# Patient Record
Sex: Female | Born: 1965
Health system: Southern US, Community
[De-identification: ages and names within clinical notes are randomized; demographics above are authoritative.]

## PROBLEM LIST (undated history)

## (undated) DIAGNOSIS — J45909 Unspecified asthma, uncomplicated: Secondary | ICD-10-CM

## (undated) DIAGNOSIS — R112 Nausea with vomiting, unspecified: Secondary | ICD-10-CM

## (undated) DIAGNOSIS — L509 Urticaria, unspecified: Secondary | ICD-10-CM

## (undated) DIAGNOSIS — R7303 Prediabetes: Secondary | ICD-10-CM

## (undated) DIAGNOSIS — M7672 Peroneal tendinitis, left leg: Secondary | ICD-10-CM

## (undated) DIAGNOSIS — J189 Pneumonia, unspecified organism: Secondary | ICD-10-CM

## (undated) DIAGNOSIS — Z9889 Other specified postprocedural states: Secondary | ICD-10-CM

## (undated) DIAGNOSIS — G2581 Restless legs syndrome: Secondary | ICD-10-CM

## (undated) DIAGNOSIS — J069 Acute upper respiratory infection, unspecified: Secondary | ICD-10-CM

## (undated) DIAGNOSIS — T783XXA Angioneurotic edema, initial encounter: Secondary | ICD-10-CM

## (undated) DIAGNOSIS — IMO0002 Reserved for concepts with insufficient information to code with codable children: Secondary | ICD-10-CM

## (undated) HISTORY — PX: ANKLE SURGERY: SHX546

## (undated) HISTORY — DX: Urticaria, unspecified: L50.9

## (undated) HISTORY — PX: JOINT REPLACEMENT: SHX530

## (undated) HISTORY — DX: Acute upper respiratory infection, unspecified: J06.9

## (undated) HISTORY — DX: Angioneurotic edema, initial encounter: T78.3XXA

## (undated) HISTORY — DX: Reserved for concepts with insufficient information to code with codable children: IMO0002

## (undated) HISTORY — DX: Unspecified asthma, uncomplicated: J45.909

## (undated) HISTORY — PX: LAPAROSCOPIC LYSIS OF ADHESIONS: SHX5905

## (undated) HISTORY — PX: TUBAL LIGATION: SHX77

## (undated) HISTORY — DX: Peroneal tendinitis, left leg: M76.72

## (undated) HISTORY — PX: HERNIA REPAIR: SHX51

## (undated) HISTORY — PX: BREAST EXCISIONAL BIOPSY: SUR124

---

## 2011-02-04 DIAGNOSIS — E782 Mixed hyperlipidemia: Secondary | ICD-10-CM

## 2011-02-04 HISTORY — DX: Mixed hyperlipidemia: E78.2

## 2019-06-05 HISTORY — PX: OTHER SURGICAL HISTORY: SHX169

## 2019-06-27 DIAGNOSIS — Z96652 Presence of left artificial knee joint: Secondary | ICD-10-CM | POA: Insufficient documentation

## 2019-06-27 DIAGNOSIS — M1712 Unilateral primary osteoarthritis, left knee: Secondary | ICD-10-CM | POA: Insufficient documentation

## 2019-06-27 HISTORY — DX: Presence of left artificial knee joint: Z96.652

## 2020-07-25 DIAGNOSIS — Z1231 Encounter for screening mammogram for malignant neoplasm of breast: Secondary | ICD-10-CM | POA: Diagnosis not present

## 2020-08-14 DIAGNOSIS — E782 Mixed hyperlipidemia: Secondary | ICD-10-CM | POA: Diagnosis not present

## 2020-08-14 DIAGNOSIS — E559 Vitamin D deficiency, unspecified: Secondary | ICD-10-CM | POA: Diagnosis not present

## 2020-08-20 DIAGNOSIS — E559 Vitamin D deficiency, unspecified: Secondary | ICD-10-CM | POA: Diagnosis not present

## 2020-08-20 DIAGNOSIS — G2581 Restless legs syndrome: Secondary | ICD-10-CM | POA: Diagnosis not present

## 2020-08-20 DIAGNOSIS — Z0000 Encounter for general adult medical examination without abnormal findings: Secondary | ICD-10-CM | POA: Diagnosis not present

## 2020-08-20 DIAGNOSIS — K279 Peptic ulcer, site unspecified, unspecified as acute or chronic, without hemorrhage or perforation: Secondary | ICD-10-CM | POA: Diagnosis not present

## 2020-08-20 DIAGNOSIS — J4520 Mild intermittent asthma, uncomplicated: Secondary | ICD-10-CM | POA: Diagnosis not present

## 2020-08-20 DIAGNOSIS — R300 Dysuria: Secondary | ICD-10-CM | POA: Diagnosis not present

## 2020-08-20 LAB — HM PAP SMEAR

## 2020-11-28 ENCOUNTER — Ambulatory Visit: Payer: BC Managed Care – PPO | Admitting: Family Medicine

## 2020-11-28 ENCOUNTER — Encounter: Payer: Self-pay | Admitting: Family Medicine

## 2020-11-28 ENCOUNTER — Other Ambulatory Visit: Payer: Self-pay

## 2020-11-28 VITALS — BP 122/84 | HR 90 | Temp 97.6°F | Ht 62.5 in | Wt 167.6 lb

## 2020-11-28 DIAGNOSIS — Z13220 Encounter for screening for lipoid disorders: Secondary | ICD-10-CM

## 2020-11-28 DIAGNOSIS — E559 Vitamin D deficiency, unspecified: Secondary | ICD-10-CM | POA: Insufficient documentation

## 2020-11-28 DIAGNOSIS — L821 Other seborrheic keratosis: Secondary | ICD-10-CM

## 2020-11-28 DIAGNOSIS — G2581 Restless legs syndrome: Secondary | ICD-10-CM

## 2020-11-28 DIAGNOSIS — N938 Other specified abnormal uterine and vaginal bleeding: Secondary | ICD-10-CM

## 2020-11-28 DIAGNOSIS — G4700 Insomnia, unspecified: Secondary | ICD-10-CM | POA: Diagnosis not present

## 2020-11-28 DIAGNOSIS — H01004 Unspecified blepharitis left upper eyelid: Secondary | ICD-10-CM | POA: Diagnosis not present

## 2020-11-28 DIAGNOSIS — K279 Peptic ulcer, site unspecified, unspecified as acute or chronic, without hemorrhage or perforation: Secondary | ICD-10-CM | POA: Insufficient documentation

## 2020-11-28 DIAGNOSIS — J4520 Mild intermittent asthma, uncomplicated: Secondary | ICD-10-CM

## 2020-11-28 DIAGNOSIS — M47816 Spondylosis without myelopathy or radiculopathy, lumbar region: Secondary | ICD-10-CM

## 2020-11-28 DIAGNOSIS — Z114 Encounter for screening for human immunodeficiency virus [HIV]: Secondary | ICD-10-CM

## 2020-11-28 HISTORY — DX: Restless legs syndrome: G25.81

## 2020-11-28 HISTORY — DX: Other specified abnormal uterine and vaginal bleeding: N93.8

## 2020-11-28 HISTORY — DX: Mild intermittent asthma, uncomplicated: J45.20

## 2020-11-28 HISTORY — DX: Other seborrheic keratosis: L82.1

## 2020-11-28 HISTORY — DX: Spondylosis without myelopathy or radiculopathy, lumbar region: M47.816

## 2020-11-28 HISTORY — DX: Vitamin D deficiency, unspecified: E55.9

## 2020-11-28 MED ORDER — PRAMIPEXOLE DIHYDROCHLORIDE 1 MG PO TABS
1.0000 mg | ORAL_TABLET | Freq: Every day | ORAL | 3 refills | Status: DC
Start: 2020-11-28 — End: 2021-11-27

## 2020-11-28 MED ORDER — TRAZODONE HCL 50 MG PO TABS: ORAL_TABLET | ORAL | 2 refills | Status: DC

## 2020-11-28 MED ORDER — ERYTHROMYCIN 5 MG/GM OP OINT: 1.0000 "application " | TOPICAL_OINTMENT | Freq: Every day | OPHTHALMIC | 2 refills | Status: DC

## 2020-11-28 NOTE — Patient Instructions (Signed)
Evening primrose oil capsules Soy products [place]

## 2020-11-28 NOTE — Progress Notes (Signed)
Theresa Gonzalez is a 55 y.o. female  Chief Complaint  Patient presents with  . Establish Care    Np here to manage medications.  Pt need refills     HPI: Theresa Gonzalez is a 55 y.o. female patient seen today to establish care with our office. Pt recently moved to the area. 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 She is in school for counseling. Married with 2 children and 7 grandchildren. She has RLS and is on mirapex. She needs a refill of this. Med is effective at controlling symptoms. No side effects.  She also has issues with staying asleep. Unrelated to legs. She wakes up for 2 hrs in the middle of the night. Unisom, melatonin, benadryl not effective.    History reviewed. No pertinent past medical history.  Past Surgical History:  Procedure Laterality Date  . HERNIA REPAIR    . knee replacement Left 06/2019  . TUBAL LIGATION      Social History   Socioeconomic History  . Marital status: Married    Spouse name: Not on file  . Number of children: Not on file  . Years of education: Not on file  . Highest education level: Not on file  Occupational History  . Not on file  Tobacco Use  . Smoking status: Never Smoker  . Smokeless tobacco: Never Used  Vaping Use  . Vaping Use: Never used  Substance and Sexual Activity  . Alcohol use: Yes  . Drug use: Never  . Sexual activity: Not on file  Other Topics Concern  . Not on file  Social History Narrative  . Not on file   Social Determinants of Health   Financial Resource Strain: Not on file  Food Insecurity: Not on file  Transportation Needs: Not on file  Physical Activity: Not on file  Stress: Not on file  Social Connections: Not on file  Intimate Partner Violence: Not on file    Family History  Problem Relation Age of Onset  . Diabetes Father   . Prostate cancer Father   . Diabetes Brother   . Heart attack Maternal Grandmother   . Heart attack Maternal Grandfather        There is no immunization history on file for this patient.  Outpatient Encounter Medications as of 11/28/2020  Medication Sig  . albuterol (VENTOLIN HFA) 108 (90 Base) MCG/ACT inhaler Inhale into the lungs.  . clindamycin (CLEOCIN) 300 MG capsule SMARTSIG:2 Capsule(s) By Mouth  . cyclobenzaprine (FLEXERIL) 10 MG tablet Take by mouth.  . EPINEPHrine 0.3 mg/0.3 mL IJ SOAJ injection Inject into the muscle.  Marland Kitchen LANSOPRAZOLE PO 15 mg.  . pramipexole (MIRAPEX) 1 MG tablet Take by mouth.  . triamcinolone cream (KENALOG) 0.1 % Apply topically 2 (two) times daily.   No facility-administered encounter medications on file as of 11/28/2020.     ROS: Pertinent positives and negatives noted in HPI. Remainder of ROS non-contributory   Allergies  Allergen Reactions  . Penicillins Rash and Shortness Of Breath  . Potassium Citrate Itching  . Ropinirole Shortness Of Breath  . Cetirizine Hcl   . Nabumetone Rash  . Nsaids Rash    Provider: Valere Dross [facility] - Allergy Description: NSAIDs CFM - Allergy Annotation: rash  . Sodium Hyaluronate Hives    Pulse 90   Temp 97.6 F (36.4 C) (Temporal)   Ht 5' 2.5" (1.588 m)   Wt 167 lb 9.6 oz (76 kg)  LMP 09/15/2020   SpO2 98%   BMI 30.17 kg/m   Wt Readings from Last 3 Encounters:  11/28/20 167 lb 9.6 oz (76 kg)   Temp Readings from Last 3 Encounters:  11/28/20 97.6 F (36.4 C) (Temporal)   BP Readings from Last 3 Encounters:  No data found for BP   Pulse Readings from Last 3 Encounters:  11/28/20 90     Physical Exam Constitutional:      General: She is not in acute distress.    Appearance: Normal appearance. She is not ill-appearing.  Neurological:     Mental Status: She is alert and oriented to person, place, and time.  Psychiatric:        Mood and Affect: Mood normal.        Behavior: Behavior normal.      A/P:  1. RLS (restless legs syndrome) - x 20 years, symptoms controlled Refill: - pramipexole (MIRAPEX) 1 MG  tablet; Take 1 tablet (1 mg total) by mouth at bedtime.  Dispense: 90 tablet; Refill: 3  2. Insomnia, unspecified type - no improvement with unisom, melatonin, benadryl Rx: - traZODone (DESYREL) 50 MG tablet; 1-2 tabs po qHS  Dispense: 60 tablet; Refill: 2 - f/u in 3-4 wks if no/minimal improvement Discussed plan and reviewed medications with patient, including risks, benefits, and potential side effects. Pt expressed understand. All questions answered.  3. Screening for lipid disorders - Lipid panel; Future  4. Blepharitis of left upper eyelid, unspecified type - baby wash scrubs Rx: - erythromycin ophthalmic ointment; Place 1 application into the left eye at bedtime.  Dispense: 3.5 g; Refill: 2 - x 2-4 wks  5. Screening for HIV without presence of risk factors - HIV Antibody (routine testing w rflx); Future   This visit occurred during the SARS-CoV-2 public health emergency.  Safety protocols were in place, including screening questions prior to the visit, additional usage of staff PPE, and extensive cleaning of exam room while observing appropriate contact time as indicated for disinfecting solutions.

## 2020-12-03 ENCOUNTER — Encounter: Payer: Self-pay | Admitting: Family Medicine

## 2020-12-03 NOTE — Telephone Encounter (Signed)
Spoke to patient and she states that she got the Sh[place] vaccine 12/02/20 @ 3:00 pm at the pharmacy.  Then about 4 pm she started with a slight cough that has gotten a little deeper and a raxh on her chin/neck.  She has taken Benadryl with some relief.  Advised patient to as the pharmacy what their recommendations were and if symptoms get worse to please go to Urgent Care or ER to be checked out.  She is agreeable to this plan.  Any recommendations for her? Please review and advise.  Thanks.  Dm/cma

## 2020-12-05 ENCOUNTER — Other Ambulatory Visit: Payer: Self-pay

## 2020-12-05 ENCOUNTER — Other Ambulatory Visit (INDEPENDENT_AMBULATORY_CARE_PROVIDER_SITE_OTHER): Payer: BC Managed Care – PPO

## 2020-12-05 DIAGNOSIS — Z114 Encounter for screening for human immunodeficiency virus [HIV]: Secondary | ICD-10-CM | POA: Diagnosis not present

## 2020-12-05 DIAGNOSIS — Z13220 Encounter for screening for lipoid disorders: Secondary | ICD-10-CM | POA: Diagnosis not present

## 2020-12-05 NOTE — Progress Notes (Signed)
Per orders of Dr. Barron Alvine pt is her for labs pt tolerated draw well.

## 2020-12-08 LAB — LDL CHOLESTEROL, DIRECT: Direct LDL: 103 mg/dL

## 2020-12-08 LAB — HIV ANTIBODY (ROUTINE TESTING W REFLEX): HIV 1&2 Ab, 4th Generation: NONREACTIVE

## 2020-12-08 LAB — LIPID PANEL
Cholesterol: 211 mg/dL — ABNORMAL HIGH (ref 0–200)
HDL: 50.9 mg/dL (ref >39.00)
NonHDL: 160.25
Total CHOL/HDL Ratio: 4
Triglycerides: 256 mg/dL — ABNORMAL HIGH (ref 0.0–149.0)
VLDL: 51.2 mg/dL — ABNORMAL HIGH (ref 0.0–40.0)

## 2020-12-19 ENCOUNTER — Other Ambulatory Visit: Payer: Self-pay | Admitting: Family Medicine

## 2020-12-19 ENCOUNTER — Encounter: Payer: Self-pay | Admitting: Family Medicine

## 2020-12-19 DIAGNOSIS — E782 Mixed hyperlipidemia: Secondary | ICD-10-CM

## 2020-12-19 MED ORDER — FENOFIBRATE 145 MG PO TABS: 145.0000 mg | ORAL_TABLET | Freq: Every day | ORAL | 3 refills | Status: DC

## 2020-12-20 ENCOUNTER — Other Ambulatory Visit: Payer: Self-pay | Admitting: Family Medicine

## 2020-12-20 DIAGNOSIS — G4700 Insomnia, unspecified: Secondary | ICD-10-CM

## 2020-12-26 DIAGNOSIS — H0101B Ulcerative blepharitis left eye, upper and lower eyelids: Secondary | ICD-10-CM | POA: Diagnosis not present

## 2020-12-26 DIAGNOSIS — H0101A Ulcerative blepharitis right eye, upper and lower eyelids: Secondary | ICD-10-CM | POA: Diagnosis not present

## 2021-01-12 ENCOUNTER — Ambulatory Visit (INDEPENDENT_AMBULATORY_CARE_PROVIDER_SITE_OTHER): Payer: BC Managed Care – PPO | Admitting: Allergy & Immunology

## 2021-01-12 ENCOUNTER — Other Ambulatory Visit: Payer: Self-pay

## 2021-01-12 ENCOUNTER — Encounter: Payer: Self-pay | Admitting: Allergy & Immunology

## 2021-01-12 VITALS — BP 132/70 | HR 80 | Temp 98.0°F | Resp 16 | Ht 62.5 in | Wt 165.8 lb

## 2021-01-12 DIAGNOSIS — J4520 Mild intermittent asthma, uncomplicated: Secondary | ICD-10-CM | POA: Diagnosis not present

## 2021-01-12 DIAGNOSIS — L299 Pruritus, unspecified: Secondary | ICD-10-CM | POA: Diagnosis not present

## 2021-01-12 DIAGNOSIS — T7800XD Anaphylactic reaction due to unspecified food, subsequent encounter: Secondary | ICD-10-CM | POA: Diagnosis not present

## 2021-01-12 DIAGNOSIS — J310 Chronic rhinitis: Secondary | ICD-10-CM | POA: Diagnosis not present

## 2021-01-12 MED ORDER — LEVOCETIRIZINE DIHYDROCHLORIDE 5 MG PO TABS: 5.0000 mg | ORAL_TABLET | Freq: Every evening | ORAL | 3 refills | Status: DC

## 2021-01-12 NOTE — Progress Notes (Signed)
NEW PATIENT  Date of Service/Encounter:  01/12/21  Consult requested by: Overton Mam, DO   Assessment:   Mild intermittent asthma, uncomplicated   Chronic rhinitis   Anaphylactic shock due to food - with negative testing (confirming the peanut, tree nut, and seafood allergy history of blood work)  Pruritus - gave Vanicream samples  Plan/Recommendations:   1. Mild intermittent asthma, uncomplicated - Lung testing look great today. - I do not think that you need a controller medication since you are not using your albuterol frequently. - Continue with albuterol 2 to 4 puffs every 4-6 hours as needed.  2. Chronic rhinitis - Testing today showed: negative to the entire panel - Copy of test results provided.  - Avoidance measures provided. - Start taking: Xyzal (levocetirizine) 5mg  tablet once daily (this will help with your itching as well)  - You can use an extra dose of the antihistamine, if needed, for breakthrough symptoms.   3. Anaphylactic shock due to food - Testing was negative to everything tested. - Copy of test results provided. - Because of the severity of your reactions to peanuts, tree nuts, and seafood, we are going to confirm these with blood work. - EpiPen is up-to-date. - We are also getting some labs to rule out weird causes of itching/hives. - We we will call you in 1 to 2 weeks with the results of the testing. - We have a a lot of pieces to put together before we come up with a good management plan.  She has a family reunion in [place].  - Samples of Vanicream provided today. - Recommended daily emollient use.   5. Return in about 4 weeks (around 02/09/2021).     This note in its entirety was forwarded to the Provider who requested this consultation.  Subjective:   Theresa Gonzalez is a 55 y.o. female presenting today for evaluation of  Chief Complaint  Patient presents with   Allergies    Allergy occurrences without  eating things she knows she's allergic to. Has an Epi pen    Theresa Gonzalez has a history of the following: Patient Active Problem List   Diagnosis Date Noted   DUB (dysfunctional uterine bleeding) 11/28/2020   Facet arthropathy, lumbar 11/28/2020   Mild intermittent asthma without complication 11/28/2020   PUD (peptic ulcer disease) 11/28/2020   RLS (restless legs syndrome) 11/28/2020   SK (seborrheic keratosis) 11/28/2020   Vitamin D insufficiency 11/28/2020   Localized osteoarthritis of left knee 06/27/2019   Mixed hyperlipidemia 02/04/2011    History obtained from: chart review and patient.  Theresa Gonzalez was referred by Ethelda Chick, DO.     Theresa Gonzalez is a 55 y.o. female presenting for an evaluation of possible food allergies .  She has a sore throat since she had a shingles shot around 4 months ago. She does her best to avoid peanuts, tree nuts, and any type of seafood. She has had issues over the last two months and she is suddenly coughing "up a lung". She has issues with steak resulting in coughing. On Sunday, she had strawberries and oatmeal dozens of times, but she went into a coughing fit. She treated with 3 Benadryls and Pepcids.   She does break out with a rash to shrimp. She does have an up to date EpiPen. Shrimp has been an issues for a number of years since 2012 or so. This is when everything "blew up". She had testing performed in 2008  or so. This was done in [place] and she does not remember the name of the  2012, she was in an airplane where peanut butter crackers were opened and she had a coughing fit. She has done her best to avoid peanuts and tree nuts. She has been reading labels etc. The smell itself can set her off. At this point, she even reacts to cross contamination episodes at restaurants. Only the shrimp causes hives, everything else is just coughing explosively. She does have some throat swelling that feels like sometime has pressure on her throat. She  never has stomach pain with this.   All of the testing thus far has been prick testing (only once in 2008 or so).  She never had lab work done.  She has several medication allergies as well.  These are mostly centered on urticaria.   Asthma/Respiratory Symptom History: She does have a history of asthma.  She has been well controlled for four years unless it rains a lot. She has been on controller medications but they have been expensive. She has been on Symbicort in the past. This is the only one that she has been on. She was on this around 2008 or so.  Her albuterol typically expires before she has to get another one. She has no nighttime coughing or wheezing noted.   Allergic Rhinitis Symptom History: She does have some allergic rhinitis symptoms. She has been told that she has allergies in her left eye. She saw an ophthalmologist who diagnosed it as belpharitis and treated with eye drops.   Otherwise, there is no history of other atopic diseases, including stinging insect allergies, eczema, or contact dermatitis. There is no significant infectious history. Vaccinations are up to date.    Past Medical History: Patient Active Problem List   Diagnosis Date Noted   DUB (dysfunctional uterine bleeding) 11/28/2020   Facet arthropathy, lumbar 11/28/2020   Mild intermittent asthma without complication 11/28/2020   PUD (peptic ulcer disease) 11/28/2020   RLS (restless legs syndrome) 11/28/2020   SK (seborrheic keratosis) 11/28/2020   Vitamin D insufficiency 11/28/2020   Localized osteoarthritis of left knee 06/27/2019   Mixed hyperlipidemia 02/04/2011    Medication List:  Allergies as of 01/12/2021       Reactions   Penicillins Rash, Shortness Of Breath   Potassium Citrate Itching   Ropinirole Shortness Of Breath   Cetirizine Hcl    Nabumetone Rash   Nsaids Rash   Provider: Valere Dross [facility] - Allergy Description: NSAIDs CFM - Allergy Annotation: rash   Sodium Hyaluronate Hives         Medication List        Accurate as of January 12, 2021 11:06 AM. If you have any questions, ask your nurse or doctor.          albuterol 108 (90 Base) MCG/ACT inhaler Commonly known as: VENTOLIN HFA Inhale into the lungs.   clindamycin 300 MG capsule Commonly known as: CLEOCIN SMARTSIG:2 Capsule(s) By Mouth   cyclobenzaprine 10 MG tablet Commonly known as: FLEXERIL Take by mouth.   EPINEPHrine 0.3 mg/0.3 mL Soaj injection Commonly known as: EPI-PEN Inject into the muscle.   erythromycin ophthalmic ointment Place 1 application into the left eye at bedtime.   fenofibrate 145 MG tablet Commonly known as: TRICOR Take 1 tablet (145 mg total) by mouth daily.   LANSOPRAZOLE PO 15 mg.   pramipexole 1 MG tablet Commonly known as: MIRAPEX Take 1 tablet (1 mg total) by mouth at  bedtime.   traZODone 50 MG tablet Commonly known as: DESYREL TAKE 1 TO 2 TABLETS BY MOUTH AT BEDTIME   triamcinolone cream 0.1 % Commonly known as: KENALOG Apply topically 2 (two) times daily.        Birth History: non-contributory  Developmental History: non-contributory  Past Surgical History: Past Surgical History:  Procedure Laterality Date   HERNIA REPAIR     knee replacement Left 06/2019   TUBAL LIGATION       Family History: Family History  Problem Relation Age of Onset   Diabetes Father    Prostate cancer Father    Diabetes Brother    Heart attack Maternal Grandmother    Heart attack Maternal Grandfather    Allergic rhinitis Neg Hx    Asthma Neg Hx    Eczema Neg Hx    Urticaria Neg Hx      Social History: Lucianne lives at home with her husband.  She lives in an apartment that was built in 1999.  They recently moved here from [facility].  There is carpeting throughout the home.  She does not have any mold or mildew damage in the home.  She has electric heating and central cooling.  There are no animals inside or outside of the home.  There are no dust  mite covers on the bedding.  There is no tobacco exposure.  She currently works as a Consulting civil engineer.  She is not exposed to fumes, chemicals, or dust.  She does not use a HEPA filter.  They do not live near an interstate or industrial area.   Review of Systems  Constitutional: Negative.  Negative for fever, malaise/fatigue and weight loss.  HENT: Negative.  Negative for congestion, ear discharge and ear pain.   Eyes:  Negative for pain, discharge and redness.  Respiratory:  Positive for cough. Negative for sputum production, shortness of breath and wheezing.   Cardiovascular: Negative.  Negative for chest pain and palpitations.  Gastrointestinal:  Negative for abdominal pain, heartburn, nausea and vomiting.  Skin: Negative.  Negative for itching and rash.  Neurological:  Negative for dizziness and headaches.  Endo/Heme/Allergies:  Positive for environmental allergies. Does not bruise/bleed easily.       Positive for food allergies.      Objective:   Blood pressure 132/70, pulse 80, temperature 98 F (36.7 C), temperature source Temporal, resp. rate 16, height 5' 2.5" (1.588 m), weight 165 lb 12.8 oz (75.2 kg), SpO2 98 %. Body mass index is 29.84 kg/m.   Physical Exam:   Physical Exam Constitutional:      Appearance: She is well-developed.  HENT:     Head: Normocephalic and atraumatic.     Right Ear: Tympanic membrane, ear canal and external ear normal. No drainage, swelling or tenderness. Tympanic membrane is not injected, scarred, erythematous, retracted or bulging.     Left Ear: Tympanic membrane, ear canal and external ear normal. No drainage, swelling or tenderness. Tympanic membrane is not injected, scarred, erythematous, retracted or bulging.     Nose: No nasal deformity, septal deviation, mucosal edema or rhinorrhea.     Right Turbinates: Enlarged and swollen.     Left Turbinates: Enlarged and swollen.     Right Sinus: No maxillary sinus tenderness or frontal sinus tenderness.      Left Sinus: No maxillary sinus tenderness or frontal sinus tenderness.     Mouth/Throat:     Mouth: Mucous membranes are not pale and not dry.  Pharynx: Uvula midline.  Eyes:     General:        Right eye: No discharge.        Left eye: No discharge.     Conjunctiva/sclera: Conjunctivae normal.     Right eye: Right conjunctiva is not injected. No chemosis.    Left eye: Left conjunctiva is not injected. No chemosis.    Pupils: Pupils are equal, round, and reactive to light.  Cardiovascular:     Rate and Rhythm: Normal rate and regular rhythm.     Heart sounds: Normal heart sounds.  Pulmonary:     Effort: Pulmonary effort is normal. No tachypnea, accessory muscle usage or respiratory distress.     Breath sounds: Normal breath sounds. No wheezing, rhonchi or rales.     Comments: Moving air well in all lung fields. Chest:     Chest wall: No tenderness.  Abdominal:     Tenderness: There is no abdominal tenderness. There is no guarding or rebound.  Lymphadenopathy:     Head:     Right side of head: No submandibular, tonsillar or occipital adenopathy.     Left side of head: No submandibular, tonsillar or occipital adenopathy.     Cervical: No cervical adenopathy.  Skin:    General: Skin is warm.     Capillary Refill: Capillary refill takes less than 2 seconds.     Coloration: Skin is not pale.     Findings: No abrasion, erythema, petechiae or rash. Rash is not papular, urticarial or vesicular.     Comments: Multiple moles over her entire body.  Neurological:     Mental Status: She is alert.  Psychiatric:        Behavior: Behavior is cooperative.     Diagnostic studies:   Spirometry: results normal (FEV1: 2.26L, FVC: 2.68L, FEV1/FVC: 84%).    Spirometry consistent with normal pattern.    Allergy Studies:     Airborne Adult Perc - 01/12/21 1033     Time Antigen Placed 1030    Allergen Manufacturer Greer    Location Back    Number of Test 59    Panel 1 Select     1. Control-Buffer 50% Glycerol Negative    2. Control-Histamine 1 mg/ml 2+    3. Albumin saline Negative    4. Bahia Negative    5. French Southern TerritoriesBermuda Negative    6. Johnson Negative    7. Kentucky Blue Negative    8. Meadow Fescue Negative    9. Perennial Rye Negative    10. Sweet Vernal Negative    11. Timothy Negative    12. Cocklebur Negative    13. Burweed Marshelder Negative    14. Ragweed, short Negative    15. Ragweed, Giant Negative    16. Plantain,  English Negative    17. Lamb's Quarters Negative    18. Sheep Sorrell Negative    19. Rough Pigweed Negative    20. Marsh Elder, Rough Negative    21. Mugwort, Common Negative    22. Ash mix Negative    23. Birch mix Negative    24. Beech American Negative    25. Box, Elder Negative    26. Cedar, red Negative    27. Cottonwood, Guinea-BissauEastern Negative    28. Elm mix Negative    29. Hickory Negative    30. Maple mix Negative    31. Oak, Guinea-Bissau[place]astern mix Negative    32. Pecan Pollen Negative    33. Beazer HomesPine mix  Negative    34. Sycamore Eastern Negative    35. Walnut, Black Pollen Negative    36. Alternaria alternata Negative    37. Cladosporium Herbarum Negative    38. Aspergillus mix Negative    39. Penicillium mix Negative    40. Bipolaris sorokiniana (Helminthosporium) Negative    41. Drechslera spicifera (Curvularia) Negative    42. Mucor plumbeus Negative    43. Fusarium moniliforme Negative    44. Aureobasidium pullulans (pullulara) Negative    45. Rhizopus oryzae Negative    46. Botrytis cinera Negative    47. Epicoccum nigrum Negative    48. Phoma betae Negative    49. Candida Albicans Negative    50. Trichophyton mentagrophytes Negative    51. Mite, D Farinae  5,000 AU/ml Negative    52. Mite, D Pteronyssinus  5,000 AU/ml Negative    53. Cat Hair 10,000 BAU/ml Negative    54.  Dog Epithelia Negative    55. Mixed Feathers Negative    56. Horse Epithelia Negative    57. Cockroach, German Negative    58. Mouse Negative    59.  Tobacco Leaf Negative             Food Adult Perc - 01/12/21 1000     Time Antigen Placed 1030    Allergen Manufacturer Greer    Location Back    Number of allergen test 32    1. Peanut Negative    8. Shellfish Mix Negative    9. Fish Mix Negative    10. Cashew Negative    11. Pecan Food Negative    12. Walnut Food Negative    13. Almond Negative    14. Hazelnut Negative    15. [place] nut Negative    16. Coconut Negative    17. Pistachio Negative    18. Catfish Negative    19. Bass Negative    20. Trout Negative    21. Tuna Negative    22. Salmon Negative    23. Flounder Negative    24. Codfish Negative    25. Shrimp Negative    26. Crab Negative    27. Lobster Negative    28. Oyster Negative    29. Scallops Negative    31. Oat  Negative    34. Rice Negative    37. Pork Negative    40. Beef Negative    41. Lamb Negative    42. Tomato Negative    59. Peach Negative    64. Chocolate/Cacao bean Negative    67. Cinnamon Negative             Allergy testing results were read and interpreted by myself, documented by clinical staff.         Malachi Bonds, MD [facility]

## 2021-01-12 NOTE — Patient Instructions (Addendum)
1. Mild intermittent asthma, uncomplicated - Lung testing look great today. - I do not think that you need a controller medication since you are not using your albuterol frequently. - Continue with albuterol 2 to 4 puffs every 4-6 hours as needed.  2. Chronic rhinitis - Testing today showed: negative to the entire panel - Copy of test results provided.  - Avoidance measures provided. - Start taking: Xyzal (levocetirizine) 5mg  tablet once daily (this will help with your itching as well)  - You can use an extra dose of the antihistamine, if needed, for breakthrough symptoms.   3. Anaphylactic shock due to food - Testing was negative to everything tested. - Copy of test results provided. - Because of the severity of your reactions to peanuts, tree nuts, and seafood, we are going to confirm these with blood work. - EpiPen is up-to-date. - We are also getting some labs to rule out weird causes of itching/hives. - We we will call you in 1 to 2 weeks with the results of the testing. - We have a a lot of pieces to put together before we come up with a good management plan.  4. Return in about 4 weeks (around 02/09/2021).    Please inform 04/11/2021 of any Emergency Department visits, hospitalizations, or changes in symptoms. Call us before going to the ED for breathing or allergy symptoms since we might be able to fit you in for a sick visit. Feel free to contact us anytime with any questions, problems, or concerns.  It was a pleasure to meet you today! You were an ABSOLUTE HOOT!   Websites that have reliable patient information: 1. American Academy of Asthma, Allergy, and Immunology: www.aaaai.org 2. Food Allergy Research and Education (FARE): foodallergy.org 3. Mothers of Asthmatics: http://www.asthmacommunitynetwork.org 4. American College of Allergy, Asthma, and Immunology: www.acaai.org   COVID-19 Vaccine Information can be found at:  [place] For questions related to vaccine distribution or appointments, please email [email or web address] or call [phone].   We realize that you might be concerned about having an allergic reaction to the COVID19 vaccines. To help with that concern, WE ARE OFFERING THE COVID19 VACCINES IN OUR OFFICE! Ask the front desk for dates!     "Like" [phone] on Facebook and Instagram for our latest updates!      A healthy democracy works best when [place] participate! Make sure you are registered to vote! If you have moved or changed any of your contact information, you will need to get this updated before voting!  In some cases, you MAY be able to register to vote online: [employer]

## 2021-01-14 ENCOUNTER — Encounter: Payer: Self-pay | Admitting: Allergy & Immunology

## 2021-01-23 LAB — CBC WITH DIFFERENTIAL
Basophils Absolute: 0.1 10*3/uL (ref 0.0–0.2)
Basos: 1 %
EOS (ABSOLUTE): 0.2 10*3/uL (ref 0.0–0.4)
Eos: 3 %
Hematocrit: 39.6 % (ref 34.0–46.6)
Hemoglobin: 13.6 g/dL (ref 11.1–15.9)
Immature Grans (Abs): 0 10*3/uL (ref 0.0–0.1)
Immature Granulocytes: 0 %
Lymphocytes Absolute: 1.7 10*3/uL (ref 0.7–3.1)
Lymphs: 23 %
MCH: 29.1 pg (ref 26.6–33.0)
MCHC: 34.3 g/dL (ref 31.5–35.7)
MCV: 85 fL (ref 79–97)
Monocytes Absolute: 0.5 10*3/uL (ref 0.1–0.9)
Monocytes: 6 %
Neutrophils Absolute: 5.1 10*3/uL (ref 1.4–7.0)
Neutrophils: 67 %
RBC: 4.67 x10E6/uL (ref 3.77–5.28)
RDW: 13.5 % (ref 11.7–15.4)
WBC: 7.6 10*3/uL (ref 3.4–10.8)

## 2021-01-23 LAB — ALLERGY PANEL 18, NUT MIX GROUP
Allergen Coconut IgE: <0.1 kU/L
F020-IgE Almond: <0.1 kU/L
F202-IgE Cashew Nut: <0.1 kU/L
Hazelnut (Filbert) IgE: <0.1 kU/L
Peanut IgE: <0.1 kU/L
Pecan Nut IgE: <0.1 kU/L
Sesame Seed IgE: <0.1 kU/L

## 2021-01-23 LAB — CHRONIC URTICARIA: cu index: 3.9 (ref <10)

## 2021-01-23 LAB — CMP14+EGFR
ALT: 18 IU/L (ref 0–32)
AST: 16 IU/L (ref 0–40)
Albumin/Globulin Ratio: 2.7 — ABNORMAL HIGH (ref 1.2–2.2)
Albumin: 4.6 g/dL (ref 3.8–4.9)
Alkaline Phosphatase: 76 IU/L (ref 44–121)
BUN/Creatinine Ratio: 8 — ABNORMAL LOW (ref 9–23)
BUN: 7 mg/dL (ref 6–24)
Bilirubin Total: 0.3 mg/dL (ref 0.0–1.2)
CO2: 24 mmol/L (ref 20–29)
Calcium: 9.6 mg/dL (ref 8.7–10.2)
Chloride: 100 mmol/L (ref 96–106)
Creatinine, Ser: 0.87 mg/dL (ref 0.57–1.00)
Globulin, Total: 1.7 g/dL (ref 1.5–4.5)
Glucose: 123 mg/dL — ABNORMAL HIGH (ref 65–99)
Potassium: 3.7 mmol/L (ref 3.5–5.2)
Sodium: 146 mmol/L — ABNORMAL HIGH (ref 134–144)
Total Protein: 6.3 g/dL (ref 6.0–8.5)
eGFR: 78 mL/min/{1.73_m2} (ref >59)

## 2021-01-23 LAB — ALLERGY PANEL 19, SEAFOOD GROUP
Allergen Salmon IgE: <0.1 kU/L
Catfish: <0.1 kU/L
Codfish IgE: <0.1 kU/L
F023-IgE Crab: <0.1 kU/L
F080-IgE Lobster: <0.1 kU/L
Shrimp IgE: <0.1 kU/L
Tuna: <0.1 kU/L

## 2021-01-23 LAB — SEDIMENTATION RATE: Sed Rate: 2 mm/hr (ref 0–40)

## 2021-01-23 LAB — C-REACTIVE PROTEIN: CRP: 1 mg/L (ref 0–10)

## 2021-01-23 LAB — TRYPTASE: Tryptase: 4.2 ug/L (ref 2.2–13.2)

## 2021-01-23 LAB — ANTINUCLEAR ANTIBODIES, IFA: ANA Titer 1: NEGATIVE

## 2021-02-06 ENCOUNTER — Ambulatory Visit: Payer: BC Managed Care – PPO

## 2021-02-06 ENCOUNTER — Ambulatory Visit
Admission: EM | Admit: 2021-02-06 | Discharge: 2021-02-06 | Disposition: A | Discharge status: Home / Self Care | Payer: BC Managed Care – PPO | Attending: Emergency Medicine | Admitting: Emergency Medicine

## 2021-02-06 ENCOUNTER — Other Ambulatory Visit: Payer: Self-pay

## 2021-02-06 DIAGNOSIS — R059 Cough, unspecified: Secondary | ICD-10-CM | POA: Diagnosis not present

## 2021-02-06 DIAGNOSIS — J4520 Mild intermittent asthma, uncomplicated: Secondary | ICD-10-CM

## 2021-02-06 MED ORDER — PREDNISONE 20 MG PO TABS: 40.0000 mg | ORAL_TABLET | Freq: Every day | ORAL | 0 refills | Status: AC

## 2021-02-06 MED ORDER — BENZONATATE 200 MG PO CAPS: 200.0000 mg | ORAL_CAPSULE | Freq: Three times a day (TID) | ORAL | 0 refills | Status: AC | PRN

## 2021-02-06 NOTE — ED Triage Notes (Signed)
Pt presents with a cough and states it feels like an asthmatic cough.   States she has done two at home COVID test that were negative. Pt states she was in and out of hot and the cold temperature.

## 2021-02-06 NOTE — ED Provider Notes (Signed)
[facility]    CSN: [id number] Arrival date & time: 02/06/21  0808      History   Chief Complaint Chief Complaint  Patient presents with   Appointment   Cough    HPI Theresa Gonzalez is a 55 y.o. female presenting today for evaluation of a cough.  Reports that she has had a cough for approximately 1 week.  Mild associated headache and rhinorrhea which is normal for her.  She does report COVID exposure, but reports 2 at home test negative on different days.  Reports cough feels like an asthma cough that she has noticed wheezing with cough, but denies any wheezing with breathing.  Reports that she has been in and out of varying temperatures recently and feels this often will flare her asthma.  Using albuterol inhaler.  HPI  Past Medical History:  Diagnosis Date   Angio-edema    Asthma    Recurrent upper respiratory infection (URI)    Urticaria     Patient Active Problem List   Diagnosis Date Noted   DUB (dysfunctional uterine bleeding) 11/28/2020   Facet arthropathy, lumbar 11/28/2020   Mild intermittent asthma without complication 11/28/2020   PUD (peptic ulcer disease) 11/28/2020   RLS (restless legs syndrome) 11/28/2020   SK (seborrheic keratosis) 11/28/2020   Vitamin D insufficiency 11/28/2020   Localized osteoarthritis of left knee 06/27/2019   Mixed hyperlipidemia 02/04/2011    Past Surgical History:  Procedure Laterality Date   HERNIA REPAIR     knee replacement Left 06/2019   TUBAL LIGATION      OB History   No obstetric history on file.      Home Medications    Prior to Admission medications   Medication Sig Start Date End Date Taking? Authorizing Provider  benzonatate (TESSALON) 200 MG capsule Take 1 capsule (200 mg total) by mouth 3 (three) times daily as needed for up to 7 days for cough. 02/06/21 02/13/21 Yes Menno Vanbergen C, PA-C  predniSONE (DELTASONE) 20 MG tablet Take 2 tablets (40 mg total) by mouth daily with breakfast for 5 days.  02/06/21 02/11/21 Yes Yachet Mattson C, PA-C  albuterol (VENTOLIN HFA) 108 (90 Base) MCG/ACT inhaler Inhale into the lungs. 08/20/20   [provider]  EPINEPHrine 0.3 mg/0.3 mL IJ SOAJ injection Inject into the muscle. 08/20/20   [provider]  fenofibrate (TRICOR) 145 MG tablet Take 1 tablet (145 mg total) by mouth daily. 12/19/20   Overton Mam, DO  LANSOPRAZOLE PO 15 mg. 06/22/19   [provider]  levocetirizine (XYZAL) 5 MG tablet Take 1 tablet (5 mg total) by mouth every evening. 01/12/21   Alfonse Spruce, MD  pramipexole (MIRAPEX) 1 MG tablet Take 1 tablet (1 mg total) by mouth at bedtime. 11/28/20   CiriglianoJearld Lesch, DO    Family History Family History  Problem Relation Age of Onset   Diabetes Father    Prostate cancer Father    Diabetes Brother    Heart attack Maternal Grandmother    Heart attack Maternal Grandfather    Allergic rhinitis Neg Hx    Asthma Neg Hx    Eczema Neg Hx    Urticaria Neg Hx     Social History Social History   Tobacco Use   Smoking status: Never   Smokeless tobacco: Never  Vaping Use   Vaping Use: Never used  Substance Use Topics   Alcohol use: Yes   Drug use: Never  Allergies   Penicillins, Potassium citrate, Ropinirole, Cetirizine hcl, Nabumetone, Nsaids, and Sodium hyaluronate   Review of Systems Review of Systems  Constitutional:  Negative for activity change, appetite change, chills, fatigue and fever.  HENT:  Positive for congestion, rhinorrhea and sore throat. Negative for ear pain, sinus pressure and trouble swallowing.   Eyes:  Negative for discharge and redness.  Respiratory:  Positive for cough and chest tightness. Negative for shortness of breath.   Cardiovascular:  Negative for chest pain.  Gastrointestinal:  Negative for abdominal pain, diarrhea, nausea and vomiting.  Musculoskeletal:  Negative for myalgias.  Skin:  Negative for rash.  Neurological:  Negative for dizziness,  light-headedness and headaches.    Physical Exam Triage Vital Signs ED Triage Vitals  Enc Vitals Group     BP      Pulse      Resp      Temp      Temp src      SpO2      Weight      Height      Head Circumference      Peak Flow      Pain Score      Pain Loc      Pain Edu?      Excl. in GC?    No data found.  Updated Vital Signs BP (!) 141/88 (BP Location: Right Arm)   Pulse 71   Temp 97.9 F (36.6 C) (Oral)   Resp 20   SpO2 96%  Recheck blood pressure and was 126/83 Visual Acuity Right Eye Distance:   Left Eye Distance:   Bilateral Distance:    Right Eye Near:   Left Eye Near:    Bilateral Near:     Physical Exam Vitals and nursing note reviewed.  Constitutional:      Appearance: She is well-developed.     Comments: No acute distress  HENT:     Head: Normocephalic and atraumatic.     Ears:     Comments: Bilateral ears without tenderness to palpation of external auricle, tragus and mastoid, EAC's without erythema or swelling, TM's with good bony landmarks and cone of light. Non erythematous.      Nose: Nose normal.     Mouth/Throat:     Comments: Oral mucosa pink and moist, no tonsillar enlargement or exudate. Posterior pharynx patent and nonerythematous, no uvula deviation or swelling. Normal phonation.   Eyes:     Conjunctiva/sclera: Conjunctivae normal.  Cardiovascular:     Rate and Rhythm: Normal rate.  Pulmonary:     Effort: Pulmonary effort is normal. No respiratory distress.     Comments: Breathing comfortably at rest, CTABL, no wheezing, rales or other adventitious sounds auscultated  Abdominal:     General: There is no distension.  Musculoskeletal:        General: Normal range of motion.     Cervical back: Neck supple.  Skin:    General: Skin is warm and dry.  Neurological:     Mental Status: She is alert and oriented to person, place, and time.     UC Treatments / Results  Labs (all labs ordered are listed, but only abnormal  results are displayed) Labs Reviewed  NOVEL CORONAVIRUS, NAA    EKG   Radiology No results found.  Procedures Procedures (including critical care time)  Medications Ordered in UC Medications - No data to display  Initial Impression / Assessment and Plan / UC Course  I have  reviewed the triage vital signs and the nursing notes.  Pertinent labs & imaging results that were available during my care of the patient were reviewed by me and considered in my medical decision making (see chart for details).    Cough/bronchospasm-COVID PCR pending for confirmation, treating with prednisone 40 mg x 5 days to treat for any underlying asthma flare along with continuing albuterol, continue symptomatic and supportive care of congestion and postnasal drainage, Tessalon for cough, deferring antibiotic therapy at this time.  Discussed strict return precautions. Patient verbalized understanding and is agreeable with plan.  Final Clinical Impressions(s) / UC Diagnoses   Final diagnoses:  Cough  Mild intermittent asthma without complication     Discharge Instructions      COVID PCR pending for confirmation Prednisone 40 mg daily x5 days-take with food and earlier in the day if possible, drink plenty of water Tessalon every 8 hours for cough Continued allergy medicines Albuterol inhaler 1 to 2 puffs every 4-6 hours as needed Follow-up if not improving or worsening     ED Prescriptions     Medication Sig Dispense Auth. Provider   benzonatate (TESSALON) 200 MG capsule Take 1 capsule (200 mg total) by mouth 3 (three) times daily as needed for up to 7 days for cough. 28 capsule Mylan Schwarz C, PA-C   predniSONE (DELTASONE) 20 MG tablet Take 2 tablets (40 mg total) by mouth daily with breakfast for 5 days. 10 tablet Momodou Consiglio, Austell C, PA-C      PDMP not reviewed this encounter.   Lew Dawes, [place] 02/06/21 [phone]

## 2021-02-06 NOTE — Discharge Instructions (Addendum)
COVID PCR pending for confirmation Prednisone 40 mg daily x5 days-take with food and earlier in the day if possible, drink plenty of water Tessalon every 8 hours for cough Continued allergy medicines Albuterol inhaler 1 to 2 puffs every 4-6 hours as needed Follow-up if not improving or worsening

## 2021-02-08 LAB — SARS-COV-2, NAA 2 DAY TAT

## 2021-02-08 LAB — NOVEL CORONAVIRUS, NAA: SARS-CoV-2, NAA: NOT DETECTED

## 2021-02-26 ENCOUNTER — Encounter: Payer: Self-pay | Admitting: Family Medicine

## 2021-02-26 ENCOUNTER — Ambulatory Visit: Payer: BC Managed Care – PPO | Admitting: Family

## 2021-02-26 ENCOUNTER — Other Ambulatory Visit: Payer: Self-pay | Admitting: Family Medicine

## 2021-02-26 ENCOUNTER — Other Ambulatory Visit: Payer: Self-pay

## 2021-02-26 ENCOUNTER — Ambulatory Visit (INDEPENDENT_AMBULATORY_CARE_PROVIDER_SITE_OTHER): Payer: BC Managed Care – PPO | Admitting: Family Medicine

## 2021-02-26 ENCOUNTER — Telehealth: Payer: Self-pay | Admitting: *Deleted

## 2021-02-26 VITALS — BP 124/76 | HR 82 | Temp 98.1°F | Resp 12

## 2021-02-26 DIAGNOSIS — T7800XA Anaphylactic reaction due to unspecified food, initial encounter: Secondary | ICD-10-CM | POA: Insufficient documentation

## 2021-02-26 DIAGNOSIS — J4520 Mild intermittent asthma, uncomplicated: Secondary | ICD-10-CM | POA: Diagnosis not present

## 2021-02-26 DIAGNOSIS — K219 Gastro-esophageal reflux disease without esophagitis: Secondary | ICD-10-CM | POA: Insufficient documentation

## 2021-02-26 DIAGNOSIS — J310 Chronic rhinitis: Secondary | ICD-10-CM

## 2021-02-26 DIAGNOSIS — T7800XD Anaphylactic reaction due to unspecified food, subsequent encounter: Secondary | ICD-10-CM

## 2021-02-26 HISTORY — DX: Chronic rhinitis: J31.0

## 2021-02-26 HISTORY — DX: Gastro-esophageal reflux disease without esophagitis: K21.9

## 2021-02-26 MED ORDER — FAMOTIDINE 20 MG PO TABS: ORAL_TABLET | ORAL | 1 refills | Status: DC

## 2021-02-26 MED ORDER — FLUTICASONE PROPIONATE 50 MCG/ACT NA SUSP: NASAL | 5 refills | Status: DC

## 2021-02-26 MED ORDER — DEXLANSOPRAZOLE 60 MG PO CPDR: DELAYED_RELEASE_CAPSULE | ORAL | 1 refills | Status: DC

## 2021-02-26 NOTE — Telephone Encounter (Signed)
PA has been submitted through CoverMyMeds for Dexilant 60 and is currently pending approval/denial.

## 2021-02-26 NOTE — Patient Instructions (Addendum)
Asthma Continue albuterol 2 puffs once every 4 hours as needed for cough or wheeze You may use albuterol 2 puffs 5 to 15 minutes before if any to decrease cough and wheeze  Chronic rhinitis Continue an antihistamine once a day as needed for runny nose or itch. Remember to rotate to a different antihistamine about every 3 months. Some examples of over the counter antihistamines include Zyrtec (cetirizine), Xyzal (levocetirizine), Allegra (fexofenadine), and Claritin (loratidine).  Begin Flonase 2 sprays in each nostril once a day as needed for stuffy nose. In the right nostril, point the applicator out toward the right ear. In the left nostril, point the applicator out toward the left ear Begin saline nasal rinses as needed for nasal symptoms. Use this before any medicated nasal sprays for best result  Reflux Continue dietary and lifestyle modifications as listed below Begin Dexilant 60 mg once a day. This will replace lansoprazole Begin famotidine 20 mg twice a day to control reflux  Food Continue to avoid all nuts and seafood for now. Your testing was negative. If you are interested in a food challenge let us know.  In case of an allergic reaction, take Benadryl 50 mg every 4 hours, and if life-threatening symptoms occur, inject with EpiPen 0.3 mg. If your symptoms re-occur, begin a journal of events that occurred for up to 6 hours before your symptoms began including foods and beverages consumed, soaps or perfumes you had contact with, and medications.   Call the clinic if this treatment plan is not working well for you  Follow up in 1 month or sooner if needed.

## 2021-02-26 NOTE — Progress Notes (Signed)
[address] [place] [address] Dept: [phone]  FOLLOW UP NOTE  Patient ID: Theresa Gonzalez, female    DOB: 06-24-1966  Age: 55 y.o. MRN: [id number] Date of Office Visit: 02/26/2021  Assessment  Chief Complaint: Asthma and Food Intolerance  HPI Theresa Gonzalez is a 55 year old female who presents to the clinic for follow-up visit.  She was last seen in this clinic on 01/12/2021 for evaluation of asthma, chronic rhinitis, pruritus and food allergy to seafood and nuts.  In the interim, she presented to the emergency department for cough on 02/06/2021 at which time she had a negative COVID swab.  At today's visit, she reports that she continues to experience reactions that usually occur about 10 to 20 minutes after eating, however, they occur more frequently in the morning hours.  She reports symptoms begin with forceful dry cough occasionally leading to vomiting if she has just eaten.  In addition to cough, she reports feeling pressure in her throat. She notes that it feels like " someone pushing on both sides of her trachea".  She reports these have been occurring about once every 2 weeks and have been increasing in frequency over the last several months.  She takes Benadryl and her symptoms resolved in between 30 to 90 minutes.  She does not experience concomitant integumentary or gastrointestinal symptoms.  She denies pruritus.  Asthma is reported as moderately well controlled with shortness of breath with vigorous activity and occasional wheeze while having a coughing reaction.  She continues albuterol during the coughing reaction only with mild relief of symptoms.  Chronic rhinitis is reported as poorly controlled with clear rhinorrhea, nasal congestion, occasional sneeze, and copious postnasal drainage with frequent throat clearing.  She continues Xyzal 5 mg once a day and is not currently using Flonase or nasal saline rinses.  She did have negative environmental allergy testing at her last  visit to this clinic.  She reports reflux has been moderately well controlled with only occasional heartburn for which she takes lansoprazole 30 mg once a day.  She does report a history of poorly controlled reflux before beginning lansoprazole.  She continues to avoid fish, shellfish, peanuts, and tree nuts with no accidental ingestion or EpiPen use since her last visit to this clinic.  She did have negative skin testing and negative labs to shellfish, fish, peanuts, and tree nuts at her last visit to this clinic.  She did have a comprehensive work-up including largely negative lab work at her last visit to this clinic.  The alpha gal lab was not collected at that time.  She is also requesting thyroid lab evaluation.  She denies symptoms of thyroid disease at this time.  Her current medications are listed in the chart.   Drug Allergies:  Allergies  Allergen Reactions   Fish Allergy Cough, Hives and Shortness Of Breath   Peanut (Diagnostic) Cough and Shortness Of Breath   Penicillins Rash and Shortness Of Breath   Potassium Citrate Itching   Ropinirole Shortness Of Breath   Shrimp (Diagnostic) Cough, Hives and Shortness Of Breath   Cetirizine Hcl    Nabumetone Rash   Nsaids Rash    Provider: Valere Dross CFM - Allergy Description: NSAIDs CFM - Allergy Annotation: rash   Sodium Hyaluronate Hives   Other     Tree nuts causes a really bad cough    Physical Exam: BP 124/76 (BP Location: Right Arm, Patient Position: Sitting, Cuff Size: Normal)   Pulse 82  Temp 98.1 F (36.7 C) (Temporal)   Resp 12   SpO2 98%    Physical Exam Vitals reviewed.  Constitutional:      Appearance: Normal appearance.  HENT:     Head: Normocephalic and atraumatic.     Right Ear: Tympanic membrane normal.     Left Ear: Tympanic membrane normal.     Nose:     Comments: Bilateral nares edematous and pale with clear nasal drainage noted.  Pharynx is slightly erythematous with cobblestone appearance.  Ears  normal.  Eyes normal. Eyes:     Conjunctiva/sclera: Conjunctivae normal.  Cardiovascular:     Rate and Rhythm: Normal rate and regular rhythm.     Heart sounds: Normal heart sounds. No murmur heard. Pulmonary:     Effort: Pulmonary effort is normal.     Breath sounds: Normal breath sounds.     Comments: Lungs clear to auscultation Musculoskeletal:        General: Normal range of motion.     Cervical back: Normal range of motion and neck supple.  Skin:    General: Skin is warm and dry.  Neurological:     Mental Status: She is alert and oriented to person, place, and time.  Psychiatric:        Mood and Affect: Mood normal.        Behavior: Behavior normal.        Thought Content: Thought content normal.        Judgment: Judgment normal.    Diagnostics: FVC 2.83, FEV1 2.32.  Predicted FVC 3.04, predicted FEV1 2.42.  Spirometry indicates normal ventilatory function.  Assessment and Plan: 1. Mild intermittent asthma, uncomplicated   2. Chronic rhinitis   3. Anaphylactic shock due to food, subsequent encounter   4. Gastroesophageal reflux disease, unspecified whether esophagitis present     Meds ordered this encounter  Medications   dexlansoprazole (DEXILANT) 60 MG capsule    Sig: Take 1 capsule once a day. This will replace lansoprazole    Dispense:  90 capsule    Refill:  1    Please dispense 90 day supply.   famotidine (PEPCID) 20 MG tablet    Sig: Take one tablet twice a day to control reflux    Dispense:  180 tablet    Refill:  1    Please dispense 90 day supply   fluticasone (FLONASE) 50 MCG/ACT nasal spray    Sig: 2 sprays per nostril once a day as needed for stuffy nose.    Dispense:  16 g    Refill:  5    Patient Instructions  Asthma Continue albuterol 2 puffs once every 4 hours as needed for cough or wheeze You may use albuterol 2 puffs 5 to 15 minutes before if any to decrease cough and wheeze  Chronic rhinitis Continue an antihistamine once a day as  needed for runny nose or itch. Remember to rotate to a different antihistamine about every 3 months. Some examples of over the counter antihistamines include Zyrtec (cetirizine), Xyzal (levocetirizine), Allegra (fexofenadine), and Claritin (loratidine).  Begin Flonase 2 sprays in each nostril once a day as needed for stuffy nose. In the right nostril, point the applicator out toward the right ear. In the left nostril, point the applicator out toward the left ear Begin saline nasal rinses as needed for nasal symptoms. Use this before any medicated nasal sprays for best result  Reflux Continue dietary and lifestyle modifications as listed below Begin Dexilant 60 mg once  a day. This will replace lansoprazole Begin famotidine 20 mg twice a day to control reflux  Food Continue to avoid all nuts and seafood for now. Your testing was negative. If you are interested in a food challenge let us know.  In case of an allergic reaction, take Benadryl 50 mg every 4 hours, and if life-threatening symptoms occur, inject with EpiPen 0.3 mg. If your symptoms re-occur, begin a journal of events that occurred for up to 6 hours before your symptoms began including foods and beverages consumed, soaps or perfumes you had contact with, and medications.   Call the clinic if this treatment plan is not working well for you  Follow up in 1 month or sooner if needed.   Return in about 4 weeks (around 03/26/2021), or if symptoms worsen or fail to improve.    Thank you for the opportunity to care for this patient.  Please do not hesitate to contact me with questions.  Thermon Leyland, FNP [facility]

## 2021-02-27 ENCOUNTER — Ambulatory Visit (INDEPENDENT_AMBULATORY_CARE_PROVIDER_SITE_OTHER): Payer: BC Managed Care – PPO

## 2021-02-27 ENCOUNTER — Encounter: Payer: Self-pay | Admitting: Family Medicine

## 2021-02-27 ENCOUNTER — Ambulatory Visit: Payer: BC Managed Care – PPO | Admitting: Family Medicine

## 2021-02-27 VITALS — HR 78 | Temp 97.8°F | Ht 62.5 in | Wt 165.4 lb

## 2021-02-27 DIAGNOSIS — Z23 Encounter for immunization: Secondary | ICD-10-CM | POA: Diagnosis not present

## 2021-02-27 DIAGNOSIS — J310 Chronic rhinitis: Secondary | ICD-10-CM | POA: Diagnosis not present

## 2021-02-27 DIAGNOSIS — R7303 Prediabetes: Secondary | ICD-10-CM

## 2021-02-27 DIAGNOSIS — M25571 Pain in right ankle and joints of right foot: Secondary | ICD-10-CM

## 2021-02-27 DIAGNOSIS — M8560 Other cyst of bone, unspecified site: Secondary | ICD-10-CM | POA: Insufficient documentation

## 2021-02-27 DIAGNOSIS — K219 Gastro-esophageal reflux disease without esophagitis: Secondary | ICD-10-CM

## 2021-02-27 DIAGNOSIS — G8929 Other chronic pain: Secondary | ICD-10-CM

## 2021-02-27 DIAGNOSIS — E782 Mixed hyperlipidemia: Secondary | ICD-10-CM | POA: Diagnosis not present

## 2021-02-27 DIAGNOSIS — R739 Hyperglycemia, unspecified: Secondary | ICD-10-CM | POA: Diagnosis not present

## 2021-02-27 HISTORY — DX: Prediabetes: R73.03

## 2021-02-27 LAB — HEMOGLOBIN A1C: Hgb A1c MFr Bld: 6 % (ref 4.6–6.5)

## 2021-02-27 IMAGING — DX DG ANKLE COMPLETE 3+V*R*
3 series · 3 of 3 positions shown · non-contrast
Comparison: None.

CLINICAL DATA: Chronic episodic right ankle pain.  Injury in [I6].

EXAM:
RIGHT ANKLE - COMPLETE 3+ VIEW

[ankle ap]
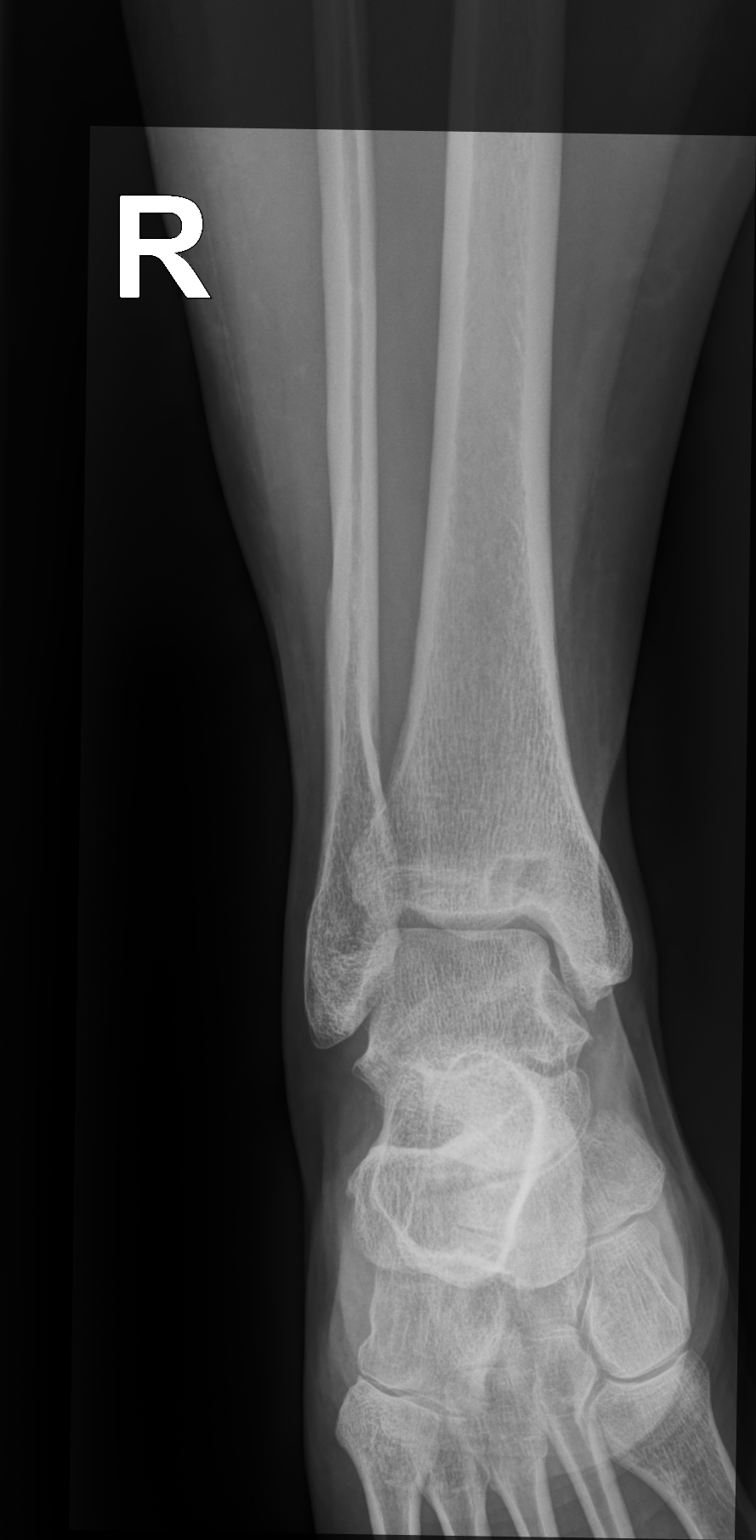

[ankle mlo]
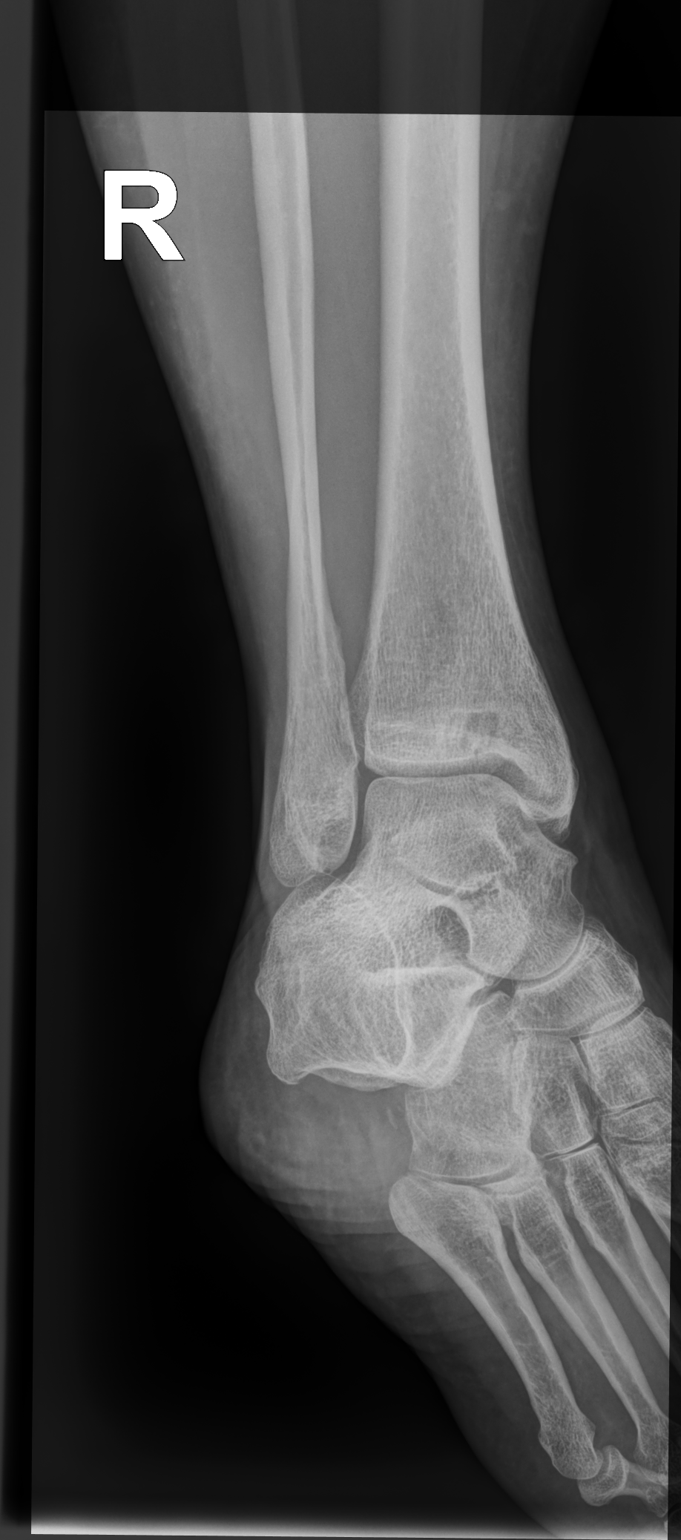

[ankle lat]
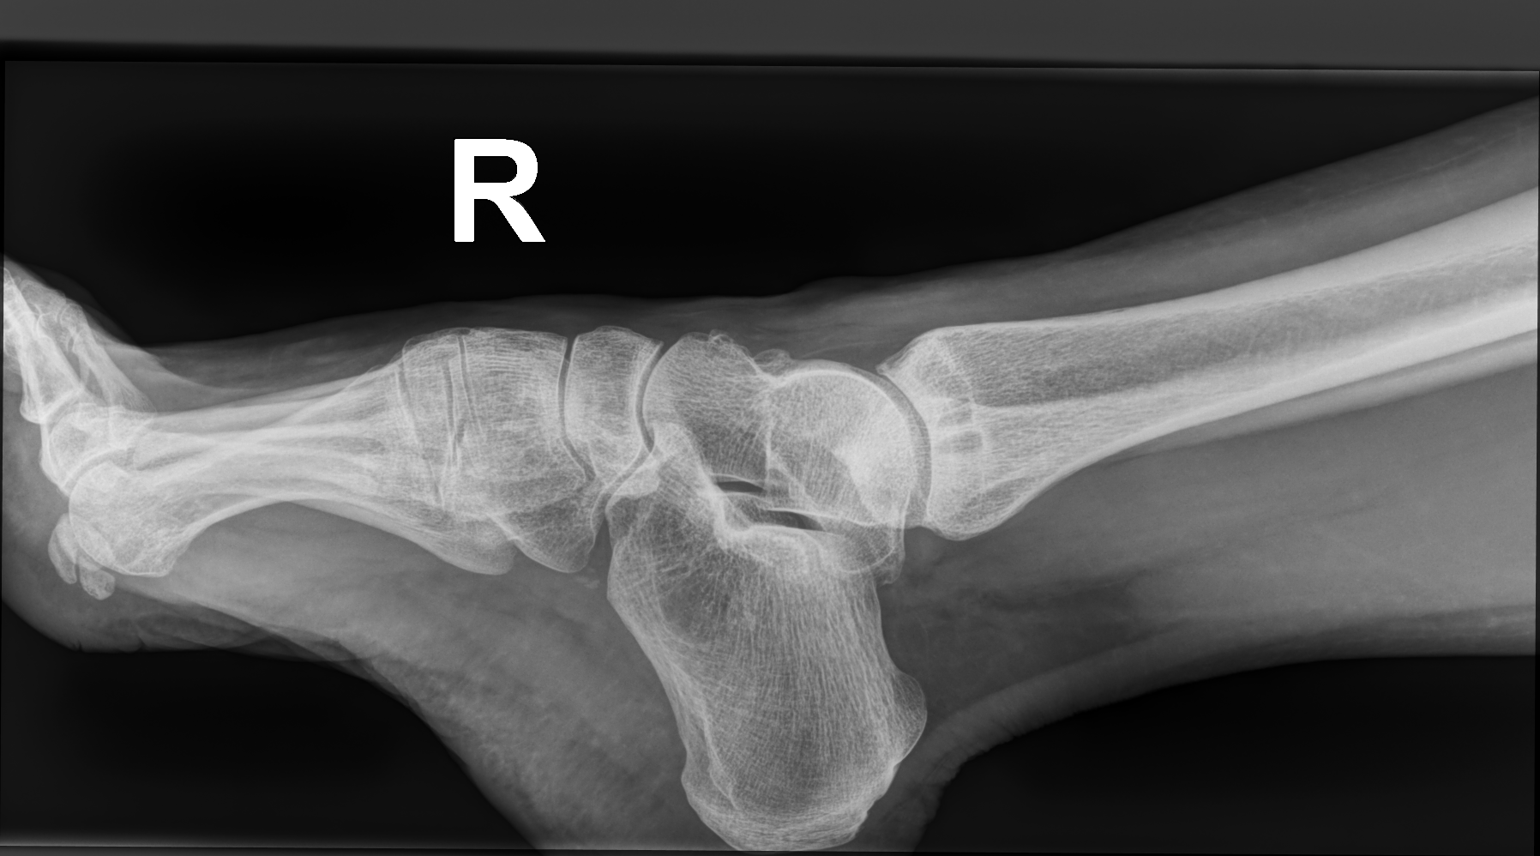

[3 of 3 positions shown; findings below may reference images not displayed]

FINDINGS: 1 cm peripherally sclerotic lucency in the medial aspect of the
tibial plafond. Smaller lucency in the medial aspect of the talar
dome. The ankle mortise is preserved. No evidence of acute or healed
fracture. Mild spurring of the dorsal talus in the region the
capsular insertion. Posterior subtalar joint is preserved. No
erosion or bone destruction. No ankle joint effusion. No focal soft
tissue abnormality.
IMPRESSION: 1. Peripherally sclerotic lucent lesions in the medial aspect of the
tibial plafond and smaller lucency in the medial aspect of the talar
dome. Findings may be subchondral cyst due to underlying chondral
defects, consider further assessment with MRI. These no aggressive
characteristics by radiograph.
2. Mild degenerative spurring of the dorsal talus in the region the
capsular insertion.

## 2021-02-27 NOTE — Telephone Encounter (Signed)
PA has been approved for Dexilant and has been sent electronically to the patient's pharmacy.

## 2021-02-27 NOTE — Progress Notes (Signed)
[facility] [address] [place] [address] Dept: [phone] Dept Fax: [phone]  Transfer of Care Office Visit  Subjective:    Patient ID: Theresa Gonzalez, female    DOB: 1965-12-26, 55 y.o..   MRN: [id number]  Chief Complaint  Patient presents with   Establish Care    TOC- f/u meds. C/o pain in RT ankle (fracture/ligament 2 years ago).      History of Present Illness:  Patient is in today to establish care. Theresa Gonzalez is from [facility]. [facility] [place], [place]). 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 Se has been married for 37 years. She has 2 children (36, 31) and 7 grandchildren. She denies tobacco or drug use and drinks alcohol rarely. 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  Theresa Gonzalez has a history of mild, intermittent asthma that is triggered by exposure to cold air. She very occasionally use an albuterol inhaler for this.  Theresa Gonzalez has a history of chronic rhinitis and some cough. She had though for quite some time that she had certain food allergies. However, she has very recently seen an allergist who has done testing and did not find evidence for theses food allergies. She notes the allergist has become concerned about chronic rhinitis and GERD as a possible cause for her symptoms. She is now on both Dexilant and Pepcid for GERD and fluticasone and Xyzal for rhinitis. This was just started in the past few days.  Theresa Gonzalez has a history of restless legs and is managed on Mirapex and trazodone with good results.  Theresa Gonzalez has a history of dyslipidemia, mainly with elevated triglycerides and is managed on Tricor.  Theresa Gonzalez notes that she injured her right ankle in ~ 2016, jumping into a pool. She had a hyperdorsiflexion of the ankle joint. She note sin more recent years,  she has periodic pain in the antero lateral aspect of the ankle, but also occasionally in the Achilles area.  Past Medical History: Patient Active Problem List   Diagnosis Date Noted   Hyperglycemia 02/27/2021   Chronic rhinitis 02/26/2021   Anaphylactic shock due to adverse food reaction 02/26/2021   Gastroesophageal reflux disease 02/26/2021   DUB (dysfunctional uterine bleeding) 11/28/2020   Facet arthropathy, lumbar 11/28/2020   Mild intermittent asthma, uncomplicated 11/28/2020   PUD (peptic ulcer disease) 11/28/2020   RLS (restless legs syndrome) 11/28/2020   SK (seborrheic keratosis) 11/28/2020   Vitamin D insufficiency 11/28/2020   Localized osteoarthritis of left knee 06/27/2019   Mixed hyperlipidemia 02/04/2011   Past Surgical History:  Procedure Laterality Date   HERNIA REPAIR Bilateral    knee replacement Left 06/2019   LAPAROSCOPIC LYSIS OF ADHESIONS     TUBAL LIGATION     Family History  Problem Relation Age of Onset   Hypertension Mother    Hyperlipidemia Mother    Retinitis pigmentosa Mother    Heart disease Father    Diabetes Father    Prostate cancer Father    Diabetes Brother    Retinitis pigmentosa Maternal Aunt    Heart attack Maternal Grandmother    Stroke Maternal Grandfather    Heart attack Maternal Grandfather    Heart disease Paternal Grandmother    Epilepsy Paternal Grandmother    Diabetes Paternal Grandfather    Allergic rhinitis Neg Hx    Asthma Neg Hx  Eczema Neg Hx    Urticaria Neg Hx    Outpatient Medications Prior to Visit  Medication Sig Dispense Refill   albuterol (VENTOLIN HFA) 108 (90 Base) MCG/ACT inhaler Inhale into the lungs.     dexlansoprazole (DEXILANT) 60 MG capsule Take 1 capsule once a day. This will replace lansoprazole 90 capsule 1   EPINEPHrine 0.3 mg/0.3 mL IJ SOAJ injection Inject into the muscle.     famotidine (PEPCID) 20 MG tablet Take one tablet twice a day to control reflux 180 tablet 1   fenofibrate  (TRICOR) 145 MG tablet Take 1 tablet (145 mg total) by mouth daily. 90 tablet 3   fluticasone (FLONASE) 50 MCG/ACT nasal spray 2 sprays per nostril once a day as needed for stuffy nose. 16 g 5   levocetirizine (XYZAL) 5 MG tablet Take 1 tablet (5 mg total) by mouth every evening. 30 tablet 3   pramipexole (MIRAPEX) 1 MG tablet Take 1 tablet (1 mg total) by mouth at bedtime. 90 tablet 3   traZODone (DESYREL) 50 MG tablet Take 100 mg by mouth at bedtime as needed.     No facility-administered medications prior to visit.   Allergies  Allergen Reactions   Fish Allergy Cough, Hives and Shortness Of Breath   Peanut (Diagnostic) Cough and Shortness Of Breath   Penicillins Rash and Shortness Of Breath   Potassium Citrate Itching   Ropinirole Shortness Of Breath   Shrimp (Diagnostic) Cough, Hives and Shortness Of Breath   Cetirizine Hcl    Nabumetone Rash   Nsaids Rash    Provider: Valere Dross CFM - Allergy Description: NSAIDs CFM - Allergy Annotation: rash   Sodium Hyaluronate Hives   Other     Tree nuts causes a really bad cough     Objective:   Today's Vitals   02/27/21 0858  Pulse: 78  Temp: 97.8 F (36.6 C)  TempSrc: Temporal  SpO2: 96%  Weight: 165 lb 6.4 oz (75 kg)  Height: 5' 2.5" (1.588 m)   Body mass index is 29.77 kg/m.   General: Well developed, well nourished. No acute distress. Psych: Alert and oriented. Normal mood and affect.  Health Maintenance Due  Topic Date Due   Pneumococcal Vaccine 33-43 Years old (1 - PCV) Never done   Hepatitis C Screening  Never done   INFLUENZA VACCINE  02/02/2021     Imaging: Right ankle x-ray- No fracture or osteophytes noted.  Lab results: Lab Results  Component Value Date   CHOL 211 (H) 12/05/2020   HDL 50.90 12/05/2020   LDLDIRECT 103.0 12/05/2020   TRIG 256.0 (H) 12/05/2020   CHOLHDL 4 12/05/2020   CMP Latest Ref Rng & Units 01/12/2021  Glucose 65 - 99 mg/dL 262(M)  BUN 6 - 24 mg/dL 7  Creatinine 3.55 - 9.74 mg/dL  1.63  Sodium 845 - 364 mmol/L 146(H)  Potassium 3.5 - 5.2 mmol/L 3.7  Chloride 96 - 106 mmol/L 100  CO2 20 - 29 mmol/L 24  Calcium 8.7 - 10.2 mg/dL 9.6  Total Protein 6.0 - 8.5 g/dL 6.3  Total Bilirubin 0.0 - 1.2 mg/dL 0.3  Alkaline Phos 44 - 121 IU/L 76  AST 0 - 40 IU/L 16  ALT 0 - 32 IU/L 18    Assessment & Plan:   1. Chronic pain of right ankle I offered a referral to physical therapy. Theresa Gonzalez would prefer to hold off on that for now. Despite her stated allergy to NSAIDs, she notes she does  tolerate ibuprofen. I suggested she might try taking this twice a day for a week, along with daily hot soaks with ROM exercises.  - DG Ankle Complete Right; Future  2. Chronic rhinitis Continue Xyzal and Flonase. She will follow up with her allergist. She will have lab work as ordered by Dr. Dellis Anes completed today.  3. Gastroesophageal reflux disease, unspecified whether esophagitis present Continue PPI and famotidine. Also, following with allergy.  4. Mixed hyperlipidemia On Tricor. We will follow up with fasting labs at her next visit.  5. Hyperglycemia Mild hyperglycemia noted on last CMP. I will check an HbA1c.  - Hemoglobin A1c  Loyola Mast, MD

## 2021-03-01 ENCOUNTER — Encounter: Payer: Self-pay | Admitting: Family Medicine

## 2021-03-01 DIAGNOSIS — M25571 Pain in right ankle and joints of right foot: Secondary | ICD-10-CM

## 2021-03-01 DIAGNOSIS — G8929 Other chronic pain: Secondary | ICD-10-CM

## 2021-03-02 DIAGNOSIS — T7800XD Anaphylactic reaction due to unspecified food, subsequent encounter: Secondary | ICD-10-CM | POA: Diagnosis not present

## 2021-03-02 NOTE — Telephone Encounter (Signed)
OK. Instead of Dexilant, lets try omeprazole 40 mg once a day. Take 30 minutes before your first meal. If no improvement suggest referral to GI specialist.

## 2021-03-06 LAB — ALPHA-GAL PANEL
Allergen Lamb IgE: <0.1 kU/L
Beef IgE: <0.1 kU/L
IgE (Immunoglobulin E), Serum: 9 IU/mL (ref 6–495)
O215-IgE Alpha-Gal: <0.1 kU/L
Pork IgE: <0.1 kU/L

## 2021-03-06 LAB — THYROID CASCADE PROFILE: TSH: 2.78 u[IU]/mL (ref 0.450–4.500)

## 2021-03-06 NOTE — Progress Notes (Signed)
Can you please let this patient know that her TSH was within the normal range and her alpha gal panel was negative (red meat allergy due to tick bite). Please find out how she is doing on the new acid reflux medication? Has her cough improved at all?

## 2021-03-06 NOTE — Progress Notes (Signed)
Thank you :)

## 2021-03-12 ENCOUNTER — Ambulatory Visit
Admission: RE | Admit: 2021-03-12 | Discharge: 2021-03-12 | Disposition: A | Discharge status: Home / Self Care | Payer: BC Managed Care – PPO | Source: Ambulatory Visit | Attending: Family Medicine | Admitting: Family Medicine

## 2021-03-12 ENCOUNTER — Other Ambulatory Visit: Payer: Self-pay

## 2021-03-12 DIAGNOSIS — G8929 Other chronic pain: Secondary | ICD-10-CM

## 2021-03-12 DIAGNOSIS — M19071 Primary osteoarthritis, right ankle and foot: Secondary | ICD-10-CM | POA: Diagnosis not present

## 2021-03-12 DIAGNOSIS — M25571 Pain in right ankle and joints of right foot: Secondary | ICD-10-CM

## 2021-03-12 DIAGNOSIS — R600 Localized edema: Secondary | ICD-10-CM | POA: Diagnosis not present

## 2021-03-12 IMAGING — MR MR ANKLE*R* W/O CM
4 of 6 series · 12 of 40 positions shown · non-contrast
Comparison: Radiograph [DATE]

CLINICAL DATA: Chronic ankle pain. Abnormal plain x-ray with
lucency in distal tibia.

EXAM:
MRI OF THE RIGHT ANKLE WITHOUT CONTRAST
TECHNIQUE: Multiplanar, multisequence MR imaging of the ankle was performed. No
intravenous contrast was administered.

[Series 3: PD fat-sat · axial · right · 3.0mm · 0.25mm/px · z∈[-19,+72]mm · 3 of 33 slices shown]
[im 5/33]
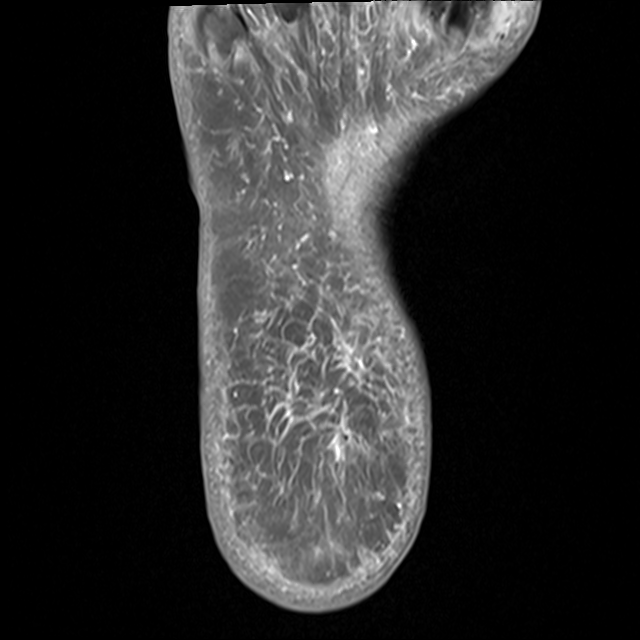
[im 19/33]
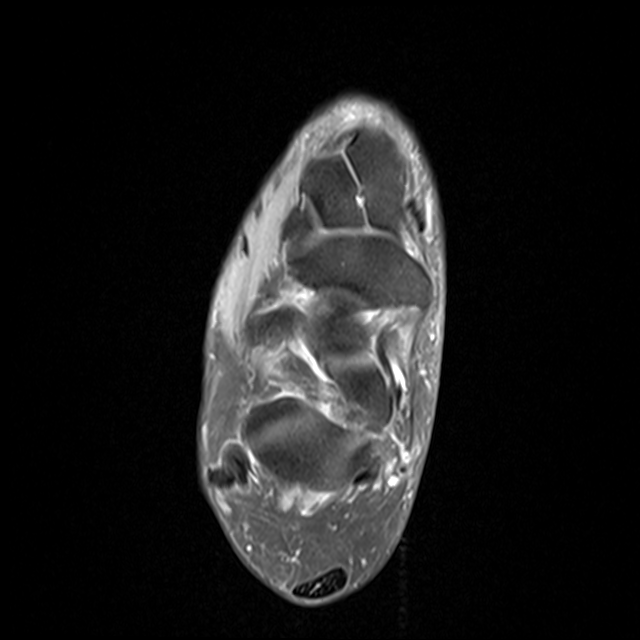
[im 28/33]
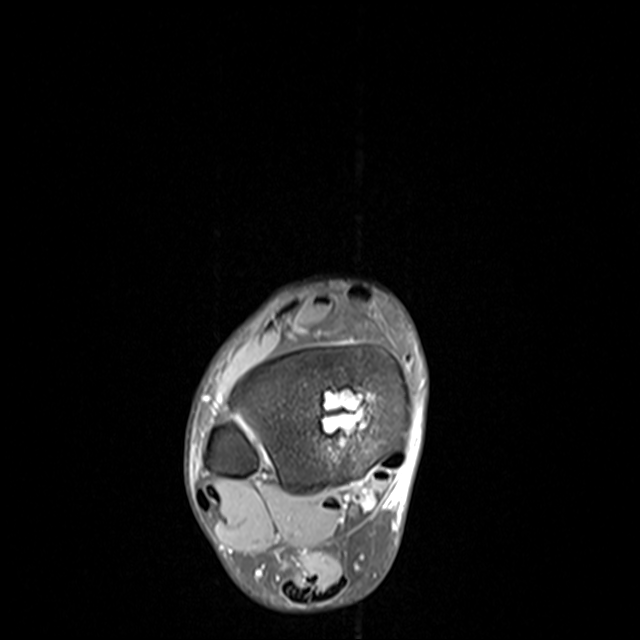

[Series 4: T2 fat-sat · axial · right · 3.0mm · 0.25mm/px · z∈[-19,+72]mm · 3 of 33 slices shown (1 of 2)]
[im 5/33]
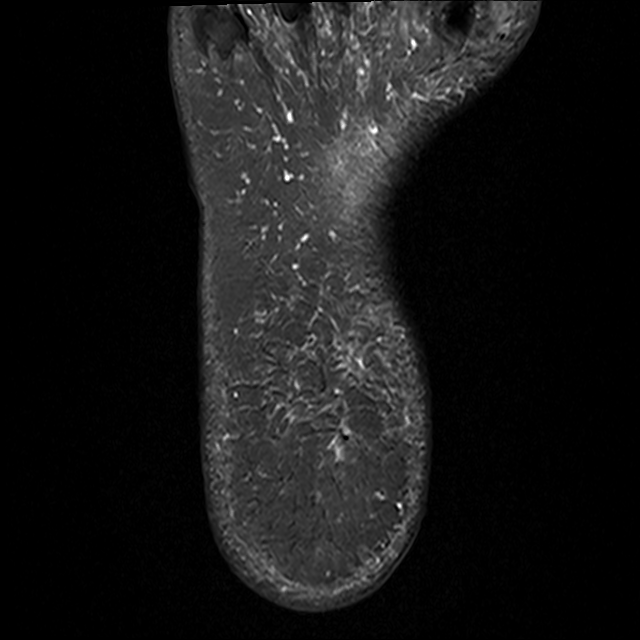
[im 19/33]
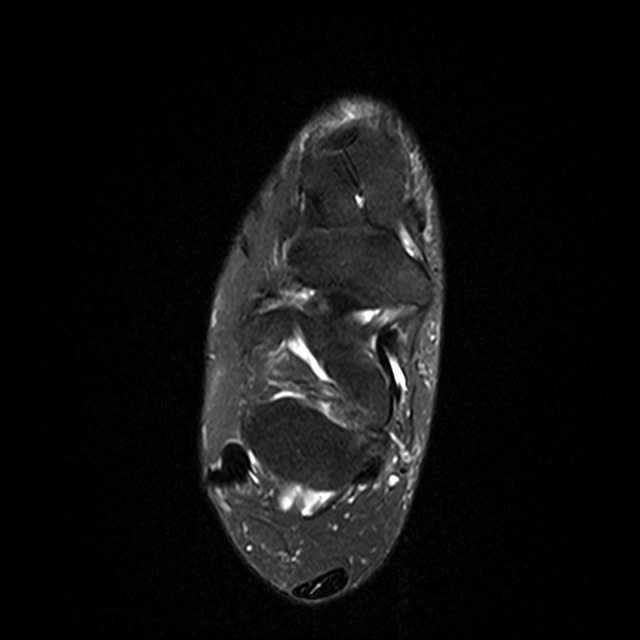
[im 28/33]
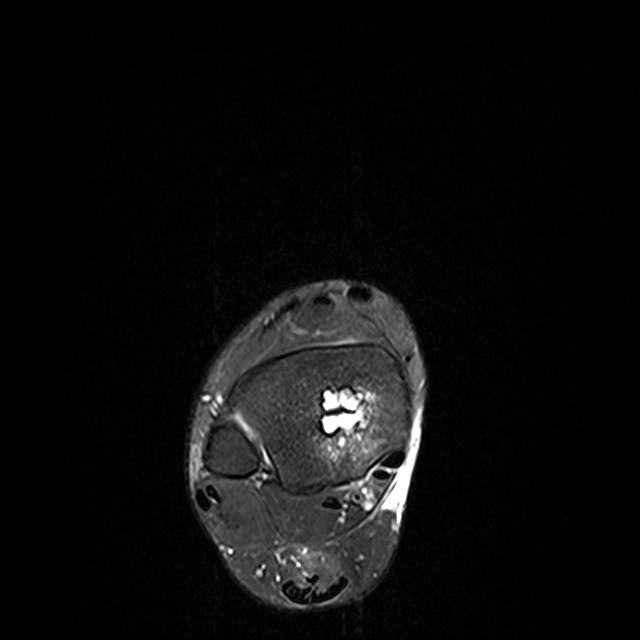

[Series 5: T1 · sagittal · right · 4.0mm · 0.27mm/px · 3 of 22 slices shown]
[im 1/22]
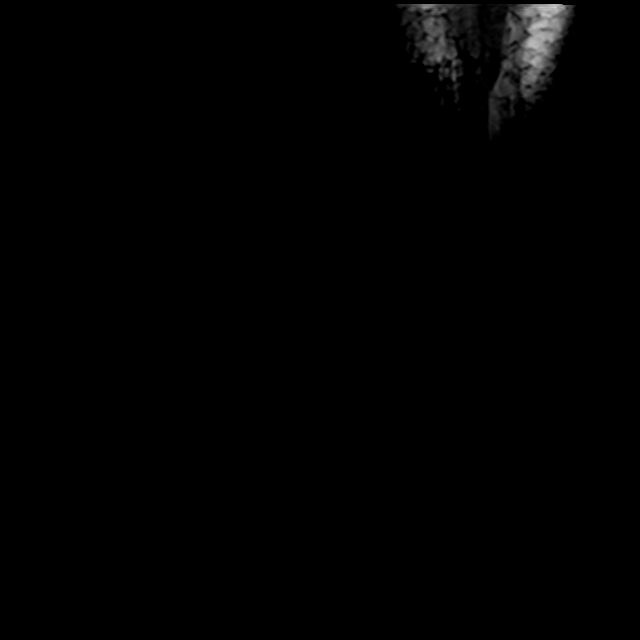
[im 11/22]
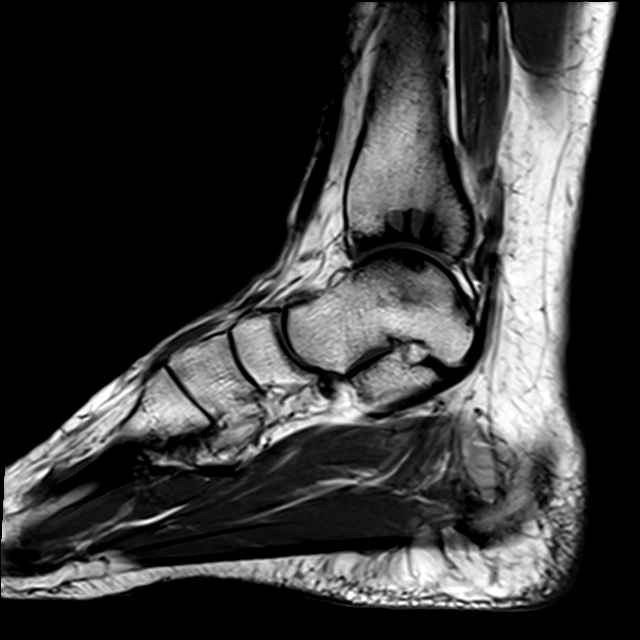
[im 22/22]
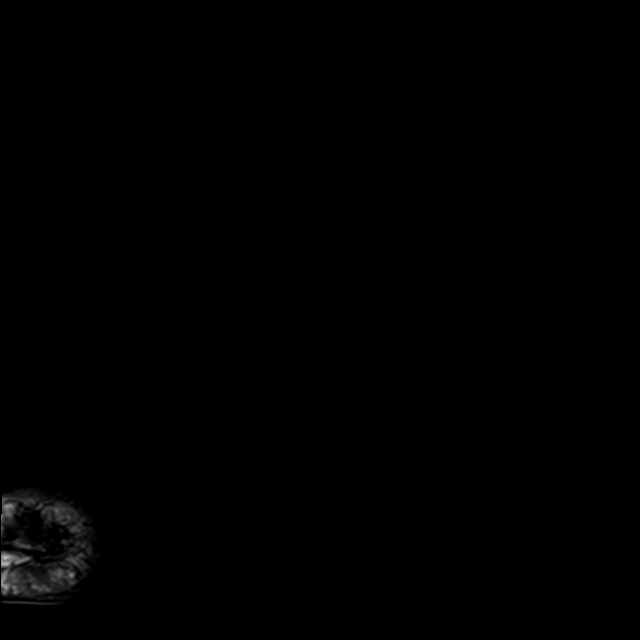

[Series 7: T2 fat-sat · coronal · right · 3.0mm · 0.25mm/px · 3 of 33 slices shown (2 of 2)]
[im 6/33]
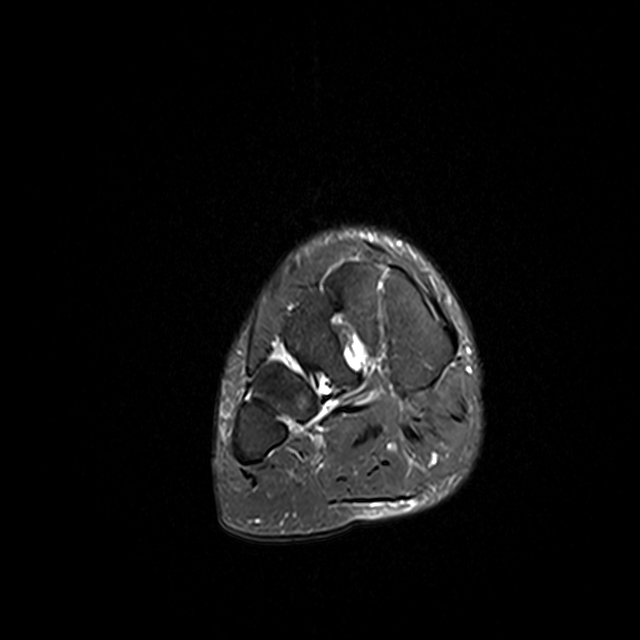
[im 17/33]
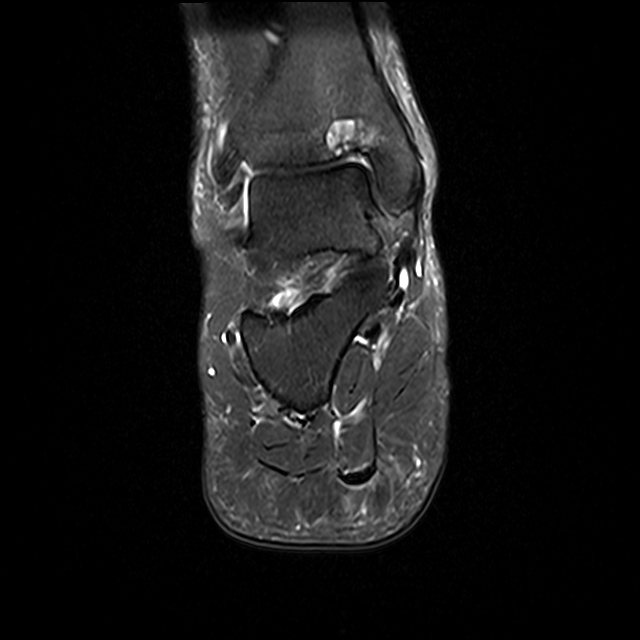
[im 27/33]
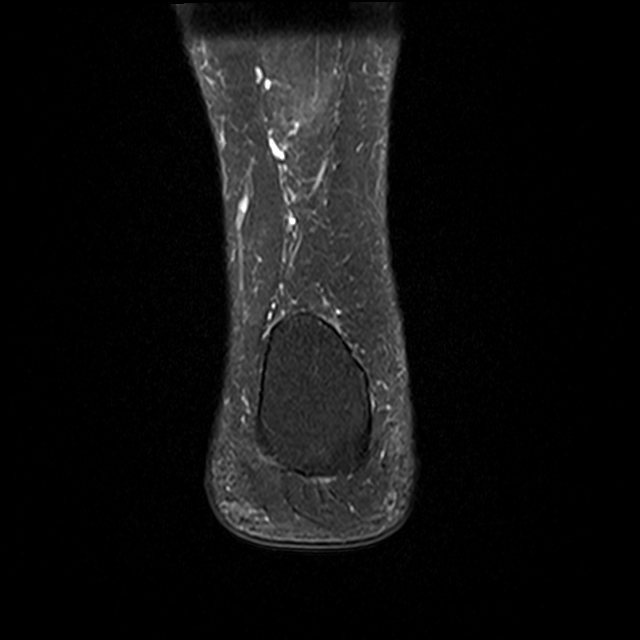

[12 of 40 positions shown; findings below may reference images not displayed]

FINDINGS: TENDONS

Peroneal: Peroneal longus and brevis tendons are intact.

Posteromedial: Posterior tibial tendon demonstrates mild fluid
signal surrounding the tendon above and below the medial malleolus.
Flexor digitorum longus tendon intact. Flexor hallucis longus tendon
intact.

Anterior: Tibialis anterior tendon intact. Extensor hallucis longus
tendon intact Extensor digitorum longus tendon intact.

Achilles:  Intact.

Plantar Fascia: There is mild thickening of the medial bundle
plantar fascia without increased signal.

LIGAMENTS

Lateral: Anterior talofibular ligament intact. Calcaneofibular
ligament intact. Posterior talofibular ligament intact. Anterior and
posterior tibiofibular ligaments intact.

Medial: Deltoid ligament intact. Spring ligament intact.

CARTILAGE

Ankle Joint: No joint effusion. There is focal cartilaginous
thinning and high-grade fissuring along the medial aspect of the
tibiotalar joint with subchondral bony edema in the medial talar
dome and subchondral cystic change in the tibial plafond. Adjacent
bony edema. No unstable osteochondral fragment.

Subtalar Joints/Sinus Tarsi: Normal subtalar joints. No subtalar
joint effusion. Normal sinus tarsi.

Bones: No acute fracture or dislocation. Tibiotalar osteophyte
formation partially visualized first MTP arthritis.

Soft Tissue: No fluid collection or hematoma. Muscles are normal
without edema or atrophy. Tarsal tunnel is normal.
IMPRESSION: Medial predominant tibiotalar arthritis, with cartilaginous
thinning, high-grade fissuring, and associated subchondral marrow
edema and cystic change. This corresponds to the lucencies seen on
recent radiograph. No evidence of unstable osteochondral fragment.

Mild tenosynovitis of the posterior tibial tendon above and below
the medial malleolus.

Intact ankle ligaments.  No evidence of acute tendon tear.

## 2021-03-13 ENCOUNTER — Encounter: Payer: Self-pay | Admitting: Family Medicine

## 2021-03-16 ENCOUNTER — Encounter: Payer: Self-pay | Admitting: Family Medicine

## 2021-03-16 DIAGNOSIS — M19071 Primary osteoarthritis, right ankle and foot: Secondary | ICD-10-CM | POA: Insufficient documentation

## 2021-03-16 HISTORY — DX: Primary osteoarthritis, right ankle and foot: M19.071

## 2021-04-01 ENCOUNTER — Ambulatory Visit: Payer: BC Managed Care – PPO | Admitting: Family Medicine

## 2021-04-28 DIAGNOSIS — D1801 Hemangioma of skin and subcutaneous tissue: Secondary | ICD-10-CM | POA: Diagnosis not present

## 2021-04-28 DIAGNOSIS — L821 Other seborrheic keratosis: Secondary | ICD-10-CM | POA: Diagnosis not present

## 2021-04-28 DIAGNOSIS — L578 Other skin changes due to chronic exposure to nonionizing radiation: Secondary | ICD-10-CM | POA: Diagnosis not present

## 2021-05-17 ENCOUNTER — Telehealth: Payer: BC Managed Care – PPO | Admitting: Physician Assistant

## 2021-05-17 ENCOUNTER — Telehealth: Payer: Self-pay

## 2021-05-17 DIAGNOSIS — U071 COVID-19: Secondary | ICD-10-CM | POA: Diagnosis not present

## 2021-05-17 MED ORDER — MOLNUPIRAVIR EUA 200MG CAPSULE: 4.0000 | ORAL_CAPSULE | Freq: Two times a day (BID) | ORAL | 0 refills | Status: AC

## 2021-05-17 MED ORDER — PSEUDOEPH-BROMPHEN-DM 30-2-10 MG/5ML PO SYRP: 5.0000 mL | ORAL_SOLUTION | Freq: Four times a day (QID) | ORAL | 0 refills | Status: DC | PRN

## 2021-05-17 MED ORDER — BENZONATATE 100 MG PO CAPS
100.0000 mg | ORAL_CAPSULE | Freq: Three times a day (TID) | ORAL | 0 refills | Status: DC | PRN
Start: 2021-05-17 — End: 2021-07-02

## 2021-05-17 MED ORDER — ONDANSETRON HCL 4 MG PO TABS: 4.0000 mg | ORAL_TABLET | Freq: Three times a day (TID) | ORAL | 0 refills | Status: DC | PRN

## 2021-05-17 NOTE — Patient Instructions (Signed)
Hello Theresa Gonzalez,  You are being placed in the home monitoring program for COVID-19 (commonly known as Coronavirus).  This is because you are suspected to have the virus or are known to have the virus.  If you are unsure which group you fall into call your clinic.    As part of this program, you'll answer a daily questionnaire in the MyChart mobile app. You'll receive a notification through the MyChart app when the questionnaire is available. When you log in to MyChart, you'll see the tasks in your To Do activity.       Clinicians will see any answers that are concerning and take appropriate steps.  If at any point you are having a medical emergency, call 911.  If otherwise concerned call your clinic instead of coming into the clinic or hospital.  To keep from spreading the disease you should: Stay home and limit contact with other people as much as possible.  Wash your hands frequently. Cover your coughs and sneezes with a tissue, and throw used tissues in the trash.   Clean and disinfect frequently touched surfaces and objects.    Take care of yourself by: Staying home Resting Drinking fluids Take fever-reducing medications (Tylenol/Acetaminophen and Ibuprofen)  For more information on the disease go to the Centers for Disease Control and Prevention website     You are being prescribed MOLNUPIRAVIR for COVID-19 infection.   Please call the pharmacy or go through the drive through vs going inside if you are picking up the mediation yourself to prevent further spread. If prescribed to a [facility] affiliated pharmacy, a pharmacist will bring the medication out to your car.   ADMINISTRATION INSTRUCTIONS: Take with or without food. Swallow the tablets whole. Don't chew, crush, or break the medications because it might not work as well  For each dose of the medication, you should be taking FOUR tablets at one time, TWICE a day   Finish your full five-day course of Molnupiravir even if you  feel better before you're done. Stopping this medication too early can make it less effective to prevent severe illness related to COVID19.    Molnupiravir is prescribed for YOU ONLY. Don't share it with others, even if they have similar symptoms as you. This medication might not be right for everyone.   Make sure to take steps to protect yourself and others while you're taking this medication in order to get well soon and to prevent others from getting sick with COVID-19.   **If you are of childbearing potential (any gender) - it is advised to not get pregnant while taking this medication and recommended that condoms are used for female partners the next 3 months after taking the medication out of extreme caution    COMMON SIDE EFFECTS: Diarrhea Nausea  Dizziness    If your COVID-19 symptoms get worse, get medical help right away. Call 911 if you experience symptoms such as worsening cough, trouble breathing, chest pain that doesn't go away, confusion, a hard time staying awake, and pale or blue-colored skin. This medication won't prevent all COVID-19 cases from getting worse.   Can take to lessen severity: Vit C 500mg  twice daily Quercertin 250-500mg  twice daily Zinc 75-100mg  daily Melatonin 3-6 mg at bedtime Vit D3 1000-2000 IU daily Aspirin 81 mg daily with food Optional: Famotidine 20mg  daily Also can add tylenol/ibuprofen as needed for fevers and body aches May add Mucinex or Mucinex DM as needed for cough/congestion  10 Things You Can Do  to Manage Your COVID-19 Symptoms at Home If you have possible or confirmed COVID-19 Stay home except to get medical care. Monitor your symptoms carefully. If your symptoms get worse, call your healthcare provider immediately. Get rest and stay hydrated. If you have a medical appointment, call the healthcare provider ahead of time and tell them that you have or may have COVID-19. For medical emergencies, call 911 and notify the dispatch  personnel that you have or may have COVID-19. Cover your cough and sneezes with a tissue or use the inside of your elbow. Wash your hands often with soap and water for at least 20 seconds or clean your hands with an alcohol-based hand sanitizer that contains at least 60% alcohol. As much as possible, stay in a specific room and away from other people in your home. Also, you should use a separate bathroom, if available. If you need to be around other people in or outside of the home, wear a mask. Avoid sharing personal items with other people in your household, like dishes, towels, and bedding. Clean all surfaces that are touched often, like counters, tabletops, and doorknobs. Use household cleaning sprays or wipes according to the label instructions. [email or web address] 01/18/2020 This information is not intended to replace advice given to you by your health care provider. Make sure you discuss any questions you have with your health care provider. Document Revised: 03/13/2021 Document Reviewed: 03/13/2021 Elsevier Patient Education  [place].

## 2021-05-17 NOTE — Progress Notes (Signed)
Virtual Visit Consent   Theresa Gonzalez, you are scheduled for a virtual visit with a [facility] provider today.     Just as with appointments in the office, your consent must be obtained to participate.  Your consent will be active for this visit and any virtual visit you may have with one of our providers in the next 365 days.     If you have a MyChart account, a copy of this consent can be sent to you electronically.  All virtual visits are billed to your insurance company just like a traditional visit in the office.    As this is a virtual visit, video technology does not allow for your provider to perform a traditional examination.  This may limit your provider's ability to fully assess your condition.  If your provider identifies any concerns that need to be evaluated in person or the need to arrange testing (such as labs, EKG, etc.), we will make arrangements to do so.     Although advances in technology are sophisticated, we cannot ensure that it will always work on either your end or our end.  If the connection with a video visit is poor, the visit may have to be switched to a telephone visit.  With either a video or telephone visit, we are not always able to ensure that we have a secure connection.     I need to obtain your verbal consent now.   Are you willing to proceed with your visit today?    Theresa Gonzalez has provided verbal consent on 05/17/2021 for a virtual visit (video or telephone).   Theresa Loveless, PA-C   Date: 05/17/2021 9:56 AM   Virtual Visit via Video Note   I, Theresa Gonzalez, connected with  Theresa Gonzalez  ([id number], [birth year]) on 05/17/21 at  9:30 AM EST by a video-enabled telemedicine application and verified that I am speaking with the correct person using two identifiers.  Location: Patient: Virtual Visit Location Patient: Home Provider: Virtual Visit Location Provider: Home Office   I discussed the limitations of evaluation and management by  telemedicine and the availability of in person appointments. The patient expressed understanding and agreed to proceed.    History of Present Illness: Theresa Gonzalez is a 55 y.o. who identifies as a female who was assigned female at birth, and is being seen today for Covid 75.  HPI: URI  This is a new problem. Episode onset: Symptoms started yesterday night; tested positive today. The maximum temperature recorded prior to her arrival was 102 - 102.9 F. The fever has been present for Less than 1 day. Associated symptoms include congestion, coughing, headaches, nausea (this morning) and rhinorrhea. Pertinent negatives include no diarrhea, ear pain, plugged ear sensation, sinus pain, sore throat or vomiting. Associated symptoms comments: Body aches, post nasal drainage. Treatments tried: tylenol, tessalon perles. The treatment provided no relief.     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, 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   Mixed hyperlipidemia 02/04/2011    Allergies:  Allergies  Allergen Reactions   Penicillins Rash and Shortness Of Breath   Potassium Citrate Itching   Ropinirole Shortness Of Breath   Cetirizine Hcl    Nabumetone Rash   Nsaids Rash    Provider: Valere Dross CFM - Allergy Description: NSAIDs CFM - Allergy Annotation: rash   Sodium Hyaluronate Hives   Medications:  Current Outpatient Medications:    benzonatate (TESSALON) 100 MG capsule, Take 1 capsule (100 mg total) by mouth  3 (three) times daily as needed., Disp: 30 capsule, Rfl: 0   brompheniramine-pseudoephedrine-DM 30-2-10 MG/5ML syrup, Take 5 mLs by mouth 4 (four) times daily as needed., Disp: 120 mL, Rfl: 0   molnupiravir EUA (LAGEVRIO) 200 mg CAPS capsule, Take 4 capsules (800 mg total) by mouth 2 (two) times daily for 5 days., Disp: 40 capsule, Rfl: 0   ondansetron (ZOFRAN) 4 MG tablet, Take 1 tablet (4 mg total) by mouth every 8 (eight) hours as needed for nausea or vomiting., Disp: 20 tablet, Rfl: 0   albuterol (VENTOLIN HFA) 108 (90 Base) MCG/ACT inhaler, Inhale into the lungs., Disp: , Rfl:    dexlansoprazole (DEXILANT) 60 MG capsule, Take 1 capsule once a day. This will replace lansoprazole, Disp: 90 capsule, Rfl: 1   EPINEPHrine 0.3 mg/0.3 mL IJ SOAJ injection, Inject into the muscle., Disp: , Rfl:    famotidine (PEPCID) 20 MG tablet, Take one tablet twice a day to control reflux, Disp: 180 tablet, Rfl: 1   fenofibrate (TRICOR) 145 MG tablet, Take 1 tablet (145 mg total) by mouth daily., Disp: 90 tablet, Rfl: 3   fluticasone (FLONASE) 50 MCG/ACT nasal spray, 2 sprays per nostril once a day as needed for stuffy nose., Disp: 16 g, Rfl: 5   levocetirizine (XYZAL) 5 MG tablet, Take 1 tablet (5 mg total) by mouth every evening., Disp: 30 tablet, Rfl: 3   pramipexole (MIRAPEX) 1 MG tablet, Take 1 tablet (1 mg total) by mouth at bedtime., Disp: 90 tablet, Rfl: 3   traZODone (DESYREL) 50 MG tablet, Take 100 mg by mouth at bedtime as needed., Disp: , Rfl:   Observations/Objective: Patient is well-developed, well-nourished in no acute distress.  Appears ill Resting comfortably at home.  Head is normocephalic, atraumatic.  No labored breathing.  Speech is clear and coherent with logical content.  Patient is alert and oriented at baseline.    Assessment and Plan: 1. COVID-19 - MyChart COVID-19 home monitoring program; Future - molnupiravir EUA (LAGEVRIO) 200 mg CAPS capsule; Take 4 capsules (800 mg total) by  mouth 2 (two) times daily for 5 days.  Dispense: 40 capsule; Refill: 0 - brompheniramine-pseudoephedrine-DM 30-2-10 MG/5ML syrup; Take 5 mLs by mouth 4 (four) times daily as needed.  Dispense: 120 mL; Refill: 0 - benzonatate (TESSALON) 100 MG capsule; Take 1 capsule (100 mg total) by mouth 3 (three) times daily as needed.  Dispense: 30 capsule; Refill: 0 - ondansetron (ZOFRAN) 4 MG tablet; Take 1 tablet (4 mg total) by mouth every 8 (eight) hours as needed for nausea or vomiting.  Dispense: 20 tablet; Refill: 0  - Continue OTC symptomatic management of choice - Will send OTC vitamins and supplement information through [facility] - Molnupiravir prescribed - Tessalon perles and Bromfed DM for cough - Zofran for nausea - Patient enrolled in MyChart symptom monitoring - Push fluids - Rest as needed - Discussed return precautions and when to seek in-person evaluation, sent via [facility]S as well  Follow Up Instructions: I discussed the assessment and treatment plan with the  patient. The patient was provided an opportunity to ask questions and all were answered. The patient agreed with the plan and demonstrated an understanding of the instructions.  A copy of instructions were sent to the patient via MyChart unless otherwise noted below.    The patient was advised to call back or seek an in-person evaluation if the symptoms worsen or if the condition fails to improve as anticipated.  Time:  I spent 15 minutes with the patient via telehealth technology discussing the above problems/concerns.    Theresa Loveless, PA-C

## 2021-05-17 NOTE — Telephone Encounter (Signed)
BPA triggered for worsening symptoms cough, weakness, and decreased appetite.   Pt called, left VM to call back if needed, also sent pt Mychart message with information on symptoms and recommendations. Pt tested positive today and had virtual visit with [place]. Will continue to monitor Mychart questionnaires.

## 2021-05-25 ENCOUNTER — Other Ambulatory Visit: Payer: Self-pay

## 2021-06-01 ENCOUNTER — Telehealth: Payer: Self-pay

## 2021-06-01 DIAGNOSIS — G2581 Restless legs syndrome: Secondary | ICD-10-CM

## 2021-06-01 MED ORDER — TRAZODONE HCL 50 MG PO TABS: 100.0000 mg | ORAL_TABLET | Freq: Every evening | ORAL | 3 refills | Status: DC | PRN

## 2021-06-01 NOTE — Telephone Encounter (Signed)
Lft detailed VM that RX was sent to the pharmacy. Dm/cma  

## 2021-06-01 NOTE — Telephone Encounter (Signed)
Refill request for: Trazodone 50mg  LR:  historical provider LOV 02/27/21 FOV  none scheduled   [facility] [address]  Please review and advise.  Thanks.  Dm/cma

## 2021-06-05 DIAGNOSIS — Z83518 Family history of other specified eye disorder: Secondary | ICD-10-CM | POA: Diagnosis not present

## 2021-06-05 DIAGNOSIS — H0101A Ulcerative blepharitis right eye, upper and lower eyelids: Secondary | ICD-10-CM | POA: Diagnosis not present

## 2021-06-05 DIAGNOSIS — H0101B Ulcerative blepharitis left eye, upper and lower eyelids: Secondary | ICD-10-CM | POA: Diagnosis not present

## 2021-06-26 ENCOUNTER — Other Ambulatory Visit: Payer: Self-pay

## 2021-06-26 ENCOUNTER — Ambulatory Visit: Payer: BC Managed Care – PPO | Admitting: Family Medicine

## 2021-06-26 VITALS — BP 122/84 | HR 75 | Temp 97.7°F | Ht 62.0 in | Wt 165.6 lb

## 2021-06-26 DIAGNOSIS — N10 Acute tubulo-interstitial nephritis: Secondary | ICD-10-CM

## 2021-06-26 DIAGNOSIS — N912 Amenorrhea, unspecified: Secondary | ICD-10-CM

## 2021-06-26 LAB — POCT URINE PREGNANCY: Preg Test, Ur: NEGATIVE

## 2021-06-26 LAB — CBC
HCT: 38.9 % (ref 36.0–46.0)
Hemoglobin: 13.1 g/dL (ref 12.0–15.0)
MCHC: 33.7 g/dL (ref 30.0–36.0)
MCV: 84 fl (ref 78.0–100.0)
Platelets: 244 10*3/uL (ref 150.0–400.0)
RBC: 4.62 Mil/uL (ref 3.87–5.11)
RDW: 13.9 % (ref 11.5–15.5)
WBC: 4.3 10*3/uL (ref 4.0–10.5)

## 2021-06-26 LAB — POCT URINALYSIS DIPSTICK
Bilirubin, UA: NEGATIVE
Blood, UA: NEGATIVE
Glucose, UA: NEGATIVE
Ketones, UA: NEGATIVE
Leukocytes, UA: NEGATIVE
Nitrite, UA: NEGATIVE
Protein, UA: NEGATIVE
Spec Grav, UA: 1.02 (ref 1.010–1.025)
Urobilinogen, UA: 0.2 E.U./dL
pH, UA: 6 (ref 5.0–8.0)

## 2021-06-26 MED ORDER — CIPROFLOXACIN HCL 500 MG PO TABS: 500.0000 mg | ORAL_TABLET | Freq: Two times a day (BID) | ORAL | 0 refills | Status: AC

## 2021-06-26 NOTE — Progress Notes (Signed)
[facility] [address] [place] [address] Dept: [phone] Dept Fax: [phone]  Office Visit  Subjective:    Patient ID: Theresa Gonzalez, female    DOB: Jul 13, 1965, 55 y.o..   MRN: [id number]  Chief Complaint  Patient presents with   Back Pain    Lower back pains, dysuria, low fever, vomiting, symptoms started on 4 days ago. Diarrhea started yesterday.     History of Present Illness:  Patient is in today complaining of a 4-day history of low to mid back pain, esp. on the right, low grade fever, vomiting, and dysuria. She had noted her urine to be cloudy. Ms. Heuerman has had 2 prior kidney stones and notes some of her symptoms are similar to this. She has not seen any  blood in her urine. She has not had a menses since Aug. She had noted some menstrual irregularity over the past year with a lengthening time between menses.  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, 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   Mixed hyperlipidemia 02/04/2011   Past Surgical History:  Procedure Laterality Date   HERNIA REPAIR Bilateral    knee replacement Left 06/2019   LAPAROSCOPIC LYSIS OF ADHESIONS     TUBAL LIGATION     Family History  Problem Relation Age of Onset   Hypertension Mother    Hyperlipidemia Mother    Retinitis pigmentosa Mother    Heart disease Father    Diabetes Father    Prostate cancer Father    Diabetes Brother     Retinitis pigmentosa Maternal Aunt    Heart attack Maternal Grandmother    Stroke Maternal Grandfather    Heart attack Maternal Grandfather    Heart disease Paternal Grandmother    Epilepsy Paternal Grandmother    Diabetes Paternal Grandfather    Allergic rhinitis Neg Hx    Asthma Neg Hx    Eczema Neg Hx    Urticaria Neg Hx    Outpatient Medications Prior to Visit  Medication Sig Dispense Refill   albuterol (VENTOLIN HFA) 108 (90 Base) MCG/ACT inhaler Inhale into the lungs.     EPINEPHrine 0.3 mg/0.3 mL IJ SOAJ injection Inject into the muscle.     famotidine (PEPCID) 20 MG tablet Take one tablet twice a day to control reflux 180 tablet 1   fenofibrate (TRICOR) 145 MG tablet Take 1 tablet (145 mg total) by mouth daily. 90 tablet 3   fluticasone (FLONASE) 50 MCG/ACT nasal spray 2 sprays per nostril once a day as needed for stuffy nose. 16 g 5   levocetirizine (XYZAL) 5 MG tablet Take 1 tablet (5 mg total) by mouth every evening. 30 tablet 3   pramipexole (MIRAPEX) 1 MG tablet Take 1 tablet (1 mg total) by mouth at bedtime. 90 tablet 3   benzonatate (TESSALON) 100 MG capsule Take 1 capsule (100 mg total) by mouth 3 (three) times daily as needed. 30 capsule 0   brompheniramine-pseudoephedrine-DM 30-2-10 MG/5ML syrup Take 5 mLs by mouth 4 (four) times  daily as needed. 120 mL 0   dexlansoprazole (DEXILANT) 60 MG capsule Take 1 capsule once a day. This will replace lansoprazole 90 capsule 1   ondansetron (ZOFRAN) 4 MG tablet Take 1 tablet (4 mg total) by mouth every 8 (eight) hours as needed for nausea or vomiting. 20 tablet 0   traZODone (DESYREL) 50 MG tablet Take 2 tablets (100 mg total) by mouth at bedtime as needed. 90 tablet 3   No facility-administered medications prior to visit.   Allergies  Allergen Reactions   Penicillins Rash and Shortness Of Breath   Potassium Citrate Itching   Ropinirole Shortness Of Breath   Cetirizine Hcl    Nabumetone Rash   Nsaids Rash    Provider:  Valere Dross CFM - Allergy Description: NSAIDs CFM - Allergy Annotation: rash   Sodium Hyaluronate Hives   Objective:   Today's Vitals   06/26/21 0801  BP: 122/84  Pulse: 75  Temp: 97.7 F (36.5 C)  TempSrc: Temporal  SpO2: 98%  Weight: 165 lb 9.6 oz (75.1 kg)  Height: 5\' 2"  (1.575 m)   Body mass index is 30.29 kg/m.   General: Well developed, well nourished. No acute distress. Back: Mild to moderate CVA tenderness on the right. Abdomen: Soft. Mild to moderate tenderness with palpation in the right abdomen. Bowel sounds mildly   hyperactive. No rebound or guarding. No masses. Psych: Alert and oriented. Normal mood and affect.  Health Maintenance Due  Topic Date Due   Pneumococcal Vaccine 50-70 Years old (1 - PCV) Never done   Hepatitis C Screening  Never done   Lab Results POCT Urine HCG: Neg.  Urine dipstick shows negative for nitrites, red blood cells, glucose, ketones, urobilinogen, positive for leukocytes, protein.     Assessment & Plan:   1. Pyelonephritis, acute Ms. Worland's exam is concerning for possible mild pyelonephritis, with right CVA tenderness, low grade fever, and vomiting. I will treat her empirically with Cipro. I wills end the urine for culture and check a CBC. She is advised to get rest and push fluids. If her symptoms are not resolving over the weekend, I would consider a CT renal study.  - POCT Urinalysis Dipstick - CBC - ciprofloxacin (CIPRO) 500 MG tablet; Take 1 tablet (500 mg total) by mouth 2 (two) times daily for 5 days.  Dispense: 10 tablet; Refill: 0  2. Absent periods HCG is negative. I will check LH/FSH to confirm menopause.  - POCT urine pregnancy - FSH/LH  77, MD

## 2021-06-27 LAB — URINE CULTURE
MICRO NUMBER:: 12795491
SPECIMEN QUALITY:: ADEQUATE

## 2021-06-27 LAB — FSH/LH
FSH: 110.5 m[IU]/mL
LH: 47 m[IU]/mL

## 2021-07-02 ENCOUNTER — Encounter: Payer: Self-pay | Admitting: Family Medicine

## 2021-07-02 ENCOUNTER — Other Ambulatory Visit: Payer: Self-pay

## 2021-07-02 ENCOUNTER — Ambulatory Visit: Payer: BC Managed Care – PPO | Admitting: Family Medicine

## 2021-07-02 VITALS — BP 120/84 | HR 70 | Temp 96.6°F | Ht 62.0 in | Wt 165.2 lb

## 2021-07-02 DIAGNOSIS — R109 Unspecified abdominal pain: Secondary | ICD-10-CM

## 2021-07-02 DIAGNOSIS — Z87442 Personal history of urinary calculi: Secondary | ICD-10-CM

## 2021-07-02 HISTORY — DX: Personal history of urinary calculi: Z87.442

## 2021-07-02 LAB — CBC WITH DIFFERENTIAL/PLATELET
Basophils Absolute: 0 10*3/uL (ref 0.0–0.1)
Basophils Relative: 0.7 % (ref 0.0–3.0)
Eosinophils Absolute: 0.1 10*3/uL (ref 0.0–0.7)
Eosinophils Relative: 2.4 % (ref 0.0–5.0)
HCT: 40.4 % (ref 36.0–46.0)
Hemoglobin: 13.5 g/dL (ref 12.0–15.0)
Lymphocytes Relative: 34.1 % (ref 12.0–46.0)
Lymphs Abs: 1.5 10*3/uL (ref 0.7–4.0)
MCHC: 33.4 g/dL (ref 30.0–36.0)
MCV: 84 fl (ref 78.0–100.0)
Monocytes Absolute: 0.4 10*3/uL (ref 0.1–1.0)
Monocytes Relative: 9.2 % (ref 3.0–12.0)
Neutro Abs: 2.4 10*3/uL (ref 1.4–7.7)
Neutrophils Relative %: 53.6 % (ref 43.0–77.0)
Platelets: 279 10*3/uL (ref 150.0–400.0)
RBC: 4.8 Mil/uL (ref 3.87–5.11)
RDW: 13.7 % (ref 11.5–15.5)
WBC: 4.5 10*3/uL (ref 4.0–10.5)

## 2021-07-02 LAB — COMPREHENSIVE METABOLIC PANEL
ALT: 25 U/L (ref 0–35)
AST: 19 U/L (ref 0–37)
Albumin: 4.5 g/dL (ref 3.5–5.2)
Alkaline Phosphatase: 59 U/L (ref 39–117)
BUN: 16 mg/dL (ref 6–23)
CO2: 30 mEq/L (ref 19–32)
Calcium: 9.7 mg/dL (ref 8.4–10.5)
Chloride: 104 mEq/L (ref 96–112)
Creatinine, Ser: 1.04 mg/dL (ref 0.40–1.20)
GFR: 59.91 mL/min — ABNORMAL LOW (ref >60.00)
Glucose, Bld: 97 mg/dL (ref 70–99)
Potassium: 4.1 mEq/L (ref 3.5–5.1)
Sodium: 141 mEq/L (ref 135–145)
Total Bilirubin: 0.6 mg/dL (ref 0.2–1.2)
Total Protein: 6.7 g/dL (ref 6.0–8.3)

## 2021-07-02 LAB — LIPASE: Lipase: 32 U/L (ref 11.0–59.0)

## 2021-07-02 NOTE — Progress Notes (Signed)
[facility] [address] [place] [address] Dept: [phone] Dept Fax: [phone]  Office Visit  Subjective:    Patient ID: Theresa Gonzalez, female    DOB: [birth year], 55 y.o..   MRN: [id number]  Chief Complaint  Patient presents with   Acute Visit    Pt c/o pain when urinating, low back pain, and nausea x4-6 days. Pt states there has not been much improvement since her visit last week. Pt states she has also been very fatigued.     History of Present Illness:  Patient is in today for reassessment of abdominal pain. I had seen Theresa Gonzalez on 12/23. She had a 4-day history of low to mid back pain, esp. on the right, low grade fever, vomiting, and dysuria. She had noted her urine to be cloudy. Theresa Gonzalez has had 2 prior kidney stones and notes some of her symptoms are similar to this. She has not seen any  blood in her urine. She has not had a menses since Aug. She had noted some menstrual irregularity over the past year with a lengthening time between menses. Her UA was positive for leukocytes, but no blood. She had some right CVA tenderness. I treated her with a course of Cipro for a possible early pyelonephritis.  Theresa Gonzalez notes that over the weekend, she has continued to have nausea. She notes this esp. in the morning, if she does not get up to go urinate right away. She is eating and drinking well. She has used some Zofran for the nausea, which has helped. She denies any increased pain or nausea with eating fatty foods. She denies any diarrhea, in fact, has not had a bowel movement since Sun. She continues to note the back pain, esp. on the right. She has not had fever.  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, 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   Mixed hyperlipidemia 02/04/2011   Past Surgical History:  Procedure Laterality Date   HERNIA REPAIR Bilateral    knee replacement Left 06/2019   LAPAROSCOPIC LYSIS OF ADHESIONS     TUBAL LIGATION     Family History  Problem Relation Age of Onset   Hypertension Mother    Hyperlipidemia Mother    Retinitis pigmentosa Mother    Heart disease Father    Diabetes Father    Prostate cancer Father    Diabetes Brother    Retinitis pigmentosa Maternal Aunt    Heart attack Maternal Grandmother    Stroke Maternal Grandfather    Heart attack Maternal Grandfather    Heart disease Paternal Grandmother    Epilepsy Paternal Grandmother    Diabetes Paternal Grandfather    Allergic rhinitis Neg Hx    Asthma Neg Hx    Eczema Neg Hx    Urticaria Neg Hx    Outpatient Medications Prior to Visit  Medication Sig Dispense Refill   albuterol (VENTOLIN HFA) 108 (90 Base) MCG/ACT inhaler Inhale into the lungs.  EPINEPHrine 0.3 mg/0.3 mL IJ SOAJ injection Inject into the muscle.     famotidine (PEPCID) 20 MG tablet Take one tablet twice a day to control reflux 180 tablet 1   fenofibrate (TRICOR) 145 MG tablet Take 1 tablet (145 mg total) by mouth daily. 90 tablet 3   fluticasone (FLONASE) 50 MCG/ACT nasal spray 2 sprays per nostril once a day as needed for stuffy nose. 16 g 5   levocetirizine (XYZAL) 5 MG tablet Take 1 tablet (5 mg total) by mouth every evening. 30 tablet 3   pramipexole (MIRAPEX) 1 MG tablet Take 1 tablet (1 mg total) by mouth at bedtime. 90 tablet 3   ondansetron (ZOFRAN) 4 MG tablet Take 1 tablet (4 mg total) by  mouth every 8 (eight) hours as needed for nausea or vomiting. 20 tablet 0   benzonatate (TESSALON) 100 MG capsule Take 1 capsule (100 mg total) by mouth 3 (three) times daily as needed. 30 capsule 0   brompheniramine-pseudoephedrine-DM 30-2-10 MG/5ML syrup Take 5 mLs by mouth 4 (four) times daily as needed. 120 mL 0   dexlansoprazole (DEXILANT) 60 MG capsule Take 1 capsule once a day. This will replace lansoprazole 90 capsule 1   traZODone (DESYREL) 50 MG tablet Take 2 tablets (100 mg total) by mouth at bedtime as needed. 90 tablet 3   No facility-administered medications prior to visit.   Allergies  Allergen Reactions   Penicillins Rash and Shortness Of Breath   Potassium Citrate Itching   Ropinirole Shortness Of Breath   Cetirizine Hcl    Nabumetone Rash   Nsaids Rash    Provider: Valere Dross CFM - Allergy Description: NSAIDs CFM - Allergy Annotation: rash   Sodium Hyaluronate Hives     Objective:   Today's Vitals   07/02/21 0749  BP: 120/84  Pulse: 70  Temp: (!) 96.6 F (35.9 C)  TempSrc: Temporal  SpO2: 98%  Weight: 165 lb 3.2 oz (74.9 kg)  Height: 5\' 2"  (1.575 m)   Body mass index is 30.22 kg/m.   General: Well developed, well nourished. No acute distress. Lungs: Clear to auscultation bilaterally. No wheezing, rales or rhonchi. CV: RRR without murmurs or rubs. Pulses 2+ bilaterally. Abdomen: Soft. Bowel sounds positive, normal pitch and frequency. No hepatosplenomegaly.   Mild to moderate pain in the right mid abdomen and the LLQ. No masses. No rebound or   guarding. Back: Straight. Moderate right CVA tenderness. Milder on the left. Psych: Alert and oriented. Normal mood and affect.  Health Maintenance Due  Topic Date Due   Pneumococcal Vaccine 39-41 Years old (1 - PCV) Never done   Hepatitis C Screening  Never done   Lab Results Last CBC Lab Results  Component Value Date   WBC 4.3 06/26/2021   HGB 13.1 06/26/2021   HCT 38.9 06/26/2021   MCV 84.0  06/26/2021   MCH 29.1 01/12/2021   RDW 13.9 06/26/2021   PLT 244.0 06/26/2021     Component Ref Range & Units 6 d ago   FSH mIU/mL 110.5   Comment:                     Reference Range  .               Follicular Phase       2.5-10.2               Mid-cycle Peak         3.1-17.7  Luteal Phase           1.5- 9.1               Postmenopausal       23.0-116.3              .   LH mIU/mL 47.0   Comment:     Reference Range  Follicular Phase  1.9-12.5  Mid-Cycle Peak    8.7-76.3  Luteal Phase      0.5-16.9  Postmenopausal    10.0-54.7    Component 6 d ago   MICRO NUMBER: [id number]   SPECIMEN QUALITY: Adequate   Sample Source NOT GIVEN   STATUS: FINAL   Result: Mixed genital flora isolated. These superficial bacteria are not indicative of a urinary tract infection. No further organism identification is warranted on this specimen. If clinically indicated, recollect clean-catch, mid-stream urine and transfer  immediately to Urine Culture Transport Tube.       Assessment & Plan:   1. Acute abdominal pain The etiology of the abdominal pain remains unclear. It seems to be urinary tract related. The normal CBC and negative urine culture seem to rule-out an infectious cause. With her prior history of kidney stones, this seems the most likely etiology. I will check further labs and order an abdominal CT. She can continue Zofran as needed.  - CT Abdomen Pelvis Wo Contrast; Future - Comprehensive metabolic panel - Lipase, blood; Future - CBC with Differential/Platelet  2. History of kidney stones As noted above.  Loyola Mast, MD

## 2021-07-02 NOTE — Addendum Note (Signed)
Addended by: Edilia Bo on: 07/02/2021 09:23 AM   Modules accepted: Orders

## 2021-07-08 ENCOUNTER — Ambulatory Visit
Admission: RE | Admit: 2021-07-08 | Discharge: 2021-07-08 | Disposition: A | Discharge status: Home / Self Care | Payer: BC Managed Care – PPO | Source: Ambulatory Visit | Attending: Family Medicine | Admitting: Family Medicine

## 2021-07-08 ENCOUNTER — Other Ambulatory Visit: Payer: Self-pay | Admitting: Family Medicine

## 2021-07-08 DIAGNOSIS — Q446 Cystic disease of liver: Secondary | ICD-10-CM

## 2021-07-08 DIAGNOSIS — K6389 Other specified diseases of intestine: Secondary | ICD-10-CM

## 2021-07-08 DIAGNOSIS — K668 Other specified disorders of peritoneum: Secondary | ICD-10-CM | POA: Insufficient documentation

## 2021-07-08 DIAGNOSIS — R1110 Vomiting, unspecified: Secondary | ICD-10-CM | POA: Diagnosis not present

## 2021-07-08 DIAGNOSIS — R935 Abnormal findings on diagnostic imaging of other abdominal regions, including retroperitoneum: Secondary | ICD-10-CM

## 2021-07-08 DIAGNOSIS — R100 Acute abdomen: Secondary | ICD-10-CM | POA: Diagnosis not present

## 2021-07-08 DIAGNOSIS — R109 Unspecified abdominal pain: Secondary | ICD-10-CM

## 2021-07-08 HISTORY — DX: Cystic disease of liver: Q44.6

## 2021-07-08 HISTORY — DX: Other specified disorders of peritoneum: K66.8

## 2021-07-08 IMAGING — CT CT ABD-PELV W/O CM
2 of 4 series · 13 of 46 positions shown, 15 images · non-contrast
Comparison: None.

CLINICAL DATA: Abdominal pain, acute, nonlocalized. Vomiting.
Burning with urination.

EXAM:
CT ABDOMEN AND PELVIS WITHOUT CONTRAST
TECHNIQUE: Multidetector CT imaging of the abdomen and pelvis was performed
following the standard protocol without IV contrast.

[Series 2: routine abdomen pelvis without 5.00 br40 s3 axial · axial · non-contrast · 0.60mm/px · z∈[+1173,+1533]mm · 10 of 84 slices shown, 12 images]
[im 6/84  soft-tissue]
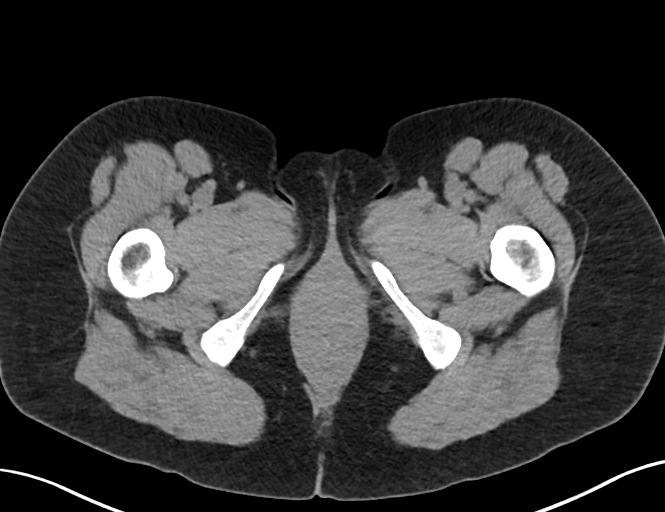
[im 6/84  bone]
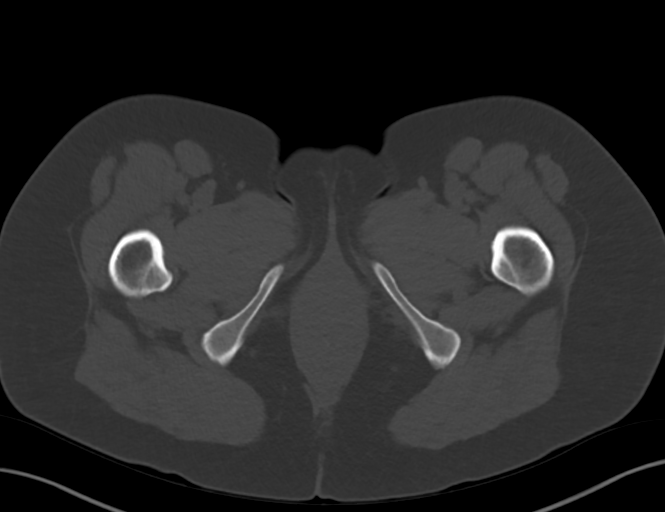
[im 17/84  soft-tissue]
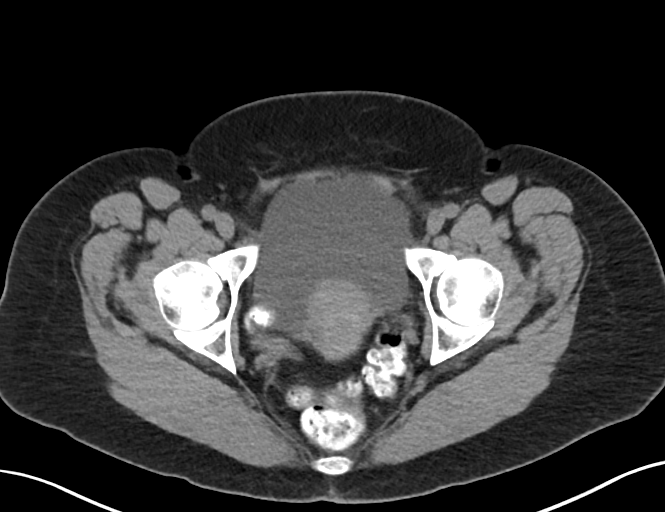
[im 23/84  soft-tissue]
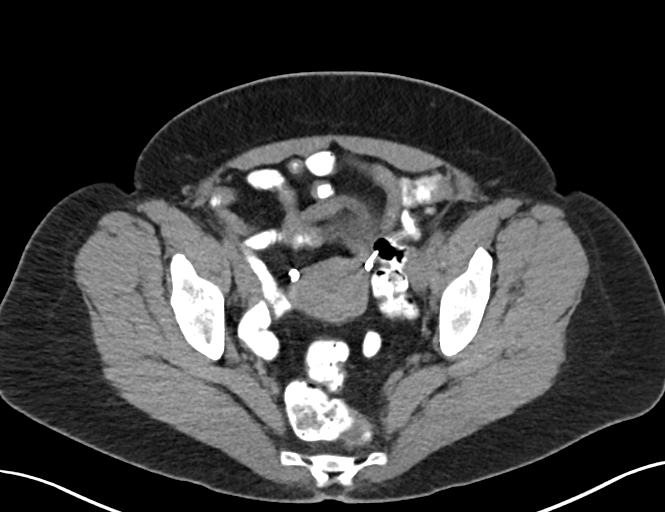
[im 28/84  soft-tissue]
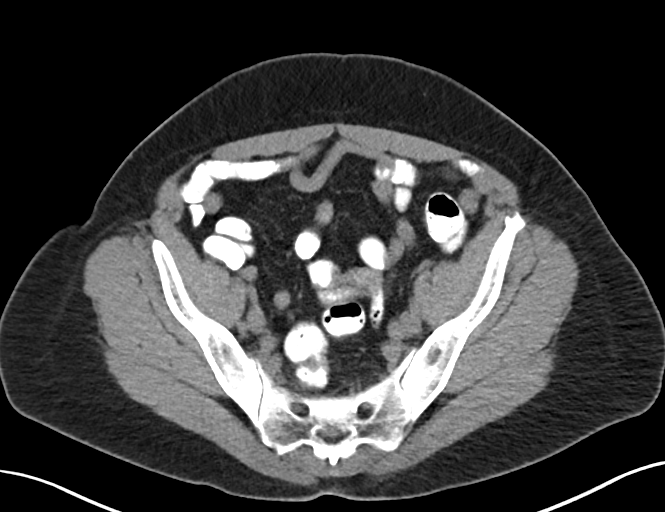
[im 39/84  soft-tissue]
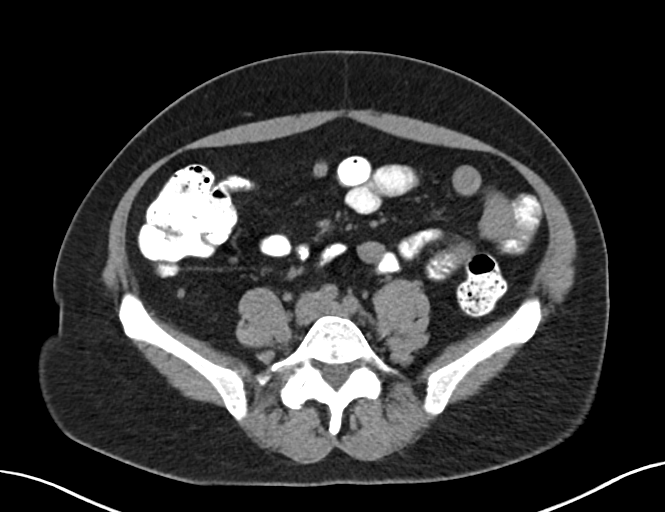
[im 45/84  soft-tissue]
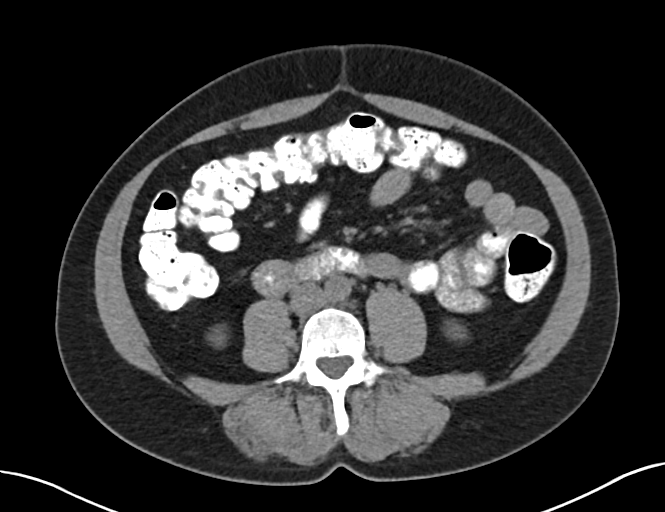
[im 56/84  soft-tissue]
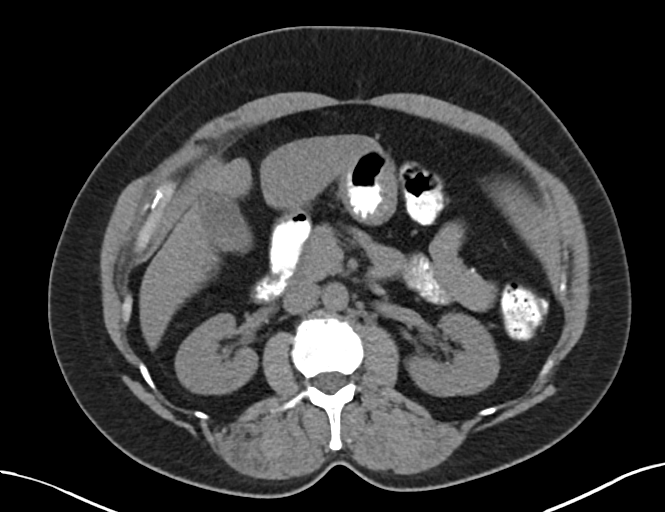
[im 61/84  soft-tissue]
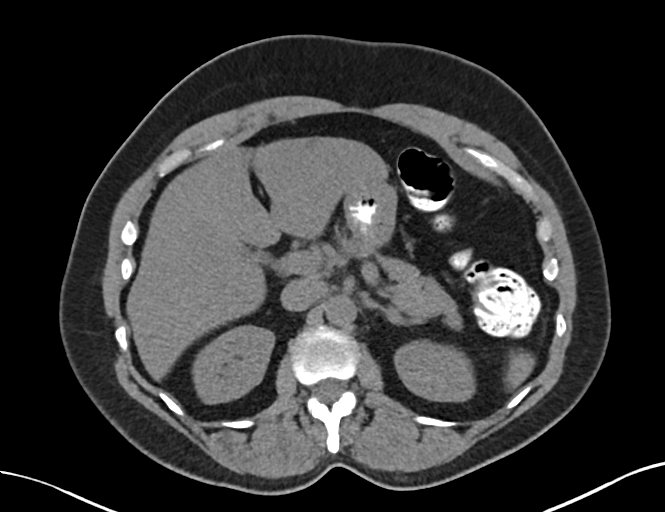
[im 67/84  soft-tissue]
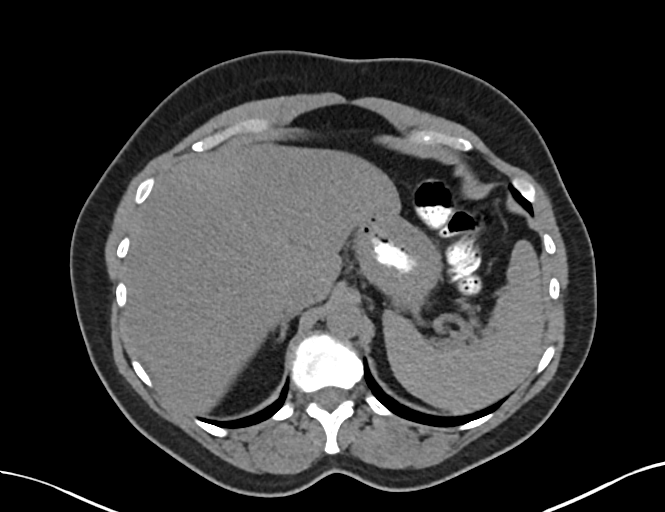
[im 67/84  bone]
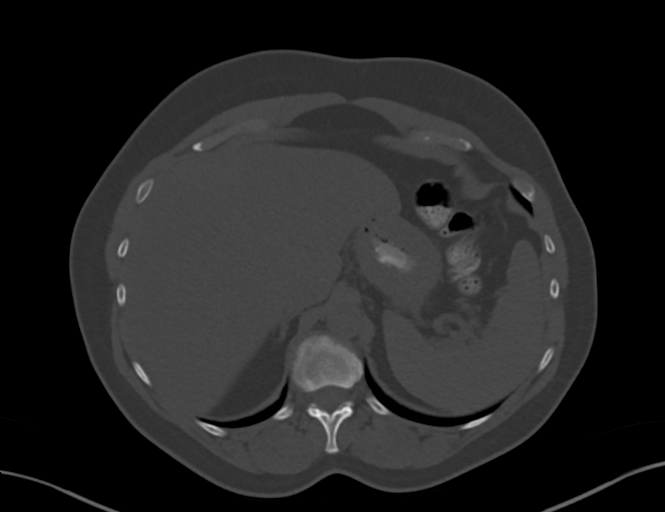
[im 78/84  soft-tissue]
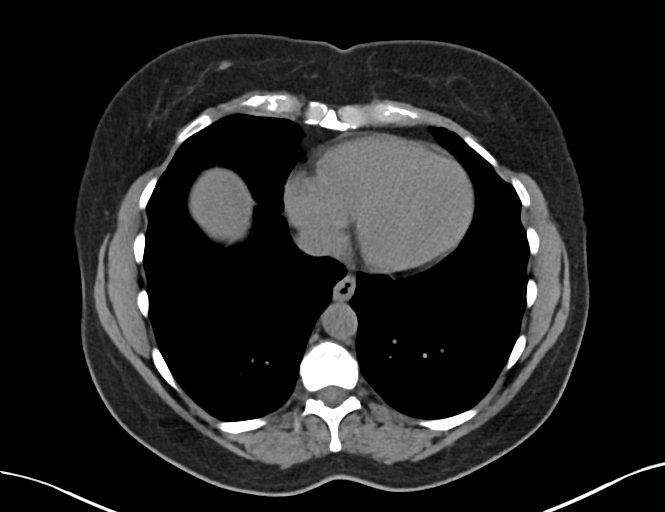

[Series 4: routine abdomen pelvis without 2.00 br40 s3 cor · coronal · non-contrast · 0.78mm/px · 3 of 147 slices shown]
[im 49/147  soft-tissue]
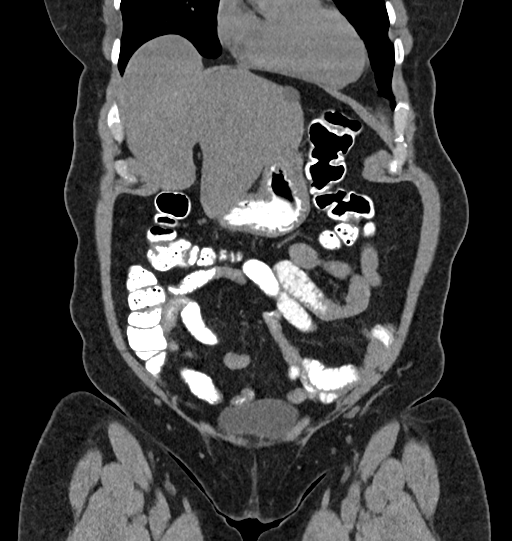
[im 65/147  soft-tissue]
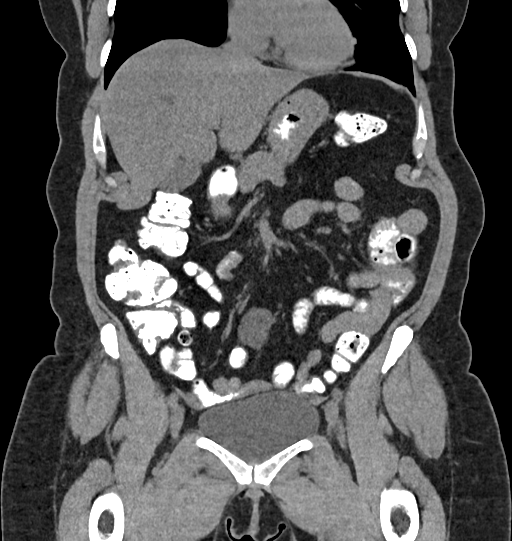
[im 82/147  soft-tissue]
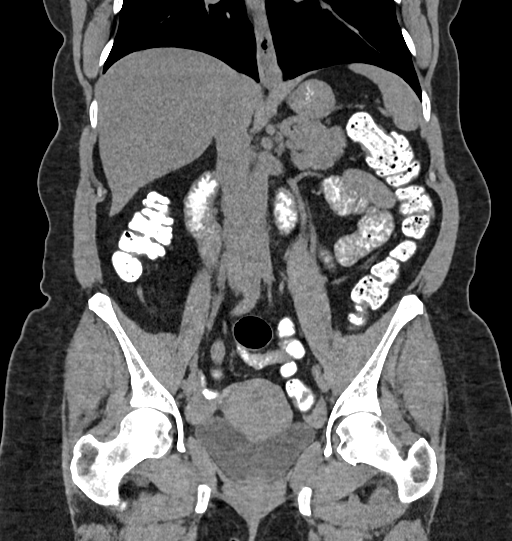

[13 of 46 positions shown; findings below may reference images not displayed]

FINDINGS: Lower chest: Lung bases are clear.  No pleural or pericardial fluid.

Hepatobiliary: 2 adjacent round low-density areas within the left
lobe of the liver measuring 1.7 cm and 1.5 cm in diameter, quite
likely to represent cysts. Third subcentimeter cyst in the central
portion of the liver. No calcified gallstones.

Pancreas: Normal

Spleen: Normal

Adrenals/Urinary Tract: Adrenal glands are normal. Kidneys are
normal. No cyst, mass, stone or hydronephrosis. Bladder is normal.

Stomach/Bowel: Stomach and small intestine are normal. The appendix
is normal. No colon abnormality is seen. There is a 3.7 cm in
diameter mass within the central mesentery. The edges are not
particularly spiculated. Internal density measurements are in the
range of 12-15 Hounsfield units.

Vascular/Lymphatic: Aorta and IVC are normal. No retroperitoneal
adenopathy.

Reproductive: Previous tubal ligation.  Otherwise normal.

Other: No free fluid or air.

Musculoskeletal: No hernia. Ordinary mild lower lumbar degenerative
changes.
IMPRESSION: 3.7 cm in diameter entity within the central mesentery. Fairly low
internal density at 12-15 Hounsfield units. I would suggest an MRI
with and without contrast to see if this represents a benign
mesenteric cyst. If this is solid, biopsy or PET scan may be
necessary.

Two round low-density areas within the left lobe of the liver quite
likely to represent adjacent benign liver cysts. Third smaller focus
within the central liver.

No other regional finding to explain the clinical presentation.

## 2021-07-08 NOTE — Progress Notes (Signed)
CT of Abdomen (07/08/2020) IMPRESSION: 3.7 cm in diameter entity within the central mesentery. Fairly low internal density at 12-15 Hounsfield units. I would suggest an MRI with and without contrast to see if this represents a benign mesenteric cyst. If this is solid, biopsy or PET scan may be necessary.   Two round low-density areas within the left lobe of the liver quite likely to represent adjacent benign liver cysts. Third smaller focus within the central liver.   No other regional finding to explain the clinical presentation.  1. Abnormal CT of the abdomen 2. Cystic disease of liver 3. Mesenteric mass Called and left message on patient's phone. I will order the MR scan to further assess the incidental finding in the mesentery.  - MR Abdomen W Wo Contrast; Future

## 2021-07-09 ENCOUNTER — Telehealth: Payer: Self-pay | Admitting: Family Medicine

## 2021-07-09 NOTE — Telephone Encounter (Signed)
Pt wants to know if she should come in to see Dr. Veto Kemps after her CT or if she should come in after her MRI? Please advise pt [phone].

## 2021-07-09 NOTE — Telephone Encounter (Signed)
Lft VM to rtn call. Dm/cma  

## 2021-07-10 ENCOUNTER — Encounter: Payer: Self-pay | Admitting: Family Medicine

## 2021-07-10 NOTE — Telephone Encounter (Signed)
Lft  detailed VM to f/u after the MRI is done. Dm/cma

## 2021-07-24 ENCOUNTER — Encounter (HOSPITAL_BASED_OUTPATIENT_CLINIC_OR_DEPARTMENT_OTHER): Payer: Self-pay | Admitting: *Deleted

## 2021-07-24 ENCOUNTER — Emergency Department (HOSPITAL_BASED_OUTPATIENT_CLINIC_OR_DEPARTMENT_OTHER): Payer: BC Managed Care – PPO

## 2021-07-24 ENCOUNTER — Emergency Department (HOSPITAL_BASED_OUTPATIENT_CLINIC_OR_DEPARTMENT_OTHER)
Admission: EM | Admit: 2021-07-24 | Discharge: 2021-07-24 | Disposition: A | Discharge status: Home / Self Care | Payer: BC Managed Care – PPO | Attending: Emergency Medicine | Admitting: Emergency Medicine

## 2021-07-24 ENCOUNTER — Other Ambulatory Visit: Payer: Self-pay

## 2021-07-24 DIAGNOSIS — R110 Nausea: Secondary | ICD-10-CM | POA: Diagnosis not present

## 2021-07-24 DIAGNOSIS — R52 Pain, unspecified: Secondary | ICD-10-CM

## 2021-07-24 DIAGNOSIS — R109 Unspecified abdominal pain: Secondary | ICD-10-CM | POA: Insufficient documentation

## 2021-07-24 DIAGNOSIS — R350 Frequency of micturition: Secondary | ICD-10-CM | POA: Diagnosis not present

## 2021-07-24 DIAGNOSIS — K7689 Other specified diseases of liver: Secondary | ICD-10-CM | POA: Diagnosis not present

## 2021-07-24 LAB — CBC WITH DIFFERENTIAL/PLATELET
Abs Immature Granulocytes: 0.01 10*3/uL (ref 0.00–0.07)
Basophils Absolute: 0 10*3/uL (ref 0.0–0.1)
Basophils Relative: 1 %
Eosinophils Absolute: 0.2 10*3/uL (ref 0.0–0.5)
Eosinophils Relative: 4 %
HCT: 40 % (ref 36.0–46.0)
Hemoglobin: 13.9 g/dL (ref 12.0–15.0)
Immature Granulocytes: 0 %
Lymphocytes Relative: 35 %
Lymphs Abs: 1.5 10*3/uL (ref 0.7–4.0)
MCH: 28.5 pg (ref 26.0–34.0)
MCHC: 34.8 g/dL (ref 30.0–36.0)
MCV: 82.1 fL (ref 80.0–100.0)
Monocytes Absolute: 0.4 10*3/uL (ref 0.1–1.0)
Monocytes Relative: 9 %
Neutro Abs: 2.2 10*3/uL (ref 1.7–7.7)
Neutrophils Relative %: 51 %
Platelets: 243 10*3/uL (ref 150–400)
RBC: 4.87 MIL/uL (ref 3.87–5.11)
RDW: 13.2 % (ref 11.5–15.5)
WBC: 4.3 10*3/uL (ref 4.0–10.5)
nRBC: 0 % (ref 0.0–0.2)

## 2021-07-24 LAB — COMPREHENSIVE METABOLIC PANEL
ALT: 26 U/L (ref 0–44)
AST: 23 U/L (ref 15–41)
Albumin: 4.5 g/dL (ref 3.5–5.0)
Alkaline Phosphatase: 60 U/L (ref 38–126)
Anion gap: 9 (ref 5–15)
BUN: 13 mg/dL (ref 6–20)
CO2: 24 mmol/L (ref 22–32)
Calcium: 9.5 mg/dL (ref 8.9–10.3)
Chloride: 108 mmol/L (ref 98–111)
Creatinine, Ser: 0.88 mg/dL (ref 0.44–1.00)
GFR, Estimated: >60 mL/min (ref >60)
Glucose, Bld: 110 mg/dL — ABNORMAL HIGH (ref 70–99)
Potassium: 3.9 mmol/L (ref 3.5–5.1)
Sodium: 141 mmol/L (ref 135–145)
Total Bilirubin: 0.7 mg/dL (ref 0.3–1.2)
Total Protein: 7.1 g/dL (ref 6.5–8.1)

## 2021-07-24 LAB — URINALYSIS, ROUTINE W REFLEX MICROSCOPIC
Bilirubin Urine: NEGATIVE
Glucose, UA: NEGATIVE mg/dL
Ketones, ur: NEGATIVE mg/dL
Leukocytes,Ua: NEGATIVE
Nitrite: NEGATIVE
Protein, ur: NEGATIVE mg/dL
Specific Gravity, Urine: 1.015 (ref 1.005–1.030)
pH: 7 (ref 5.0–8.0)

## 2021-07-24 LAB — URINALYSIS, MICROSCOPIC (REFLEX)

## 2021-07-24 LAB — LIPASE, BLOOD: Lipase: 32 U/L (ref 11–51)

## 2021-07-24 IMAGING — US US ABDOMEN COMPLETE
1 series · 13 of 25 positions shown · non-contrast
Comparison: No prior ultrasound, correlation is made with CT
abdomen pelvis [DATE]

CLINICAL DATA: Right flank and abdominal pain

EXAM:
ABDOMEN ULTRASOUND COMPLETE

[Series 1: us abdomen complete · 13 of 159 slices shown]
[im 1/159]
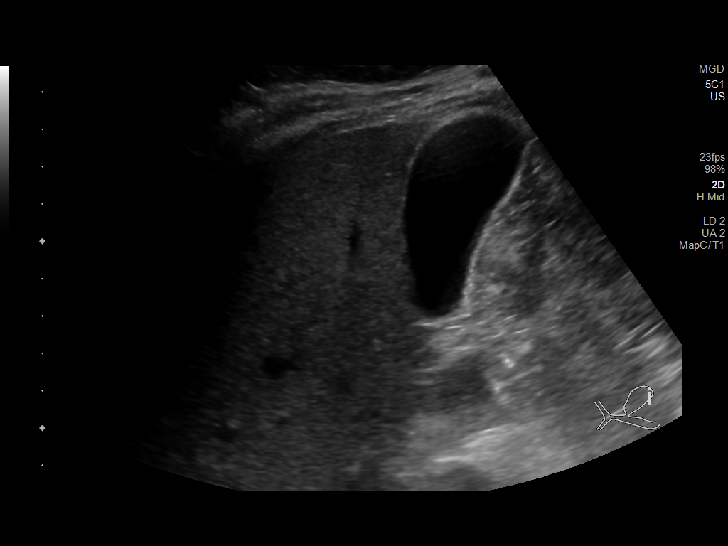
[im 14/159]
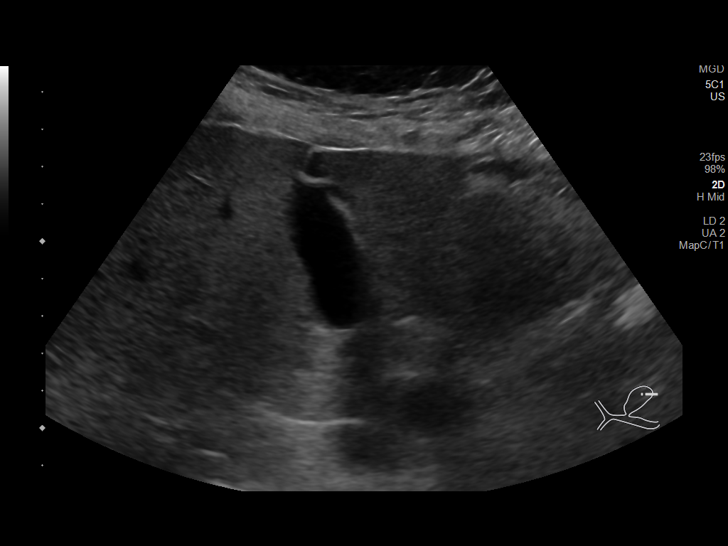
[im 27/159]
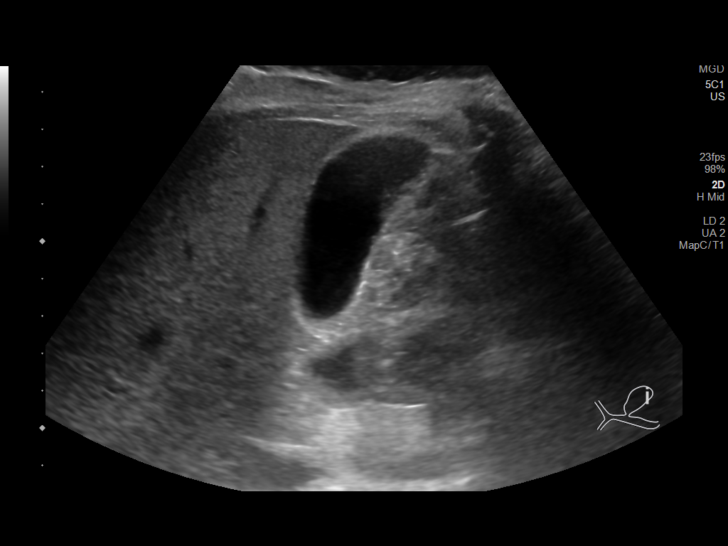
[im 40/159]
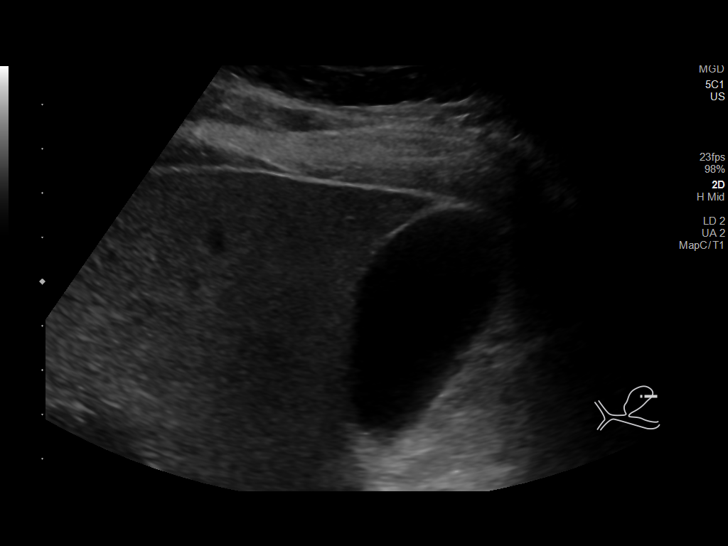
[im 53/159]
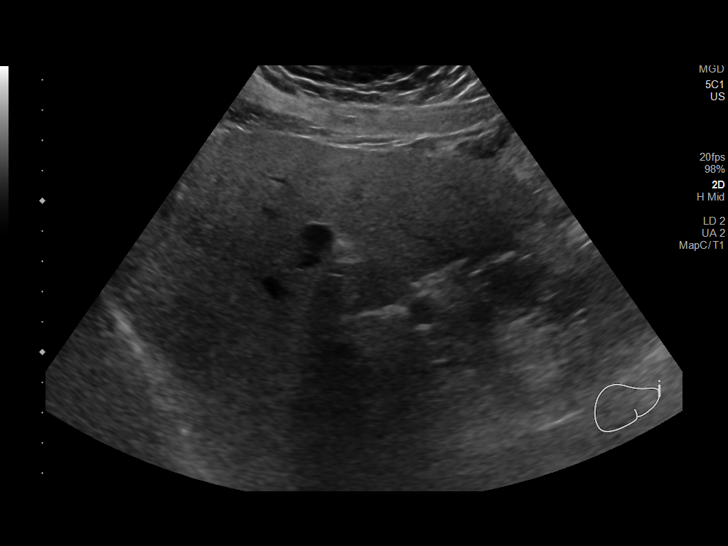
[im 66/159]
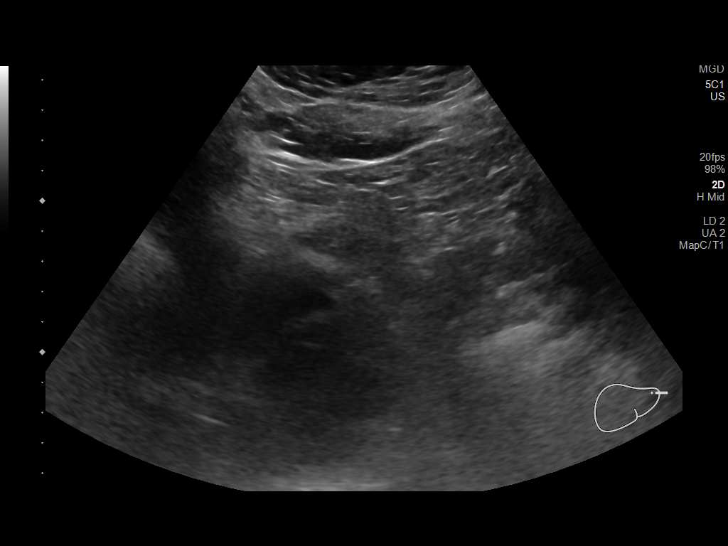
[im 80/159]
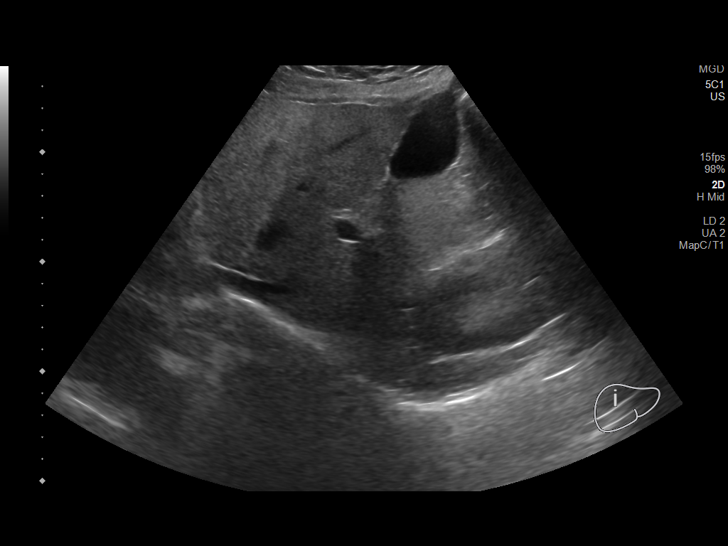
[im 93/159]
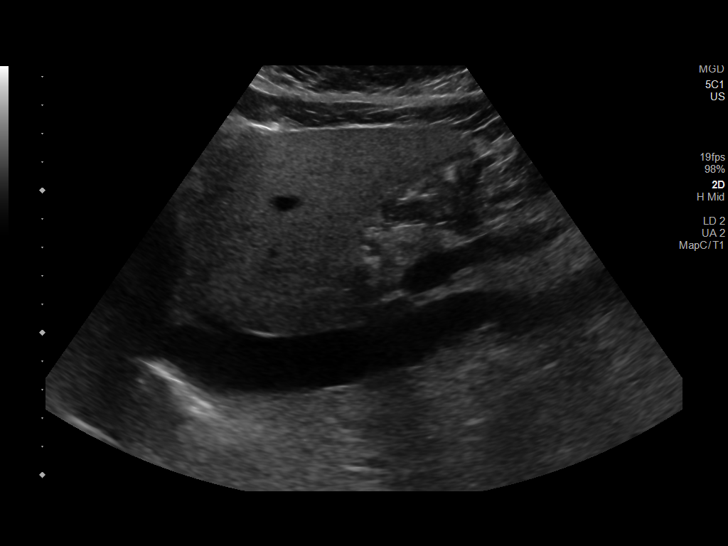
[im 106/159]
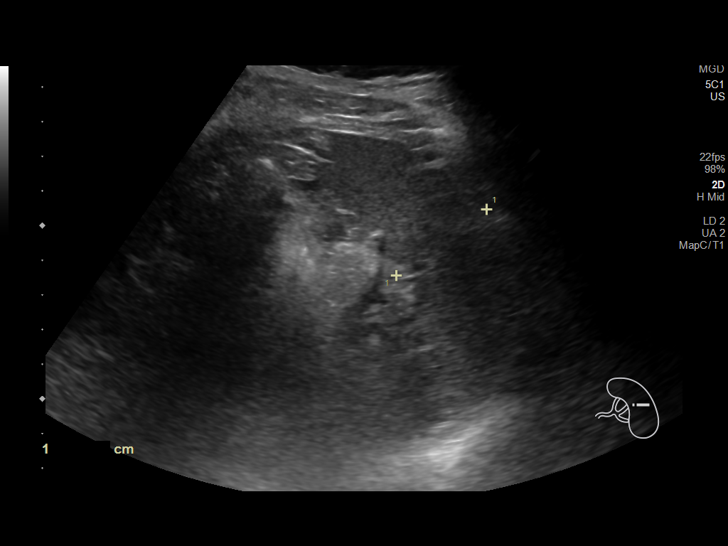
[im 119/159]
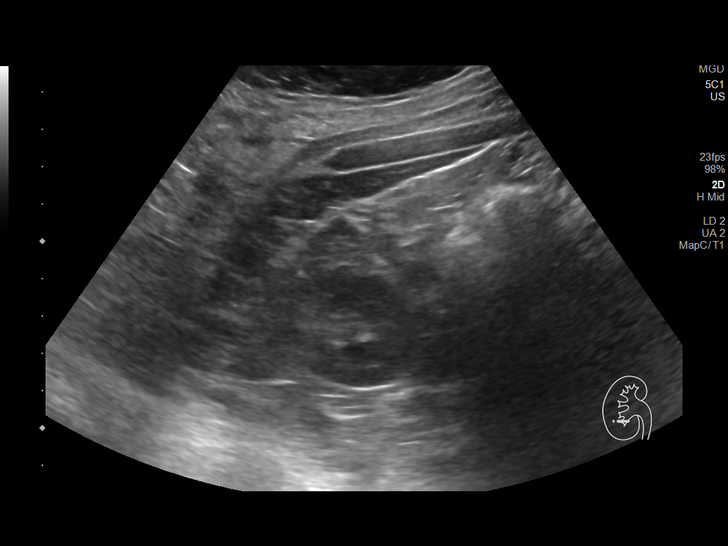
[im 132/159]
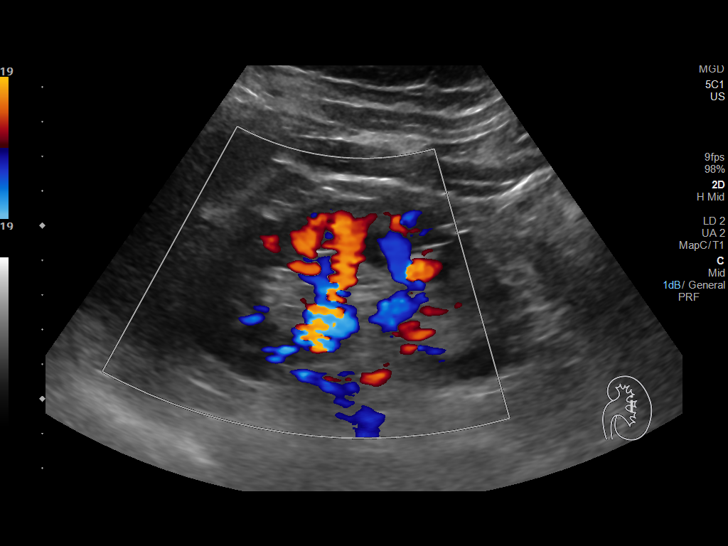
[im 145/159]
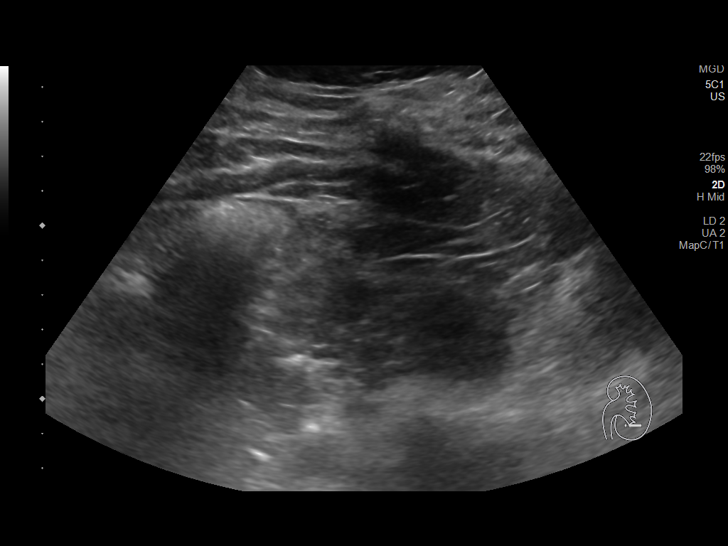
[im 159/159]
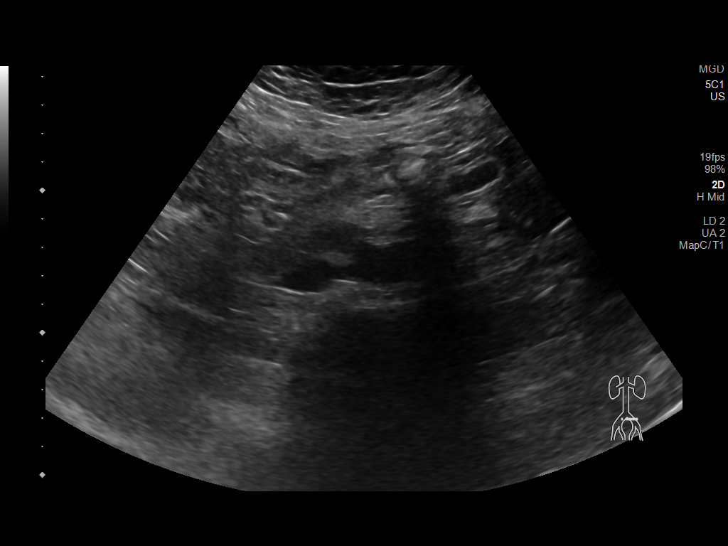

[13 of 25 positions shown; findings below may reference images not displayed]

FINDINGS: Gallbladder: No gallstones or wall thickening visualized. No
sonographic Murphy sign noted by sonographer.

Common bile duct: Diameter: 4 mm

Liver: Redemonstrated left lobe cyst with septation or 2 adjacent
hepatic cysts, as seen on the prior CT, which measures 2.7 x 2.4 x
2.7 cm overall. Within normal limits in parenchymal echogenicity.
Portal vein is patent on color Doppler imaging with normal direction
of blood flow towards the liver.

IVC: No abnormality visualized.

Pancreas: Not well visualized due to overlying bowel gas.

Spleen: Size and appearance within normal limits.

Right Kidney: Length: 11.7 cm. Echogenicity within normal limits. No
mass or hydronephrosis visualized.

Left Kidney: Length: 10.3 cm. Echogenicity within normal limits. No
mass or hydronephrosis visualized.

Abdominal aorta: No aneurysm visualized.  Measures up to 1.8 cm.

Other findings: None.
IMPRESSION: 1. No etiology is seen for the patient's abdominal pain.
2. Benign appearing cystic lesions in the left hepatic lobe, which
[DATE] septated cyst or 2 adjacent cysts. These correlate
with the low-density lesion seen on the [DATE] CT.

## 2021-07-24 MED ORDER — SODIUM CHLORIDE 0.9 % IV BOLUS
1000.0000 mL | Freq: Once | INTRAVENOUS | Status: AC
  Administered 2021-07-24: 1000 mL via INTRAVENOUS

## 2021-07-24 MED ORDER — MORPHINE SULFATE (PF) 4 MG/ML IV SOLN
4.0000 mg | Freq: Once | INTRAVENOUS | Status: AC
  Administered 2021-07-24: 4 mg via INTRAVENOUS
  Filled 2021-07-24: qty 1

## 2021-07-24 MED ORDER — METHOCARBAMOL 500 MG PO TABS: 500.0000 mg | ORAL_TABLET | Freq: Two times a day (BID) | ORAL | 0 refills | Status: DC | PRN

## 2021-07-24 MED ORDER — KETOROLAC TROMETHAMINE 30 MG/ML IJ SOLN
30.0000 mg | Freq: Once | INTRAMUSCULAR | Status: AC
  Administered 2021-07-24: 30 mg via INTRAVENOUS
  Filled 2021-07-24: qty 1

## 2021-07-24 NOTE — ED Notes (Signed)
ED Provider at bedside. 

## 2021-07-24 NOTE — ED Notes (Signed)
Resting quietly, awaiting for Abdominal US to be done. Husband at bedside

## 2021-07-24 NOTE — ED Notes (Signed)
Urine spec to lab, clear, straw colored urine

## 2021-07-24 NOTE — Discharge Instructions (Addendum)
You were seen in the emergency department for continued right flank pain.  You had lab work urinalysis and an ultrasound that did not show an obvious explanation for your pain.  We are treating with a muscle relaxant.  Please follow-up with your primary care doctor for further evaluation.  Return to the emergency department if any worsening or concerning symptoms

## 2021-07-24 NOTE — ED Provider Notes (Signed)
[facility] EMERGENCY DEPARTMENT Provider Note   CSN: [id number] Arrival date & time: 07/24/21  3419     History  Chief Complaint  Patient presents with   Back Pain    Theresa Gonzalez is a 56 y.o. female.  She has been having right flank pain that is been going on for 3 weeks.  Had an outpatient CT showing mesenteric mass and some liver cysts.  Pain acutely worsened this morning.  Associated with some urinary frequency.  No hematuria.  Has had nausea intermittently.  No headache chest pain shortness of breath abdominal pain vomiting diarrhea.  Is awaiting outpatient MRI scheduled for this week.  No fevers or chills.  Has tried nothing for this pain.  The history is provided by the patient.  Flank Pain This is a recurrent problem. The current episode started 1 to 2 hours ago. The problem occurs constantly. The problem has not changed since onset.Pertinent negatives include no chest pain, no abdominal pain, no headaches and no shortness of breath. Nothing aggravates the symptoms. Nothing relieves the symptoms. She has tried nothing for the symptoms. The treatment provided no relief.      Home Medications Prior to Admission medications   Medication Sig Start Date End Date Taking? Authorizing Provider  albuterol (VENTOLIN HFA) 108 (90 Base) MCG/ACT inhaler Inhale into the lungs. 08/20/20   [provider]  EPINEPHrine 0.3 mg/0.3 mL IJ SOAJ injection Inject into the muscle. 08/20/20   [provider]  famotidine (PEPCID) 20 MG tablet Take one tablet twice a day to control reflux 02/26/21   Ambs, Norvel Richards, FNP  fenofibrate (TRICOR) 145 MG tablet Take 1 tablet (145 mg total) by mouth daily. 12/19/20   Cirigliano, Mary K, DO  fluticasone (FLONASE) 50 MCG/ACT nasal spray 2 sprays per nostril once a day as needed for stuffy nose. 02/26/21   Hetty Blend, FNP  levocetirizine (XYZAL) 5 MG tablet Take 1 tablet (5 mg total) by mouth every evening. 01/12/21   Alfonse Spruce,  MD  ondansetron (ZOFRAN) 4 MG tablet Take 1 tablet (4 mg total) by mouth every 8 (eight) hours as needed for nausea or vomiting. 05/17/21   [name], PA-C  pramipexole (MIRAPEX) 1 MG tablet Take 1 tablet (1 mg total) by mouth at bedtime. 11/28/20   Cirigliano, Jearld Lesch, DO      Allergies    Penicillins, Potassium citrate, Ropinirole, Cetirizine hcl, Nabumetone, Nsaids, and Sodium hyaluronate    Review of Systems   Review of Systems  Constitutional:  Negative for fever.  HENT:  Negative for sore throat.   Eyes:  Negative for visual disturbance.  Respiratory:  Negative for shortness of breath.   Cardiovascular:  Negative for chest pain.  Gastrointestinal:  Positive for nausea. Negative for abdominal pain and vomiting.  Genitourinary:  Positive for flank pain and frequency. Negative for dysuria.  Musculoskeletal:  Positive for back pain.  Skin:  Negative for rash.  Neurological:  Negative for weakness, numbness and headaches.   Physical Exam Updated Vital Signs BP 132/60    Pulse 64    Resp 16    Ht 5\' 3"  (1.6 m)    Wt 72.6 kg    SpO2 99%    BMI 28.34 kg/m  Physical Exam Vitals and nursing note reviewed.  Constitutional:      General: She is not in acute distress.    Appearance: Normal appearance. She is well-developed.  HENT:     Head: Normocephalic  and atraumatic.  Eyes:     Conjunctiva/sclera: Conjunctivae normal.  Cardiovascular:     Rate and Rhythm: Normal rate and regular rhythm.     Heart sounds: No murmur heard. Pulmonary:     Effort: Pulmonary effort is normal. No respiratory distress.     Breath sounds: Normal breath sounds.  Abdominal:     Palpations: Abdomen is soft.     Tenderness: There is no abdominal tenderness. There is no guarding or rebound.  Musculoskeletal:        General: No swelling.     Cervical back: Neck supple.     Comments: No midline spine tenderness.  Does have some right flank tenderness.  No right upper quadrant tenderness.  Skin:     General: Skin is warm and dry.     Capillary Refill: Capillary refill takes less than 2 seconds.  Neurological:     General: No focal deficit present.     Mental Status: She is alert.  Psychiatric:        Mood and Affect: Mood normal.    ED Results / Procedures / Treatments   Labs (all labs ordered are listed, but only abnormal results are displayed) Labs Reviewed  URINALYSIS, ROUTINE W REFLEX MICROSCOPIC - Abnormal; Notable for the following components:      Result Value   Color, Urine STRAW (*)    Hgb urine dipstick TRACE (*)    All other components within normal limits  COMPREHENSIVE METABOLIC PANEL - Abnormal; Notable for the following components:   Glucose, Bld 110 (*)    All other components within normal limits  URINALYSIS, MICROSCOPIC (REFLEX) - Abnormal; Notable for the following components:   Bacteria, UA RARE (*)    All other components within normal limits  CBC WITH DIFFERENTIAL/PLATELET  LIPASE, BLOOD    EKG None  Radiology US Abdomen Complete  Result Date: 07/24/2021 CLINICAL DATA:  Right flank and abdominal pain EXAM: ABDOMEN ULTRASOUND COMPLETE COMPARISON:  No prior ultrasound, correlation is made with CT abdomen pelvis 07/08/2021 FINDINGS: Gallbladder: No gallstones or wall thickening visualized. No sonographic Murphy sign noted by sonographer. Common bile duct: Diameter: 4 mm Liver: Redemonstrated left lobe cyst with septation or 2 adjacent hepatic cysts, as seen on the prior CT, which measures 2.7 x 2.4 x 2.7 cm overall. Within normal limits in parenchymal echogenicity. Portal vein is patent on color Doppler imaging with normal direction of blood flow towards the liver. IVC: No abnormality visualized. Pancreas: Not well visualized due to overlying bowel gas. Spleen: Size and appearance within normal limits. Right Kidney: Length: 11.7 cm. Echogenicity within normal limits. No mass or hydronephrosis visualized. Left Kidney: Length: 10.3 cm. Echogenicity within  normal limits. No mass or hydronephrosis visualized. Abdominal aorta: No aneurysm visualized.  Measures up to 1.8 cm. Other findings: None. IMPRESSION: 1. No etiology is seen for the patient's abdominal pain. 2. Benign appearing cystic lesions in the left hepatic lobe, which may represent 1 septated cyst or 2 adjacent cysts. These correlate with the low-density lesion seen on the 07/08/2021 CT. Electronically Signed   By: Merilyn Baba M.D.   On: 07/24/2021 11:50    Procedures Procedures    Medications Ordered in ED Medications  sodium chloride 0.9 % bolus 1,000 mL (0 mLs Intravenous Stopped 07/24/21 0902)  morphine 4 MG/ML injection 4 mg (4 mg Intravenous Given 07/24/21 0803)  ketorolac (TORADOL) 30 MG/ML injection 30 mg (30 mg Intravenous Given 07/24/21 1231)    ED Course/ Medical Decision  Making/ A&P Clinical Course as of 07/24/21 1929  Ludwig Clarks Jul 24, 2021  1219 Patient's work-up has been fairly unremarkable.  Offered CT with contrast and she declines.  She has an MRI scheduled in 4 days.  She is asking for a dose of Toradol which has had in the past.  NSAIDs are listed as an allergy with rash although she said she said Toradol with no problems.  She declines any narcotics.  She said she would try a muscle relaxant.  Return instructions discussed [MB]    Clinical Course User Index [MB] Hayden Rasmussen, MD                           Medical Decision Making Amount and/or Complexity of Data Reviewed Labs: ordered. Radiology: ordered.  Risk Prescription drug management.  This patient complains of right flank pain; this involves an extensive number of treatment Options and is a complaint that carries with it a high risk of complications and Morbidity. The differential includes musculoskeletal pain, renal colic, cholelithiasis, cholecystitis, radiculopathy, pneumonia, pneumothorax  I ordered, reviewed and interpreted labs, which included CBC with normal white count normal hemoglobin,  chemistries and LFTs normal, urinalysis unremarkable, lipase normal I ordered medication IV fluids and pain medication with improvement of patient's symptoms I ordered imaging studies which included abdominal ultrasound and I independently    visualized and interpreted imaging which showed no acute findings Additional history obtained from patient significant other Previous records obtained and reviewed in epic including prior PCP visit and CT abdomen and pelvis After the interventions stated above, I reevaluated the patient and found patient to be hemodynamically stable.  Offered pain medication for discharge which she declines.  No indications for admission at this time.  I did also offer a CT with IV contrast to exclude possible vascular cause.  She declined CT at this time is going to follow-up for her outpatient MRI.  Return instructions discussed.          Final Clinical Impression(s) / ED Diagnoses Final diagnoses:  Right flank pain    Rx / DC Orders ED Discharge Orders          Ordered    methocarbamol (ROBAXIN) 500 MG tablet  2 times daily PRN        07/24/21 1221              Hayden Rasmussen, MD 07/24/21 1931

## 2021-07-24 NOTE — ED Triage Notes (Signed)
Awoke this am at approx 0400hrs with rt lower back pain, denies any fever, states having urinary frequency.

## 2021-07-27 ENCOUNTER — Encounter: Payer: Self-pay | Admitting: Family Medicine

## 2021-07-28 ENCOUNTER — Ambulatory Visit
Admission: RE | Admit: 2021-07-28 | Discharge: 2021-07-28 | Disposition: A | Discharge status: Home / Self Care | Payer: BC Managed Care – PPO | Source: Ambulatory Visit | Attending: Family Medicine | Admitting: Family Medicine

## 2021-07-28 DIAGNOSIS — R935 Abnormal findings on diagnostic imaging of other abdominal regions, including retroperitoneum: Secondary | ICD-10-CM

## 2021-07-28 DIAGNOSIS — K820 Obstruction of gallbladder: Secondary | ICD-10-CM | POA: Diagnosis not present

## 2021-07-28 DIAGNOSIS — K449 Diaphragmatic hernia without obstruction or gangrene: Secondary | ICD-10-CM | POA: Diagnosis not present

## 2021-07-28 DIAGNOSIS — K6389 Other specified diseases of intestine: Secondary | ICD-10-CM

## 2021-07-28 DIAGNOSIS — K760 Fatty (change of) liver, not elsewhere classified: Secondary | ICD-10-CM | POA: Diagnosis not present

## 2021-07-28 DIAGNOSIS — Q446 Cystic disease of liver: Secondary | ICD-10-CM

## 2021-07-28 DIAGNOSIS — K639 Disease of intestine, unspecified: Secondary | ICD-10-CM | POA: Diagnosis not present

## 2021-07-28 IMAGING — MR MR ABDOMEN WO/W CM
11 of 17 series · 28 of 48 positions shown · IV contrast (15ml Multihance)
Comparison: CT abdomen pelvis, [DATE]

CLINICAL DATA: Mesenteric mass incidentally identified by recent
CT, liver cysts

EXAM:
MRI ABDOMEN WITHOUT AND WITH CONTRAST
TECHNIQUE: Multiplanar multisequence MR imaging of the abdomen was performed
both before and after the administration of intravenous contrast.
CONTRAST:  15mL MULTIHANCE GADOBENATE DIMEGLUMINE 529 MG/ML IV SOLN

[Series 4: cor haste · coronal · 5.0mm · 0.68mm/px · 2 of 33 slices shown]
[im 1/33]
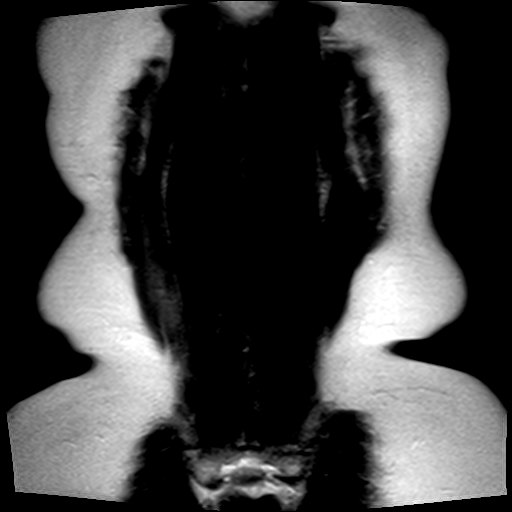
[im 33/33]
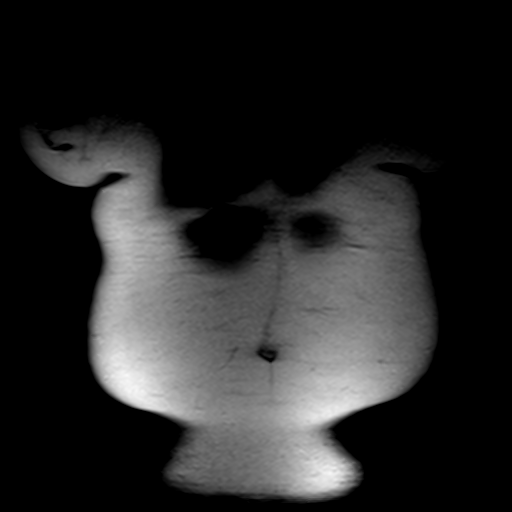

[Series 5: axial haste · axial · 6.0mm · 0.68mm/px · z∈[+35,+246]mm · 2 of 33 slices shown]
[im 1/33]
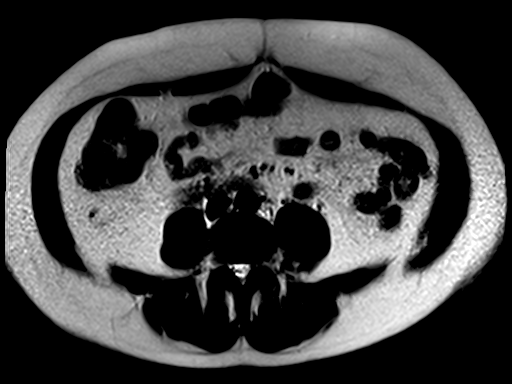
[im 33/33]
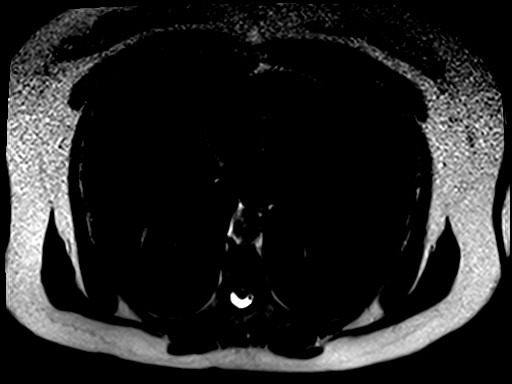

[Series 6: T1 · axial · 6.0mm · 0.68mm/px · z∈[+31,+242]mm · 4 of 66 slices shown]
[im 1/66]
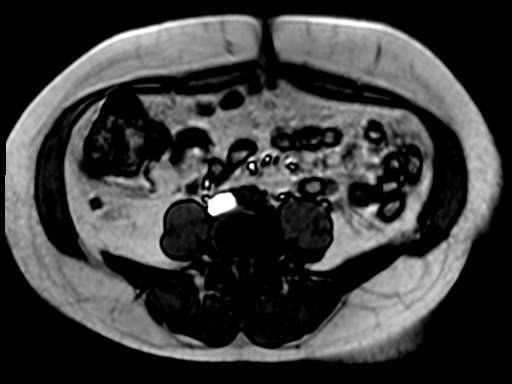
[im 22/66]
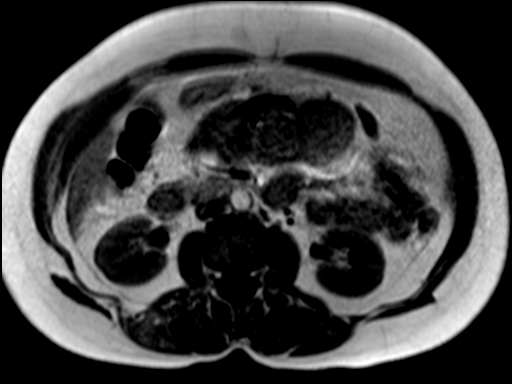
[im 44/66]
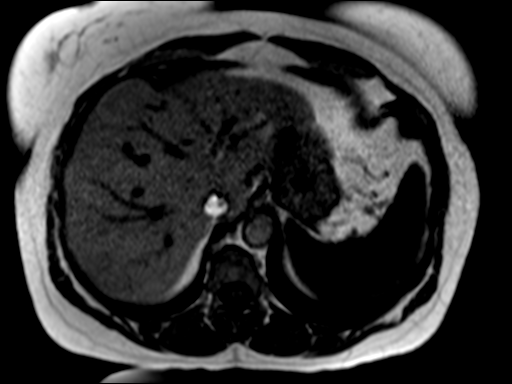
[im 66/66]
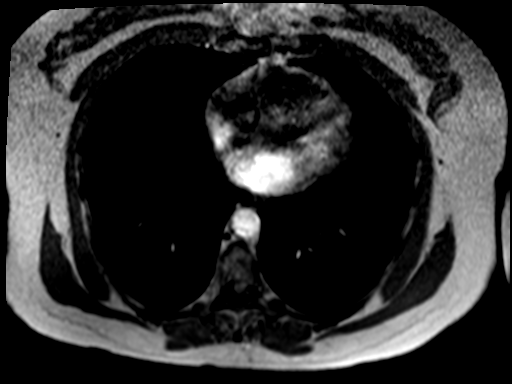

[Series 7: bSSFP · axial · 4.0mm · 0.68mm/px · z∈[-2,+238]mm · 3 of 61 slices shown]
[im 1/61]
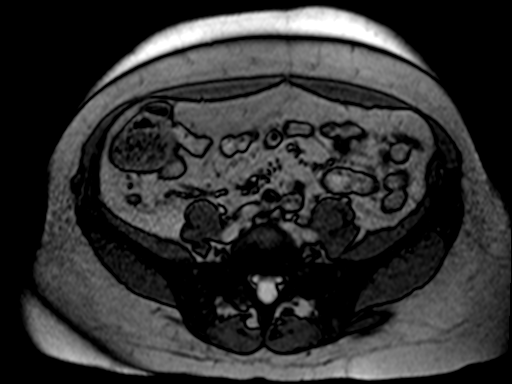
[im 31/61]
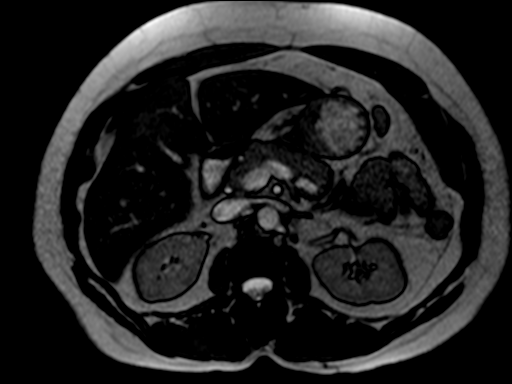
[im 61/61]
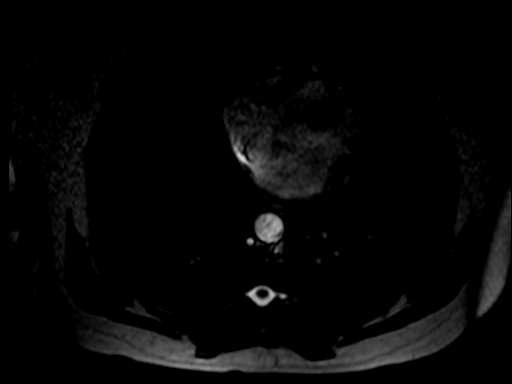

[Series 8: T2 fat-sat · axial · 6.0mm · 1.09mm/px · z∈[+25,+248]mm · 2 of 32 slices shown]
[im 1/32]
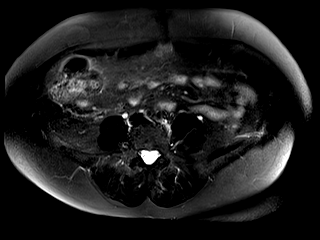
[im 32/32]
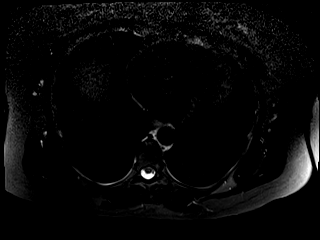

[Series 9: ep2d_[id number]_500_800_p2_trig · axial · 6.0mm · 1.82mm/px · z∈[+15,+239]mm · 4 of 96 slices shown]
[im 1/96]
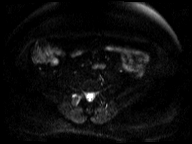
[im 32/96]
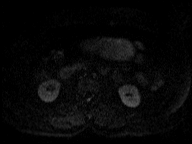
[im 64/96]
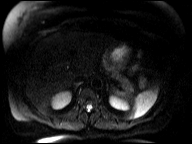
[im 96/96]
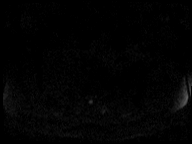

[Series 10: ep2d_[id number]_500_800_p2_trig_adc · axial · 6.0mm · 1.82mm/px · 1 of 32 slices shown]
[im 1/32]
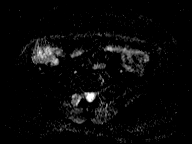

[Series 11: T1 dynamic · axial · non-contrast · 2.5mm · 0.74mm/px · z∈[-2,+215]mm · 3 of 88 slices shown]
[im 1/88]
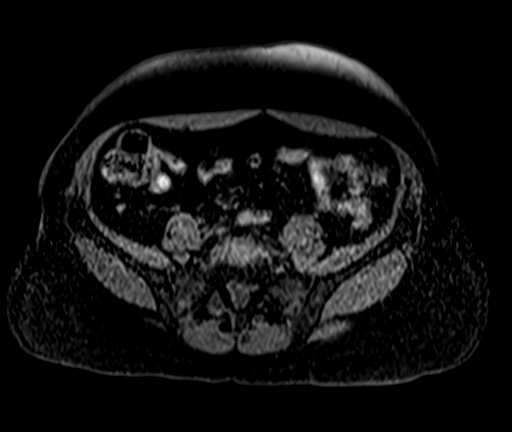
[im 44/88]
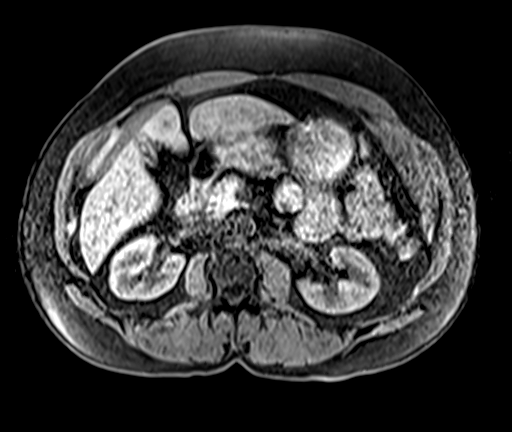
[im 88/88]
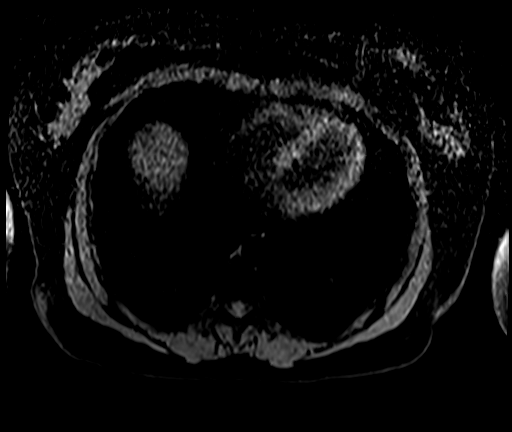

[Series 12: T1 dynamic post-contrast · axial · 2.5mm · 0.74mm/px · z∈[-2,+215]mm · 3 of 88 slices shown (1 of 3)]
[im 1/88]
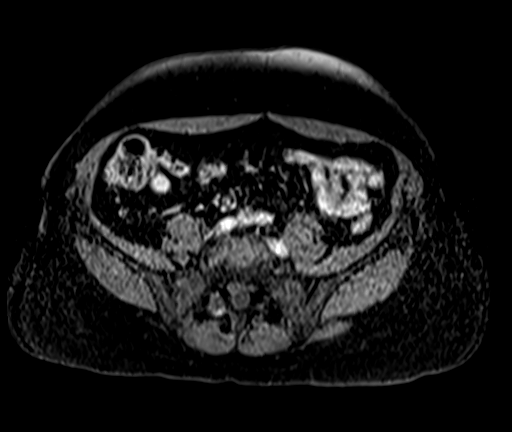
[im 44/88]
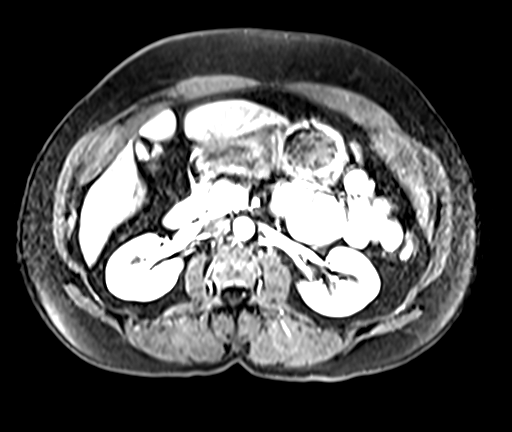
[im 88/88]
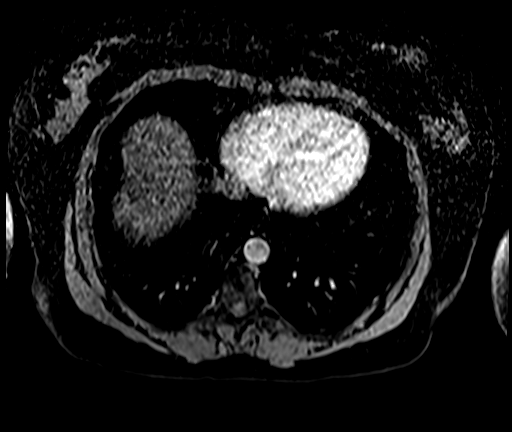

[Series 13: T1 dynamic post-contrast · axial · 2.5mm · 0.74mm/px · z∈[-2,+215]mm · 3 of 88 slices shown (2 of 3)]
[im 1/88]
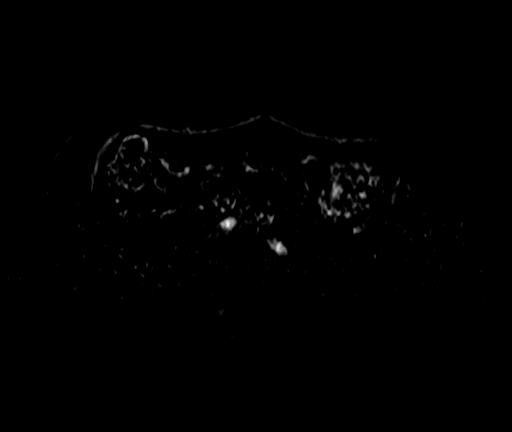
[im 44/88]
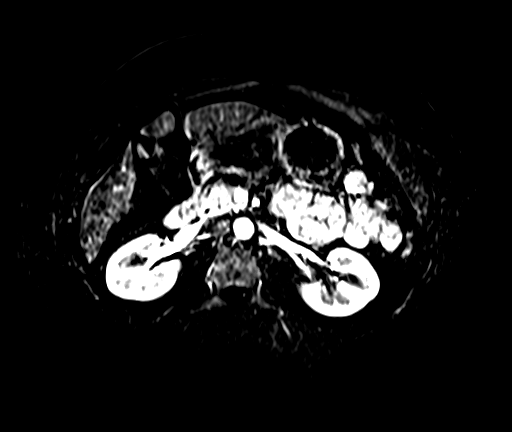
[im 88/88]
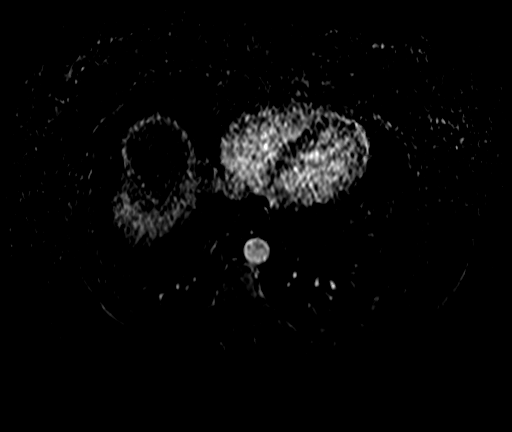

[Series 14: T1 dynamic post-contrast · axial · 2.5mm · 0.74mm/px · 1 of 88 slices shown (3 of 3)]
[im 1/88]
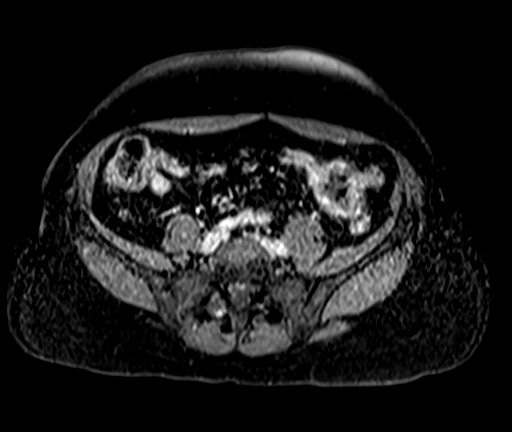

[28 of 48 positions shown; findings below may reference images not displayed]

FINDINGS: Lower chest: No acute findings.  Small hiatal hernia.

Hepatobiliary: Hepatic steatosis. No mass or other parenchymal
abnormality identified. Multiple small, benign cysts of the left and
right lobes of the liver (e.g. Series 5, image 10). Contracted
gallbladder. No gallstones. No biliary ductal dilatation.

Pancreas: No mass, inflammatory changes, or other parenchymal
abnormality identified.No pancreatic ductal dilatation.

Spleen:  Within normal limits in size and appearance.

Adrenals/Urinary Tract: Normal adrenal glands. No renal masses or
suspicious contrast enhancement identified. No evidence of
hydronephrosis.

Stomach/Bowel: Visualized portions within the abdomen are
unremarkable.

Vascular/Lymphatic: No pathologically enlarged lymph nodes
identified. No abdominal aortic aneurysm demonstrated.

Other: A lesion of the central small bowel mesentery in the superior
pelvis seen on prior CT is only partially included on this
examination, seen in coronal series only measuring approximately
x 2.7 cm (series 4, image 22, series 18, image 53). On the included
sequences, this appears to be a thinly septated cyst without
associated contrast enhancement or solid component (series 4, image
22).

Musculoskeletal: No suspicious osseous lesions identified.
IMPRESSION: 1. A lesion of the central small bowel mesentery in the superior
pelvis seen on prior CT is only partially included on this
examination of the abdomen, seen in coronal series only. On the
included sequences, this appears to be a thinly septated cyst
without associated contrast enhancement or solid component. This
most likely reflects a benign mesenteric cyst.
2. Multiple small, benign simple cysts of the liver, for which no
further follow-up or characterization is required.
3. Hepatic steatosis.
4. Small hiatal hernia.

## 2021-07-28 MED ORDER — GADOBENATE DIMEGLUMINE 529 MG/ML IV SOLN
15.0000 mL | Freq: Once | INTRAVENOUS | Status: AC | PRN
  Administered 2021-07-28: 15 mL via INTRAVENOUS

## 2021-07-30 ENCOUNTER — Emergency Department (HOSPITAL_BASED_OUTPATIENT_CLINIC_OR_DEPARTMENT_OTHER): Payer: BC Managed Care – PPO

## 2021-07-30 ENCOUNTER — Other Ambulatory Visit: Payer: Self-pay

## 2021-07-30 ENCOUNTER — Other Ambulatory Visit: Payer: Self-pay | Admitting: Family Medicine

## 2021-07-30 ENCOUNTER — Emergency Department (HOSPITAL_BASED_OUTPATIENT_CLINIC_OR_DEPARTMENT_OTHER)
Admission: EM | Admit: 2021-07-30 | Discharge: 2021-07-30 | Disposition: A | Discharge status: Home / Self Care | Payer: BC Managed Care – PPO | Attending: Emergency Medicine | Admitting: Emergency Medicine

## 2021-07-30 ENCOUNTER — Encounter (HOSPITAL_BASED_OUTPATIENT_CLINIC_OR_DEPARTMENT_OTHER): Payer: Self-pay

## 2021-07-30 DIAGNOSIS — R059 Cough, unspecified: Secondary | ICD-10-CM | POA: Diagnosis not present

## 2021-07-30 DIAGNOSIS — T7840XA Allergy, unspecified, initial encounter: Secondary | ICD-10-CM | POA: Diagnosis not present

## 2021-07-30 DIAGNOSIS — K668 Other specified disorders of peritoneum: Secondary | ICD-10-CM | POA: Diagnosis not present

## 2021-07-30 DIAGNOSIS — N951 Menopausal and female climacteric states: Secondary | ICD-10-CM | POA: Diagnosis not present

## 2021-07-30 DIAGNOSIS — G479 Sleep disorder, unspecified: Secondary | ICD-10-CM | POA: Diagnosis not present

## 2021-07-30 DIAGNOSIS — R1031 Right lower quadrant pain: Secondary | ICD-10-CM | POA: Diagnosis not present

## 2021-07-30 DIAGNOSIS — R109 Unspecified abdominal pain: Secondary | ICD-10-CM | POA: Diagnosis not present

## 2021-07-30 LAB — COMPREHENSIVE METABOLIC PANEL
ALT: 27 U/L (ref 0–44)
AST: 25 U/L (ref 15–41)
Albumin: 4.7 g/dL (ref 3.5–5.0)
Alkaline Phosphatase: 66 U/L (ref 38–126)
Anion gap: 9 (ref 5–15)
BUN: 16 mg/dL (ref 6–20)
CO2: 27 mmol/L (ref 22–32)
Calcium: 9.7 mg/dL (ref 8.9–10.3)
Chloride: 105 mmol/L (ref 98–111)
Creatinine, Ser: 0.83 mg/dL (ref 0.44–1.00)
GFR, Estimated: >60 mL/min (ref >60)
Glucose, Bld: 85 mg/dL (ref 70–99)
Potassium: 3.9 mmol/L (ref 3.5–5.1)
Sodium: 141 mmol/L (ref 135–145)
Total Bilirubin: 0.4 mg/dL (ref 0.3–1.2)
Total Protein: 7.2 g/dL (ref 6.5–8.1)

## 2021-07-30 LAB — CBC
HCT: 39.2 % (ref 36.0–46.0)
Hemoglobin: 13.5 g/dL (ref 12.0–15.0)
MCH: 28.8 pg (ref 26.0–34.0)
MCHC: 34.4 g/dL (ref 30.0–36.0)
MCV: 83.6 fL (ref 80.0–100.0)
Platelets: 274 10*3/uL (ref 150–400)
RBC: 4.69 MIL/uL (ref 3.87–5.11)
RDW: 13.4 % (ref 11.5–15.5)
WBC: 5.8 10*3/uL (ref 4.0–10.5)
nRBC: 0 % (ref 0.0–0.2)

## 2021-07-30 LAB — URINALYSIS, ROUTINE W REFLEX MICROSCOPIC
Bilirubin Urine: NEGATIVE
Glucose, UA: NEGATIVE mg/dL
Hgb urine dipstick: NEGATIVE
Ketones, ur: NEGATIVE mg/dL
Nitrite: NEGATIVE
Protein, ur: NEGATIVE mg/dL
Specific Gravity, Urine: 1.015 (ref 1.005–1.030)
pH: 6 (ref 5.0–8.0)

## 2021-07-30 LAB — URINALYSIS, MICROSCOPIC (REFLEX): RBC / HPF: NONE SEEN RBC/hpf (ref 0–5)

## 2021-07-30 LAB — LIPASE, BLOOD: Lipase: 35 U/L (ref 11–51)

## 2021-07-30 IMAGING — DX DG CHEST 2V
2 series · 2 of 2 positions shown · non-contrast
Comparison: None.

CLINICAL DATA: Cough

EXAM:
CHEST - 2 VIEW

[chest pa]
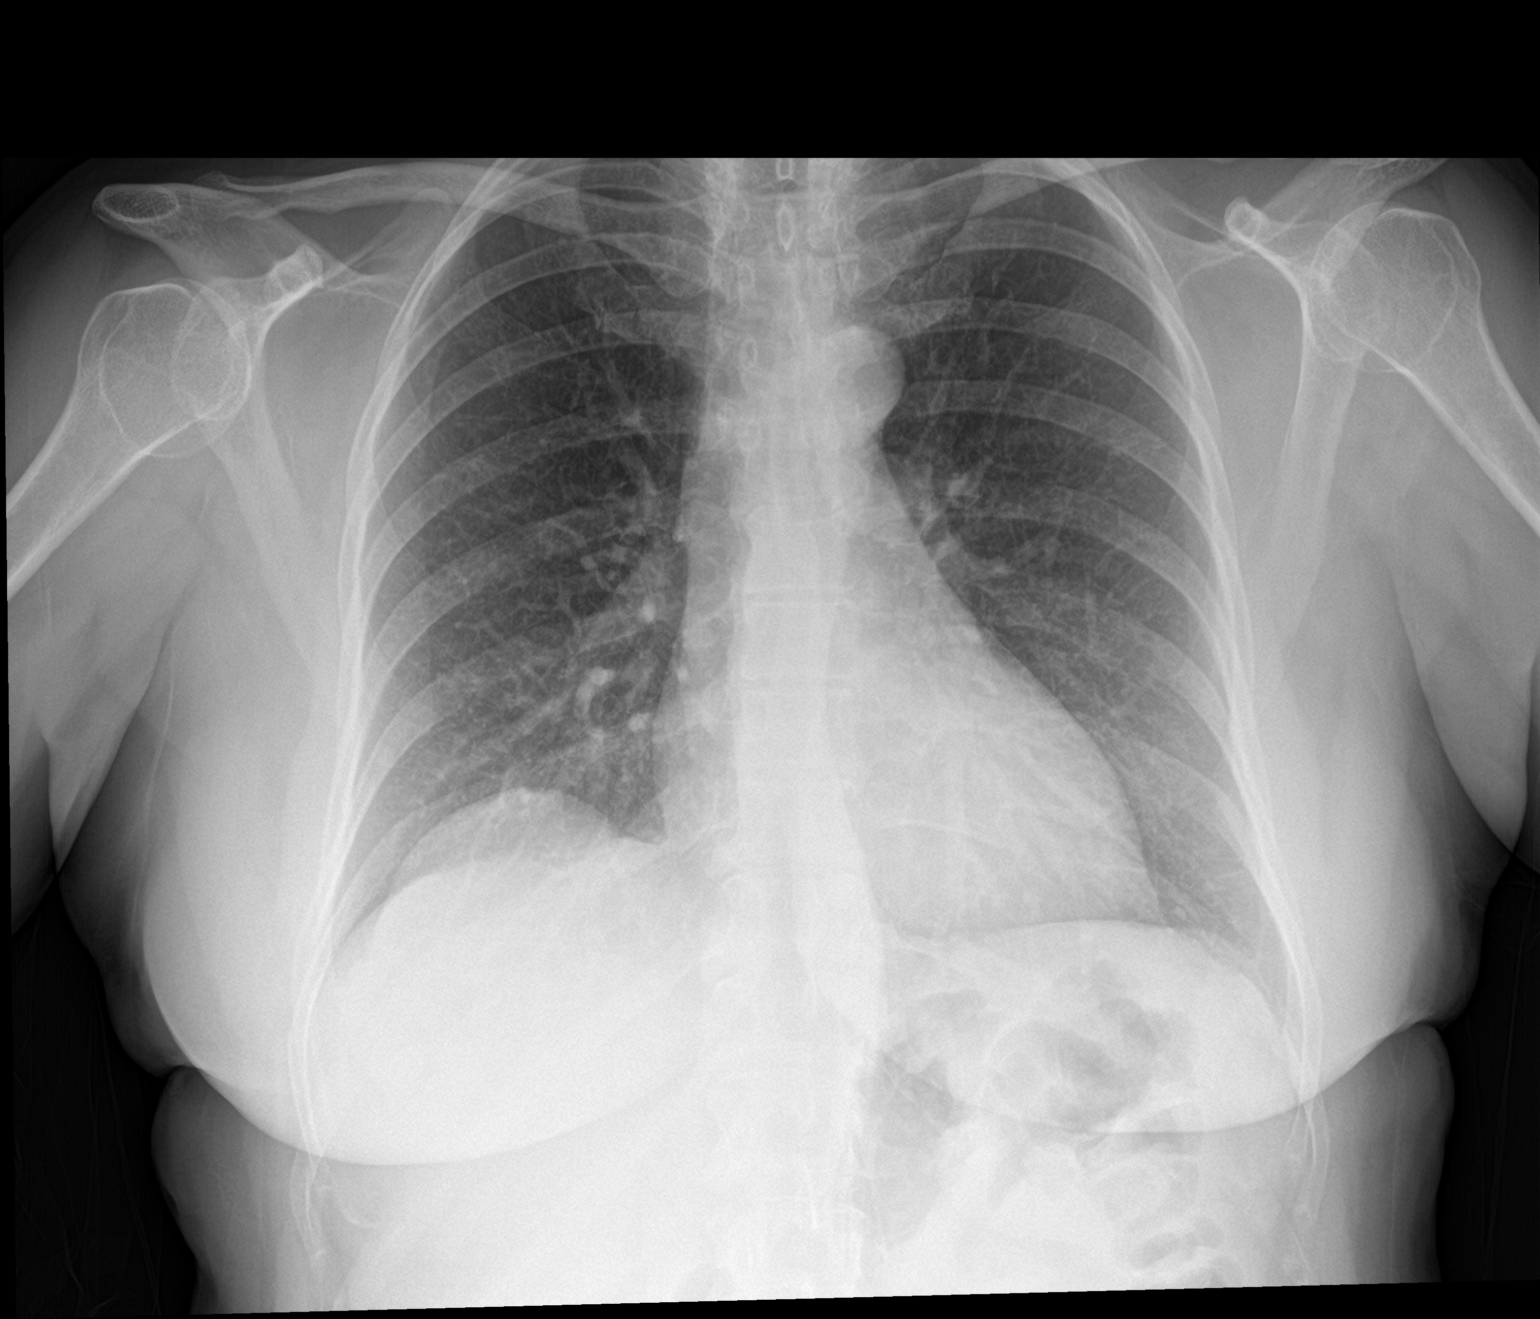

[chest lat]
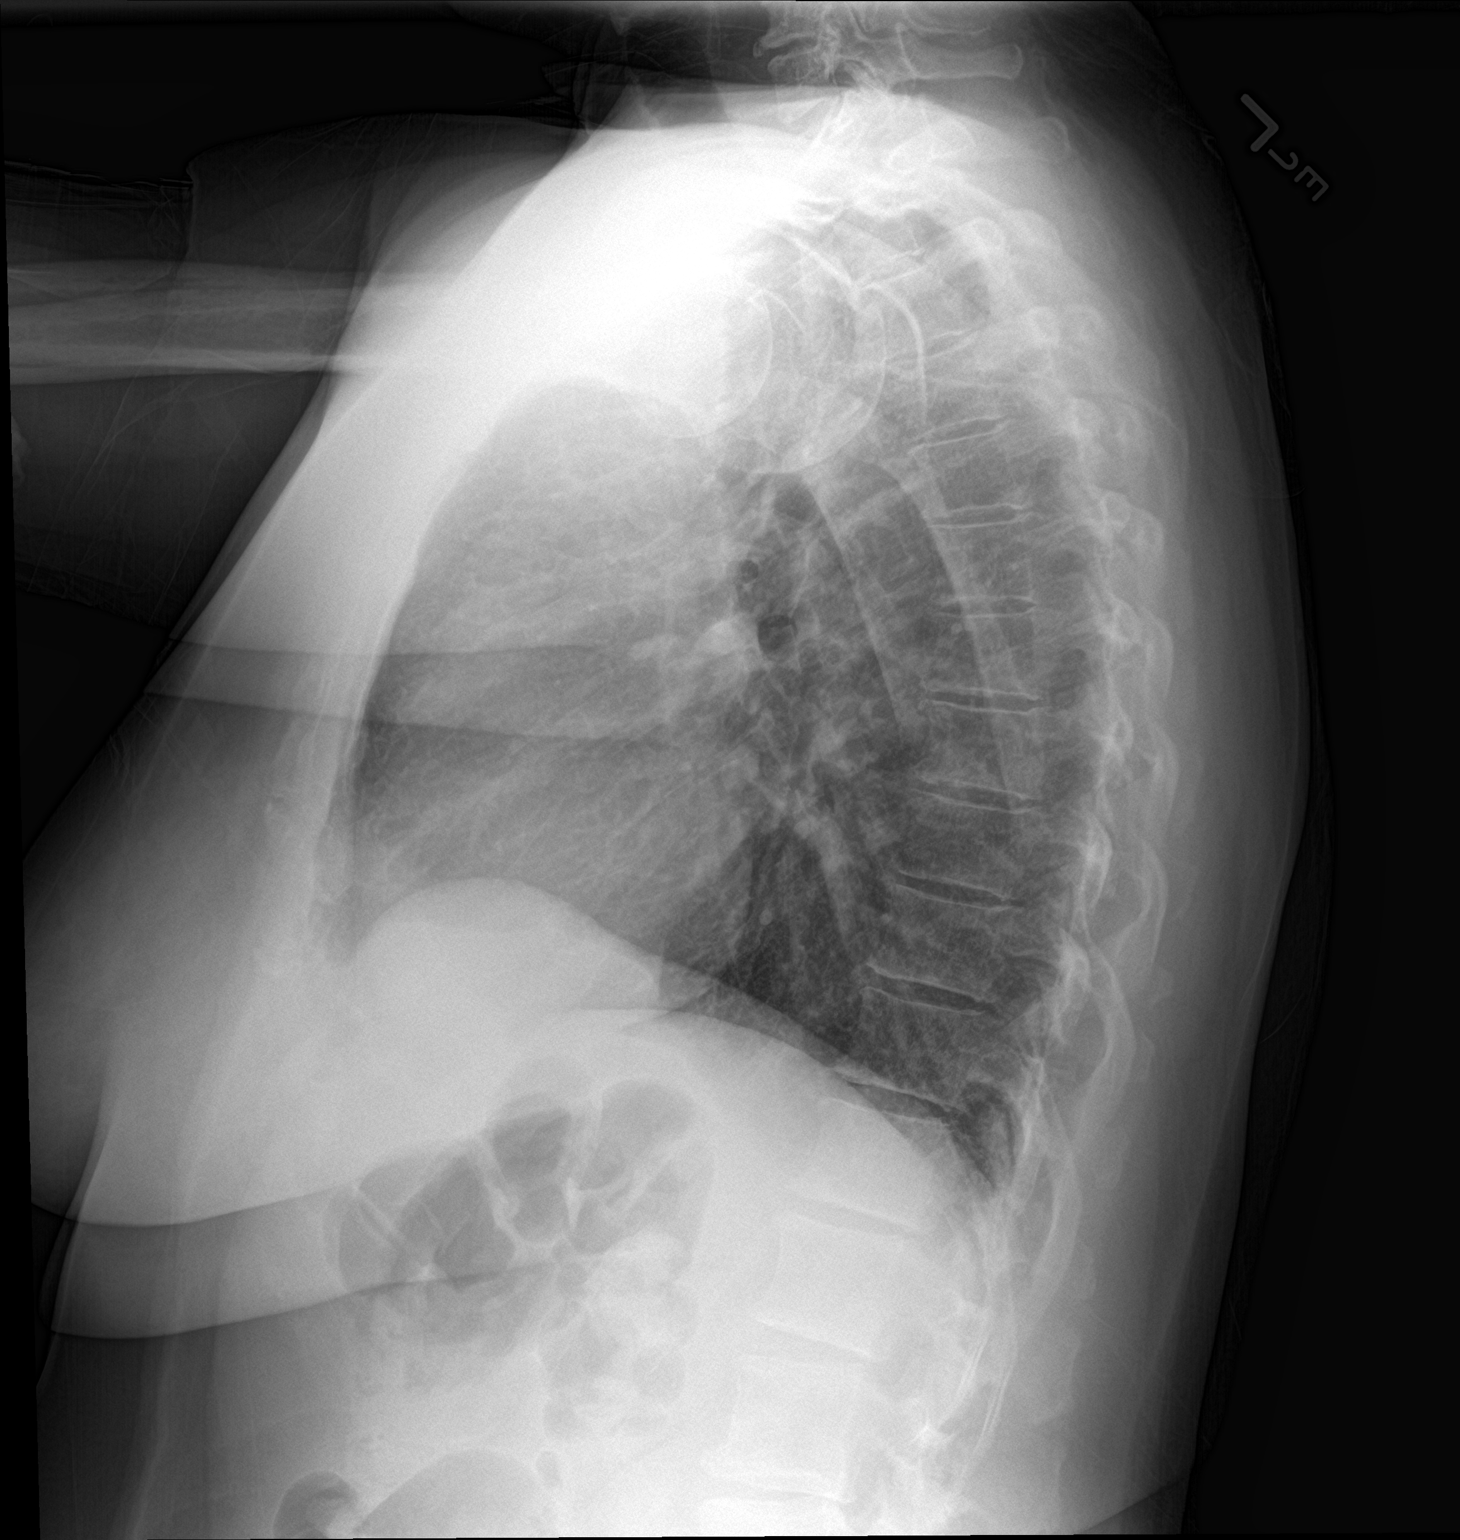

[2 of 2 positions shown; findings below may reference images not displayed]

FINDINGS: The heart size and mediastinal contours are within normal limits.
Both lungs are clear. The visualized skeletal structures are
unremarkable.
IMPRESSION: Negative.

## 2021-07-30 IMAGING — CT CT ABD-PELV W/ CM
2 of 5 series · 16 of 46 positions shown, 18 images · IV contrast (Omnipaque)
Comparison: MRI [DATE], CT [DATE]

CLINICAL DATA: Right lower quadrant pain

EXAM:
CT ABDOMEN AND PELVIS WITH CONTRAST
TECHNIQUE: Multidetector CT imaging of the abdomen and pelvis was performed
using the standard protocol following bolus administration of
intravenous contrast.

[Series 2: axial st · axial · 0.96mm/px · z∈[+772,+1172]mm · 13 of 90 slices shown, 15 images]
[im 5/90  soft-tissue]
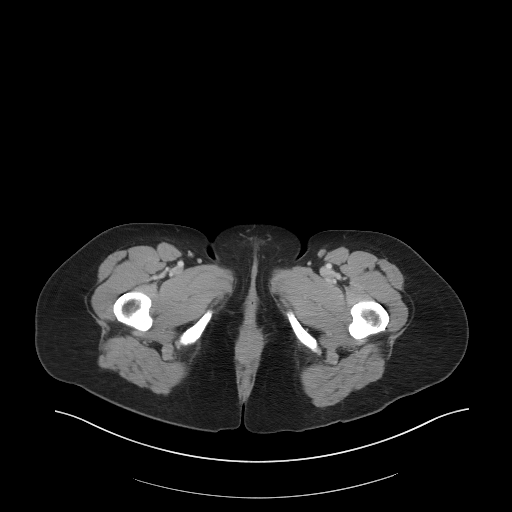
[im 5/90  bone]
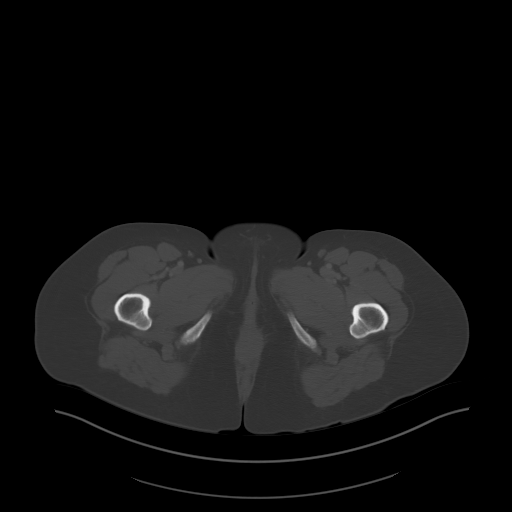
[im 14/90  soft-tissue]
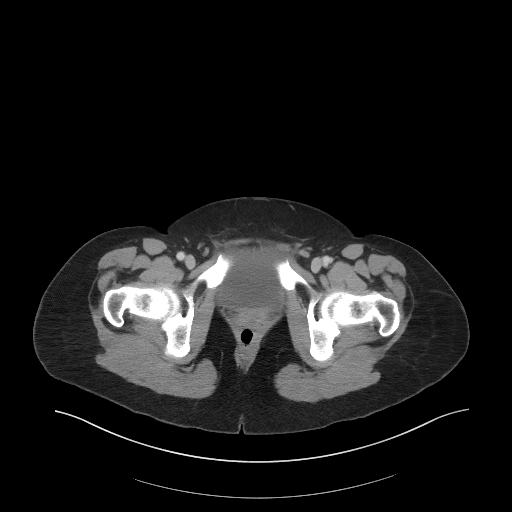
[im 18/90  soft-tissue]
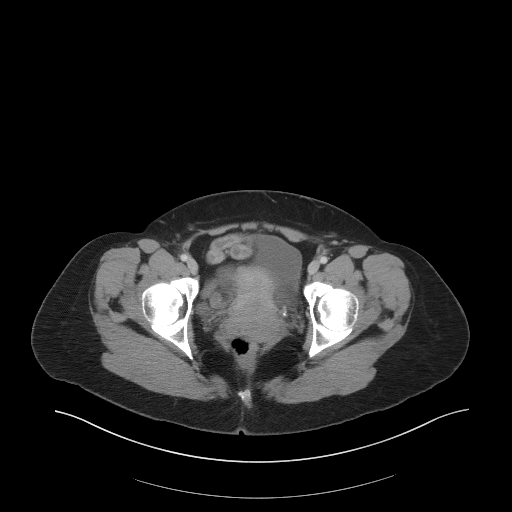
[im 27/90  soft-tissue]
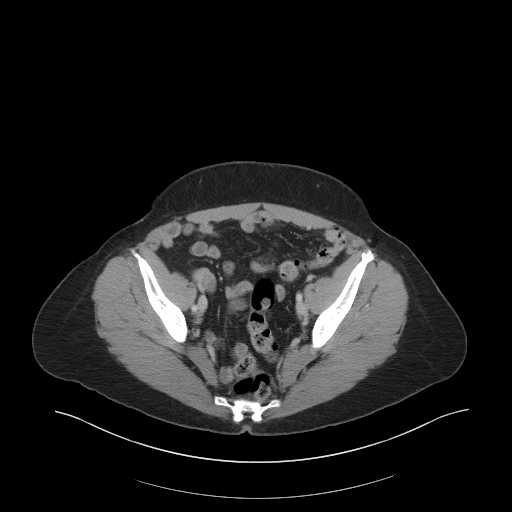
[im 32/90  soft-tissue]
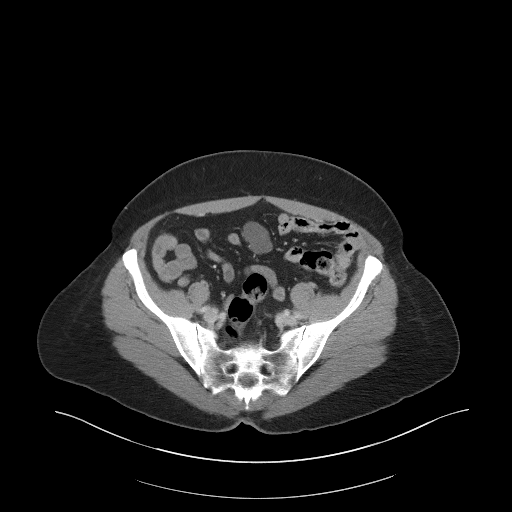
[im 41/90  soft-tissue]
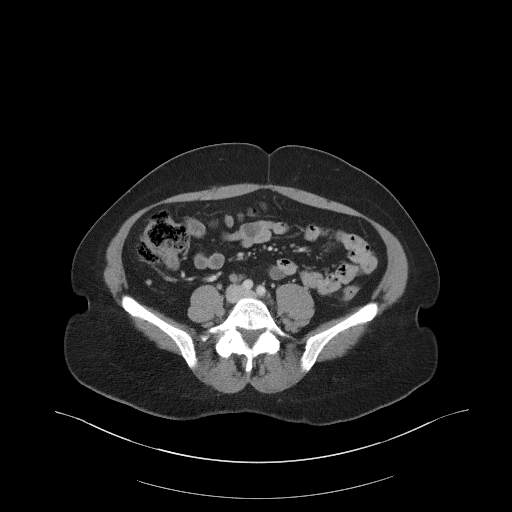
[im 45/90  soft-tissue]
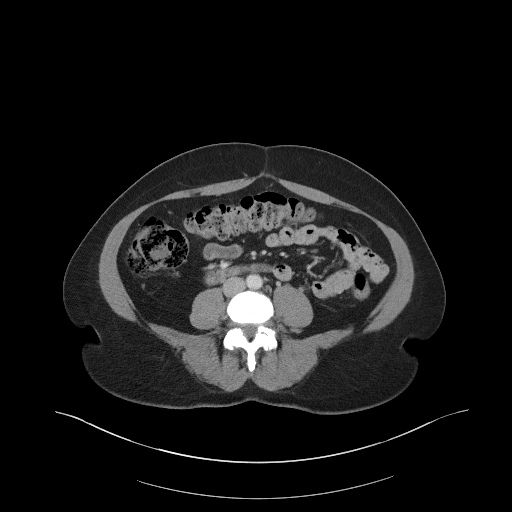
[im 49/90  soft-tissue]
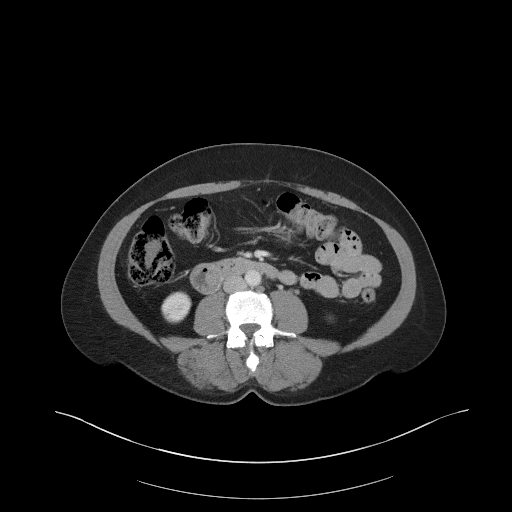
[im 58/90  soft-tissue]
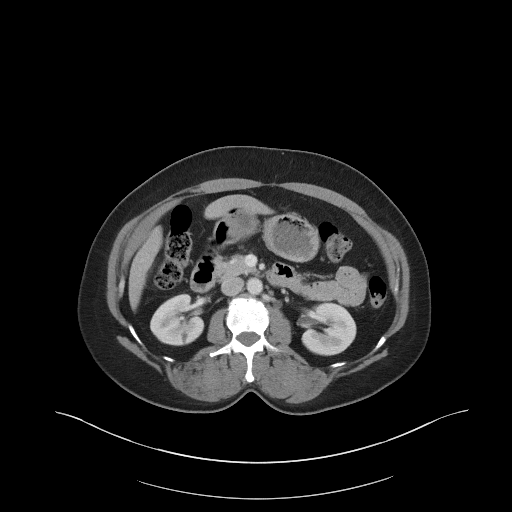
[im 58/90  bone]
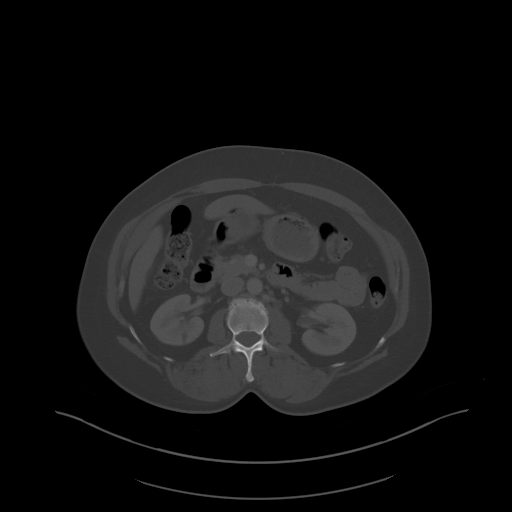
[im 63/90  soft-tissue]
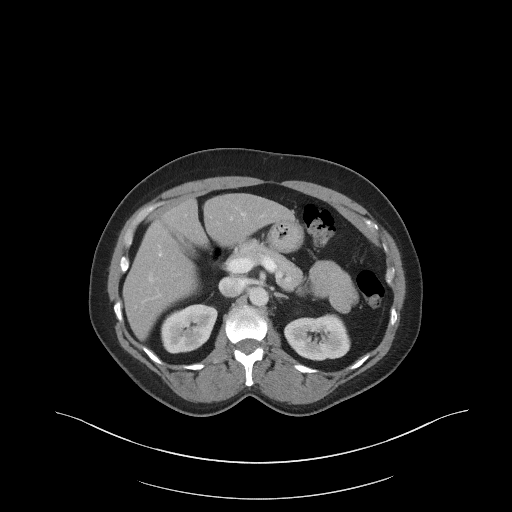
[im 72/90  soft-tissue]
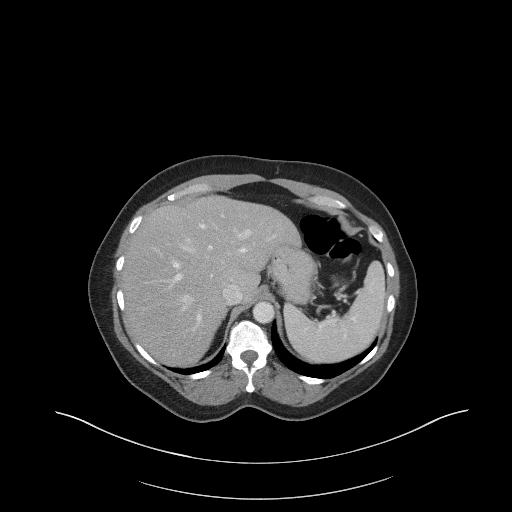
[im 76/90  soft-tissue]
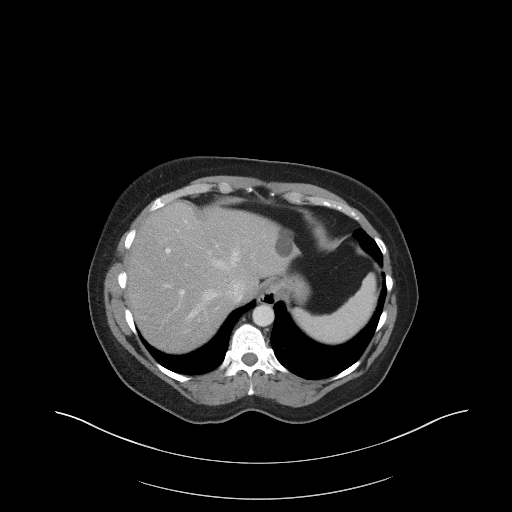
[im 85/90  soft-tissue]
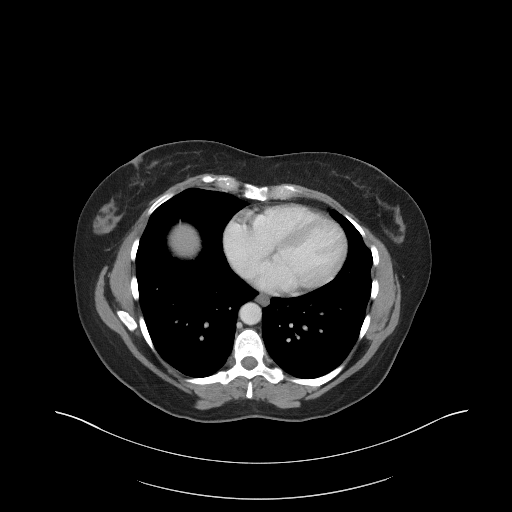

[Series 5: coronal st · coronal · 0.74mm/px · 3 of 101 slices shown]
[im 34/101  soft-tissue]
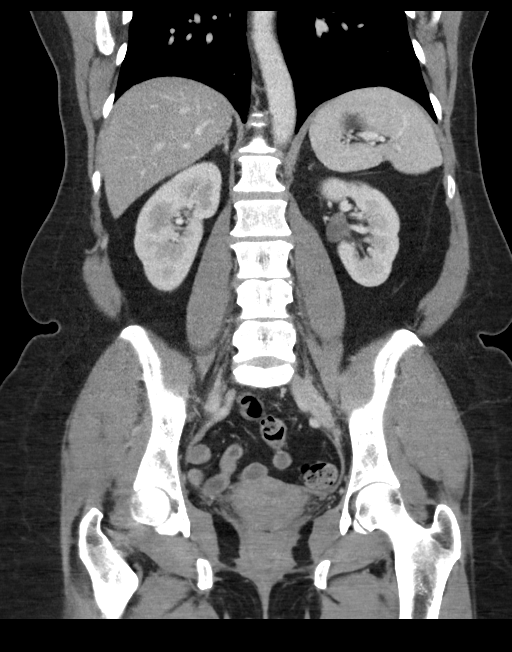
[im 45/101  soft-tissue]
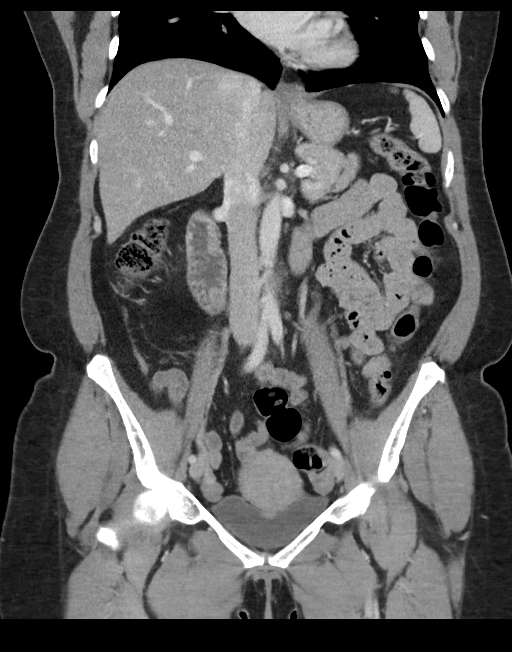
[im 56/101  soft-tissue]
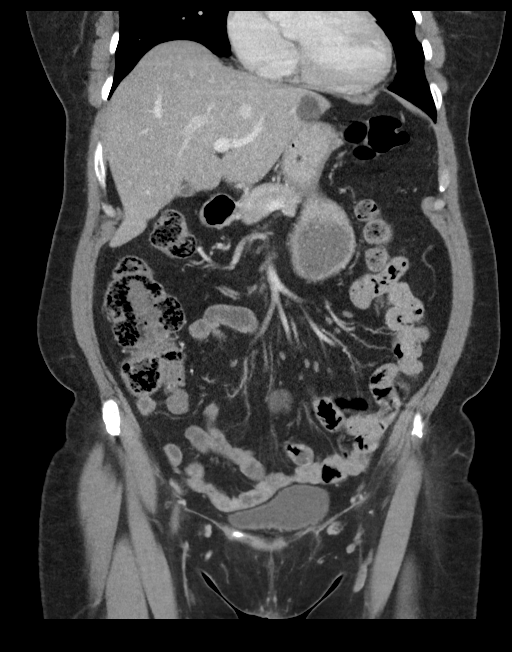

[16 of 46 positions shown; findings below may reference images not displayed]

RADIATION DOSE REDUCTION: This exam was performed according to the
departmental dose-optimization program which includes automated
exposure control, adjustment of the mA and/or kV according to
patient size and/or use of iterative reconstruction technique.

CONTRAST:  100mL OMNIPAQUE IOHEXOL 300 MG/ML  SOLN
FINDINGS: Lower chest: No acute abnormality.

Hepatobiliary: Contracted gallbladder. No calcified stones. Hepatic
cysts.

Pancreas: Unremarkable. No pancreatic ductal dilatation or
surrounding inflammatory changes.

Spleen: Normal in size without focal abnormality.

Adrenals/Urinary Tract: Adrenal glands are unremarkable. Kidneys are
normal, without renal calculi, focal lesion, or hydronephrosis.
Bladder is unremarkable.

Stomach/Bowel: Stomach is within normal limits. Appendix appears
normal. No evidence of bowel wall thickening, distention, or
inflammatory changes.

Vascular/Lymphatic: No significant vascular findings are present. No
enlarged abdominal or pelvic lymph nodes.

Reproductive: Tubal ligation clips.  No adnexal mass

Other: Negative for pelvic effusion or free air. Redemonstrated
water density cystic lesion in the central pelvis measuring 3.3 x
2.5 cm, previously 3.7 cm. No enhancing nodularity.

Musculoskeletal: No acute or significant osseous findings.
IMPRESSION: 1. No CT evidence for acute intra-abdominal or pelvic abnormality.
2. Stable 3.3 cm water density cystic lesion in the central
mesentery suggestive mesenteric cyst.
3. Redemonstrated hepatic cysts.

## 2021-07-30 MED ORDER — IOHEXOL 300 MG/ML  SOLN
100.0000 mL | Freq: Once | INTRAMUSCULAR | Status: AC | PRN
  Administered 2021-07-30: 100 mL via INTRAVENOUS

## 2021-07-30 MED ORDER — SODIUM CHLORIDE 0.9 % IV BOLUS
1000.0000 mL | Freq: Once | INTRAVENOUS | Status: AC
  Administered 2021-07-30: 1000 mL via INTRAVENOUS

## 2021-07-30 NOTE — ED Provider Notes (Signed)
[facility] EMERGENCY DEPARTMENT Provider Note   CSN: [id number] Arrival date & time: 07/30/21  1610     History  Chief Complaint  Patient presents with   Abdominal Pain    Theresa Gonzalez is a 56 y.o. female here presenting with abdominal pain.  Patient has been having ongoing abdominal pain for the last month or so.  Patient already had extensive parasite in 2 days and CT abdomen pelvis and ultrasound and even the MRI yesterday.  Patient was noted to have a small mesenteric cyst.  Patient wants to find out answers and was referred to GI outpatient.  Patient states that she went to a integrated therapy placed today.  She states that the therapist there pressed on her abdomen and she was very focal tender in the right lower quadrant so she was sent to the ED for rule out appendicitis.  They also was concerned that she may have pneumonia given that she has diminished breath sounds on the right lung base.  However patient has no cough or fever.   The history is provided by the patient.      Home Medications Prior to Admission medications   Medication Sig Start Date End Date Taking? Authorizing Provider  albuterol (VENTOLIN HFA) 108 (90 Base) MCG/ACT inhaler Inhale into the lungs. 08/20/20   [provider]  EPINEPHrine 0.3 mg/0.3 mL IJ SOAJ injection Inject into the muscle. 08/20/20   [provider]  famotidine (PEPCID) 20 MG tablet Take one tablet twice a day to control reflux 02/26/21   Ambs, Norvel Richards, FNP  fenofibrate (TRICOR) 145 MG tablet Take 1 tablet (145 mg total) by mouth daily. 12/19/20   Cirigliano, Mary K, DO  fluticasone (FLONASE) 50 MCG/ACT nasal spray 2 sprays per nostril once a day as needed for stuffy nose. 02/26/21   Hetty Blend, FNP  levocetirizine (XYZAL) 5 MG tablet Take 1 tablet (5 mg total) by mouth every evening. 01/12/21   Alfonse Spruce, MD  methocarbamol (ROBAXIN) 500 MG tablet Take 1 tablet (500 mg total) by mouth 2 (two) times daily  as needed for muscle spasms. 07/24/21   Terrilee Files, MD  ondansetron (ZOFRAN) 4 MG tablet Take 1 tablet (4 mg total) by mouth every 8 (eight) hours as needed for nausea or vomiting. 05/17/21   [name], PA-C  pramipexole (MIRAPEX) 1 MG tablet Take 1 tablet (1 mg total) by mouth at bedtime. 11/28/20   Cirigliano, Jearld Lesch, DO      Allergies    Penicillins, Potassium citrate, Ropinirole, Cetirizine hcl, Nabumetone, Nsaids, and Sodium hyaluronate    Review of Systems   Review of Systems  Gastrointestinal:  Positive for abdominal pain.  All other systems reviewed and are negative.  Physical Exam Updated Vital Signs BP (!) 145/90 (BP Location: Left Arm)    Pulse 72    Temp 98.1 F (36.7 C) (Oral)    Resp 16    Ht 5\' 3"  (1.6 m)    Wt 74.4 kg    SpO2 96%    BMI 29.05 kg/m  Physical Exam Vitals and nursing note reviewed.  Constitutional:      Comments: Slightly anxious  HENT:     Head: Normocephalic.     Mouth/Throat:     Mouth: Mucous membranes are moist.  Eyes:     Extraocular Movements: Extraocular movements intact.     Pupils: Pupils are equal, round, and reactive to light.  Cardiovascular:  Rate and Rhythm: Normal rate and regular rhythm.     Heart sounds: Normal heart sounds.  Pulmonary:     Effort: Pulmonary effort is normal.     Comments: Diminished breath sounds bilateral bases but no wheezing or crackles Abdominal:     General: Abdomen is flat.     Palpations: Abdomen is soft.     Comments: Mild right lower quadrant tenderness.  No rebound or guarding.  Skin:    General: Skin is warm.     Capillary Refill: Capillary refill takes less than 2 seconds.  Neurological:     General: No focal deficit present.     Mental Status: She is alert and oriented to person, place, and time.  Psychiatric:        Mood and Affect: Mood normal.        Behavior: Behavior normal.    ED Results / Procedures / Treatments   Labs (all labs ordered are listed, but only  abnormal results are displayed) Labs Reviewed  LIPASE, BLOOD  COMPREHENSIVE METABOLIC PANEL  CBC  URINALYSIS, ROUTINE W REFLEX MICROSCOPIC    EKG None  Radiology No results found.  Procedures Procedures    Medications Ordered in ED Medications  sodium chloride 0.9 % bolus 1,000 mL (1,000 mLs Intravenous New Bag/Given 07/30/21 1754)  iohexol (OMNIPAQUE) 300 MG/ML solution 100 mL (100 mLs Intravenous Contrast Given 07/30/21 1812)    ED Course/ Medical Decision Making/ A&P                           Medical Decision Making Theresa Gonzalez is a 56 y.o. female here presenting with right lower quadrant pain and diminished breath sounds.  Patient has been having abdominal pain for the last month or so and had extensive work-up.  However she was noted to have McBurney's point tenderness today.  She is mildly tender in the right lower quadrant.  She has no peritoneal signs.  We will get CT abdomen pelvis to rule out appendicitis.  We will also get CBC and CMP as well.  Outpatient doctor was concerned for possible pneumonia given lung exam.  Patient is afebrile has no cough.  We will get a chest x-ray to further confirm  7:14 PM CBC and CMP and urinalysis reviewed and were unremarkable.  CT abdomen pelvis showed liver cysts and no appendicitis.  Chest x-ray did not show any pneumonia.  At this point, patient is stable for discharge.  She has been already referred to GI doctor outpatient   Problems Addressed: Mesenteric cyst: chronic illness or injury Right lower quadrant abdominal pain: acute illness or injury  Amount and/or Complexity of Data Reviewed Independent Historian: spouse External Data Reviewed: labs, radiology and notes.    Details: recent CT ab/pel, MRI abdomen, and RUQ [place] Labs: ordered. Decision-making details documented in ED Course. Radiology: ordered and independent interpretation performed. Decision-making details documented in ED Course.  Risk Prescription drug  management.   Final Clinical Impression(s) / ED Diagnoses Final diagnoses:  None    Rx / DC Orders ED Discharge Orders     None         Charlynne Pander, MD 07/30/21 [phone]

## 2021-07-30 NOTE — Progress Notes (Signed)
Component Ref Range & Units 6 d ago 4 wk ago  Lipase 11 - 51 U/L 32  32.0 R    CBC Latest Ref Rng & Units 07/24/2021 07/02/2021 06/26/2021  WBC 4.0 - 10.5 K/uL 4.3 4.5 4.3  Hemoglobin 12.0 - 15.0 g/dL 13.9 13.5 13.1  Hematocrit 36.0 - 46.0 % 40.0 40.4 38.9  Platelets 150 - 400 K/uL 243 279.0 244.0   CMP Latest Ref Rng & Units 07/24/2021 07/02/2021 01/12/2021  Glucose 70 - 99 mg/dL 110(H) 97 123(H)  BUN 6 - 20 mg/dL 13 16 7   Creatinine 0.44 - 1.00 mg/dL 0.88 1.04 0.87  Sodium 135 - 145 mmol/L 141 141 146(H)  Potassium 3.5 - 5.1 mmol/L 3.9 4.1 3.7  Chloride 98 - 111 mmol/L 108 104 100  CO2 22 - 32 mmol/L 24 30 24   Calcium 8.9 - 10.3 mg/dL 9.5 9.7 9.6  Total Protein 6.5 - 8.1 g/dL 7.1 6.7 6.3  Total Bilirubin 0.3 - 1.2 mg/dL 0.7 0.6 0.3  Alkaline Phos 38 - 126 U/L 60 59 76  AST 15 - 41 U/L 23 19 16   ALT 0 - 44 U/L 26 25 18    Component Ref Range & Units 6 d ago 1 mo ago  Color, Urine YELLOW STRAW Abnormal     APPearance CLEAR CLEAR   R   Specific Gravity, Urine 1.005 - 1.030 1.015    pH 5.0 - 8.0 7.0    Glucose, UA NEGATIVE mg/dL NEGATIVE    Hgb urine dipstick NEGATIVE TRACE Abnormal     Bilirubin Urine NEGATIVE NEGATIVE    Ketones, ur NEGATIVE mg/dL NEGATIVE    Protein, ur NEGATIVE mg/dL NEGATIVE    Nitrite NEGATIVE NEGATIVE    Leukocytes,Ua NEGATIVE NEGATIVE     Component Ref Range & Units 6 d ago  RBC / HPF 0 - 5 RBC/hpf 0-5   WBC, UA 0 - 5 WBC/hpf 0-5   Bacteria, UA NONE SEEN RARE Abnormal    Squamous Epithelial / LPF 0 - 5 0-5    Imaging: CT Abdomen and Pelvis wo contrast (07/08/2021) IMPRESSION: 3.7 cm in diameter entity within the central mesentery. Fairly low internal density at 12-15 Hounsfield units. I would suggest an MRI with and without contrast to see if this represents a benign mesenteric cyst. If this is solid, biopsy or PET scan may be necessary.   Two round low-density areas within the left lobe of the liver quite likely to represent adjacent benign liver  cysts. Third smaller focus within the central liver.   No other regional finding to explain the clinical presentation.   Ultrasound Abdomen (07/24/2021) IMPRESSION: 1. No etiology is seen for the patient's abdominal pain. 2. Benign appearing cystic lesions in the left hepatic lobe, which may represent 1 septated cyst or 2 adjacent cysts. These correlate with the low-density lesion seen on the 07/08/2021 CT.  MR Abdomen w wo contrast (07/28/2021) IMPRESSION: 1. A lesion of the central small bowel mesentery in the superior pelvis seen on prior CT is only partially included on this examination of the abdomen, seen in coronal series only. On the included sequences, this appears to be a thinly septated cyst without associated contrast enhancement or solid component. This most likely reflects a benign mesenteric cyst.  2. Multiple small, benign simple cysts of the liver, for which no further follow-up or characterization is required. 3. Hepatic steatosis. 4. Small hiatal hernia.  1. Abdominal pain, unspecified abdominal location Work-up for abdominal pain has been [employer]  so far. I will refer to gastroenterology for assessment.  - Ambulatory referral to Gastroenterology  Haydee Salter, MD

## 2021-07-30 NOTE — ED Triage Notes (Signed)
Pt states she was seen today by medical office in [place] for "my hormones" -pt had RUQ pain with palpation-pt states she has been having back pain since 06/26/21-states she has been seen for back pain-had CT scan and MRI done-NAD-steady gait

## 2021-07-30 NOTE — Discharge Instructions (Signed)
Your CT scan showed normal appendix.  You do have a mesenteric cyst that was there on the MRI yesterday  You should talk to your primary care doctor about referral to a GI specialist for further evaluation of your abdominal pain  Return to ER if you have worse abdominal pain, vomiting, fever

## 2021-07-31 ENCOUNTER — Encounter: Payer: Self-pay | Admitting: Gastroenterology

## 2021-08-06 DIAGNOSIS — H0101A Ulcerative blepharitis right eye, upper and lower eyelids: Secondary | ICD-10-CM | POA: Diagnosis not present

## 2021-08-06 DIAGNOSIS — H0101B Ulcerative blepharitis left eye, upper and lower eyelids: Secondary | ICD-10-CM | POA: Diagnosis not present

## 2021-08-06 DIAGNOSIS — Z83518 Family history of other specified eye disorder: Secondary | ICD-10-CM | POA: Diagnosis not present

## 2021-08-06 DIAGNOSIS — H52223 Regular astigmatism, bilateral: Secondary | ICD-10-CM | POA: Diagnosis not present

## 2021-08-06 DIAGNOSIS — H5213 Myopia, bilateral: Secondary | ICD-10-CM | POA: Diagnosis not present

## 2021-08-06 DIAGNOSIS — H524 Presbyopia: Secondary | ICD-10-CM | POA: Diagnosis not present

## 2021-08-06 DIAGNOSIS — D3132 Benign neoplasm of left choroid: Secondary | ICD-10-CM | POA: Diagnosis not present

## 2021-08-14 ENCOUNTER — Ambulatory Visit (INDEPENDENT_AMBULATORY_CARE_PROVIDER_SITE_OTHER): Payer: BC Managed Care – PPO | Admitting: Gastroenterology

## 2021-08-14 ENCOUNTER — Other Ambulatory Visit: Payer: Self-pay

## 2021-08-14 ENCOUNTER — Encounter: Payer: Self-pay | Admitting: Gastroenterology

## 2021-08-14 VITALS — BP 120/84 | HR 88 | Ht 63.0 in | Wt 167.2 lb

## 2021-08-14 DIAGNOSIS — K668 Other specified disorders of peritoneum: Secondary | ICD-10-CM | POA: Diagnosis not present

## 2021-08-14 DIAGNOSIS — R1011 Right upper quadrant pain: Secondary | ICD-10-CM | POA: Diagnosis not present

## 2021-08-14 DIAGNOSIS — M940 Chondrocostal junction syndrome [Tietze]: Secondary | ICD-10-CM

## 2021-08-14 NOTE — Patient Instructions (Signed)
If you are age 57 or older, your body mass index should be between 23-30. Your Body mass index is 29.63 kg/m. If this is out of the aforementioned range listed, please consider follow up with your Primary Care Provider.  If you are age 34 or younger, your body mass index should be between 19-25. Your Body mass index is 29.63 kg/m. If this is out of the aformentioned range listed, please consider follow up with your Primary Care Provider.   __________________________________________________________  The [facility] providers would like to encourage you to use [facility] to communicate with providers for non-urgent requests or questions.  Due to long hold times on the telephone, sending your provider a message by [facility] may be a faster and more efficient way to get a response.  Please allow 48 business hours for a response.  Please remember that this is for non-urgent requests.   We will send you a letter to remind you to schedule your MRI of the abdomen and pelvis in 6 months.  We have sent the following medications to your pharmacy for you to pick up at your convenience:  Prilosec 20 mg increase to 20 mg tablets twice a day for 2 weeks  Take 400 mg of motrin ( ibuprofen) twice daily for 2 weeks  Thank you for choosing me and [facility].  Vito Cirigliano, D.O.

## 2021-08-14 NOTE — Progress Notes (Signed)
Chief Complaint: Abdominal pain, nausea   Referring Provider:     Haydee Salter, MD   HPI:     Theresa Gonzalez is a 56 y.o. female referred to the Gastroenterology Clinic for evaluation of abdominal pain.    Pain started 1 week prior to Christmas with associated n/v x1 week. RUQ and right flank pain.  Did have a kidney stone in 20202 with similar sxs, but imaging negative for nephrolithiasis.   -07/08/2021: CT AP: 3.7 cm density in central mesentery.  Recommend MRI with/without contrast and if solid, plan for biopsy or PET scan.  Benign liver cysts. - 07/24/2021: Abdominal ultrasound: Benign left hepatic cysts - 07/28/2021: MRI abdomen: Lesion of the central small bowel mesentery only partially included on this examination of the abdomen.  Appears to be thinly septated cyst without associated contrast enhancement or solid component.  Most likely reflects benign mesenteric cyst.  Multiple benign liver cysts, hepatic steatosis, small hiatal hernia - 07/30/2021: Was seen at [facility] and sent to ER for evaluation of right-sided abdominal pain - 07/30/2021: ER evaluation for right-sided abdominal pain.  HD stable.  Normal CBC, CMP, lipase.  Normal CXR - 07/30/2021: CT AP: Hepatic cysts without duct dilation.  Water density cystic lesion in central pelvis measuring 3.3 x 2.5 cm without enhancing nodularity, c/w mesenteric cyst.  Today, she reports still having RUQ/right flank pain, although decreased in intensity.  Described as a dull ache and no longer sharp pains. Not related to PO intake. No clear preceding injury. Still with occasional AM nausea.  Otherwise good p.o. intake, weight stable.  No changes in bowel habits.   Separately, was seen in the Allergy Clinic in 02/2021 and diagnosed with chronic rhinitis and post nasal drip with possible reflux and started on Pepcid 20 mg twice daily and Prilosec 20 mg/day with improvement.  She otherwise denies HB, regurgitation,  dysphagia.    Endoscopic history: - Colonoscopy (12/2018): No report available for review, but normal per patient  CBC Latest Ref Rng & Units 07/30/2021 07/24/2021 07/02/2021  WBC 4.0 - 10.5 K/uL 5.8 4.3 4.5  Hemoglobin 12.0 - 15.0 g/dL 13.5 13.9 13.5  Hematocrit 36.0 - 46.0 % 39.2 40.0 40.4  Platelets 150 - 400 K/uL 274 243 279.0   CMP Latest Ref Rng & Units 07/30/2021 07/24/2021 07/02/2021  Glucose 70 - 99 mg/dL 85 110(H) 97  BUN 6 - 20 mg/dL 16 13 16   Creatinine 0.44 - 1.00 mg/dL 0.83 0.88 1.04  Sodium 135 - 145 mmol/L 141 141 141  Potassium 3.5 - 5.1 mmol/L 3.9 3.9 4.1  Chloride 98 - 111 mmol/L 105 108 104  CO2 22 - 32 mmol/L 27 24 30   Calcium 8.9 - 10.3 mg/dL 9.7 9.5 9.7  Total Protein 6.5 - 8.1 g/dL 7.2 7.1 6.7  Total Bilirubin 0.3 - 1.2 mg/dL 0.4 0.7 0.6  Alkaline Phos 38 - 126 U/L 66 60 59  AST 15 - 41 U/L 25 23 19   ALT 0 - 44 U/L 27 26 25      Past Medical History:  Diagnosis Date   Angio-edema    Asthma    Recurrent upper respiratory infection (URI)    Urticaria      Past Surgical History:  Procedure Laterality Date   HERNIA REPAIR Bilateral    knee replacement Left 06/2019   LAPAROSCOPIC LYSIS OF ADHESIONS     TUBAL LIGATION  Family History  Problem Relation Age of Onset   Hypertension Mother    Hyperlipidemia Mother    Retinitis pigmentosa Mother    Heart disease Father    Diabetes Father    Prostate cancer Father    Cancer - Prostate Father    Diabetes Brother    Heart attack Maternal Grandmother    Stroke Maternal Grandfather    Heart attack Maternal Grandfather    Heart disease Paternal Grandmother    Epilepsy Paternal Grandmother    Diabetes Paternal Grandfather    Retinitis pigmentosa Maternal Aunt    Allergic rhinitis Neg Hx    Asthma Neg Hx    Eczema Neg Hx    Urticaria Neg Hx    Colon cancer Neg Hx    Rectal cancer Neg Hx    Stomach cancer Neg Hx    Pancreatic cancer Neg Hx    Liver cancer Neg Hx    Social History   Tobacco  Use   Smoking status: Never   Smokeless tobacco: Never  Vaping Use   Vaping Use: Never used  Substance Use Topics   Alcohol use: Yes    Comment: socially   Drug use: Never   Current Outpatient Medications  Medication Sig Dispense Refill   albuterol (VENTOLIN HFA) 108 (90 Base) MCG/ACT inhaler Inhale into the lungs.     EPINEPHrine 0.3 mg/0.3 mL IJ SOAJ injection Inject into the muscle.     famotidine (PEPCID) 20 MG tablet Take one tablet twice a day to control reflux 180 tablet 1   fenofibrate (TRICOR) 145 MG tablet Take 1 tablet (145 mg total) by mouth daily. 90 tablet 3   fluticasone (FLONASE) 50 MCG/ACT nasal spray 2 sprays per nostril once a day as needed for stuffy nose. 16 g 5   levocetirizine (XYZAL) 5 MG tablet Take 1 tablet (5 mg total) by mouth every evening. 30 tablet 3   methocarbamol (ROBAXIN) 500 MG tablet Take 1 tablet (500 mg total) by mouth 2 (two) times daily as needed for muscle spasms. 20 tablet 0   omeprazole (PRILOSEC) 20 MG capsule Take 20 mg by mouth daily.     ondansetron (ZOFRAN) 4 MG tablet Take 1 tablet (4 mg total) by mouth every 8 (eight) hours as needed for nausea or vomiting. 20 tablet 0   pramipexole (MIRAPEX) 1 MG tablet Take 1 tablet (1 mg total) by mouth at bedtime. 90 tablet 3   No current facility-administered medications for this visit.   Allergies  Allergen Reactions   Penicillins Rash and Shortness Of Breath   Potassium Citrate Itching   Ropinirole Shortness Of Breath   Cetirizine Hcl    Nabumetone Rash   Nsaids Rash    Provider: Mallory Shirk CFM - Allergy Description: NSAIDs CFM - Allergy Annotation: rash   Sodium Hyaluronate Hives     Review of Systems: All systems reviewed and negative except where noted in HPI.     Physical Exam:    Wt Readings from Last 3 Encounters:  08/14/21 167 lb 4 oz (75.9 kg)  07/30/21 164 lb (74.4 kg)  07/24/21 160 lb (72.6 kg)    BP 120/84    Pulse 88    Ht 5\' 3"  (1.6 m)    Wt 167 lb 4 oz (75.9  kg)    SpO2 98%    BMI 29.63 kg/m  Constitutional:  Pleasant, in no acute distress. Psychiatric: Normal mood and affect. Behavior is normal. Cardiovascular: Normal rate, regular rhythm. No  edema Pulmonary/chest: Effort normal and breath sounds normal. No wheezing, rales or rhonchi. Abdominal: Minimal TTP in RUQ and subcostal area.  No rebound or guarding.  No peritoneal signs.  Negative Murphy's and McBurney's points.  Soft, nondistended. Bowel sounds active throughout. There are no masses palpable. No hepatomegaly. MSK: Tenderness along tips of 11th and 12th rib along with TTP of inferior ribs and back and right flank Neurological: Alert and oriented to person place and time. Skin: Skin is warm and dry. No rashes noted.   ASSESSMENT AND PLAN;   1) Rib Tip  Syndrome/Costochondritis 2) RUQ/right flank pain Exam c/w MSK etiology for her pain.  Thankfully pain overall improving.  Otherwise not associated with p.o. intake or other GI symptoms. - Trial course of NSAIDs bid scheduled over the next 2 weeks - Warm compress/heating pad over affected area - Increase Prilosec to 20 mg BID while on scheduled NSAIDs, then can reduce to 20 mg/day as previously taking - If no appreciable improvement or symptoms worsen, neck step would be EGD +/- HIDA  3) Mesenteric cyst CT x2 and MRI with mesenteric cyst without solid component.  Based on location and appearance, not contributing to symptomatology above. - Repeat MRI abdomen/pelvis with contrast in 6 months to ensure size/appearance and stability  4) Nausea - Evaluate for improvement with resolution of pain - Tolerating p.o. intake without issue - Okay to use Zofran as prescribed  RTC prn  Lavena Bullion, DO, FACG  08/14/2021, 9:07 AM   Gena Fray, Lillette Boxer, MD

## 2021-08-24 DIAGNOSIS — R109 Unspecified abdominal pain: Secondary | ICD-10-CM | POA: Diagnosis not present

## 2021-08-24 DIAGNOSIS — N951 Menopausal and female climacteric states: Secondary | ICD-10-CM | POA: Diagnosis not present

## 2021-08-24 DIAGNOSIS — T7840XA Allergy, unspecified, initial encounter: Secondary | ICD-10-CM | POA: Diagnosis not present

## 2021-08-24 DIAGNOSIS — G479 Sleep disorder, unspecified: Secondary | ICD-10-CM | POA: Diagnosis not present

## 2021-08-25 ENCOUNTER — Ambulatory Visit: Payer: BC Managed Care – PPO | Admitting: Family Medicine

## 2021-08-25 ENCOUNTER — Other Ambulatory Visit: Payer: Self-pay

## 2021-08-25 ENCOUNTER — Ambulatory Visit: Payer: BC Managed Care – PPO | Admitting: Gastroenterology

## 2021-08-25 VITALS — BP 118/70 | HR 100 | Temp 97.4°F | Ht 63.0 in | Wt 167.6 lb

## 2021-08-25 DIAGNOSIS — Z1231 Encounter for screening mammogram for malignant neoplasm of breast: Secondary | ICD-10-CM | POA: Diagnosis not present

## 2021-08-25 DIAGNOSIS — M722 Plantar fascial fibromatosis: Secondary | ICD-10-CM

## 2021-08-25 NOTE — Patient Instructions (Signed)
Plantar Fasciitis  Plantar fasciitis is a painful foot condition that affects the heel. It occurs when the band of tissue that connects the toes to the heel bone (plantar fascia) becomes irritated. This can happen as the result of exercising too much or doing other repetitive activities (overuse injury). Plantar fasciitis can cause mild irritation to severe pain that makes it difficult to walk or move. The pain is usually worse in the morning after sleeping, or after sitting or lying down for a period of time. Pain may also beworse after long periods of walking or standing. What are the causes? This condition may be caused by: Standing for long periods of time. Wearing shoes that do not have good arch support. Doing activities that put stress on joints (high-impact activities). This includes ballet and exercise that makes your heart beat faster (aerobic exercise), such as running. Being overweight. An abnormal way of walking (gait). Tight muscles in the back of your lower leg (calf). High arches in your feet or flat feet. Starting a new athletic activity. What are the signs or symptoms? The main symptom of this condition is heel pain. Pain may get worse after the following: Taking the first steps after a time of rest, especially in the morning after awakening, or after you have been sitting or lying down for a while. Long periods of standing still. Pain may decrease after 30-45 minutes of activity, such as gentle walking. How is this diagnosed? This condition may be diagnosed based on your medical history, a physical exam, and your symptoms. Your health care provider will check for: A tender area on the bottom of your foot. A high arch in your foot or flat feet. Pain when you move your foot. Difficulty moving your foot. You may have imaging tests to confirm the diagnosis, such as: X-rays. Ultrasound. MRI. How is this treated? Treatment for plantar fasciitis depends on how severe your  condition is. Treatment may include: Rest, ice, pressure (compression), and raising (elevating) the affected foot. This is called RICE therapy. Your health care provider may recommend RICE therapy along with over-the-counter pain medicines to manage your pain. Exercises to stretch your calves and your plantar fascia. A splint that holds your foot in a stretched, upward position while you sleep (night splint). Physical therapy to relieve symptoms and prevent problems in the future. Injections of steroid medicine (cortisone) to relieve pain and inflammation. Stimulating your plantar fascia with electrical impulses (extracorporeal shock wave therapy). This is usually the last treatment option before surgery. Surgery, if other treatments have not worked after 12 months. Follow these instructions at home: Managing pain, stiffness, and swelling  If directed, put ice on the painful area. To do this: Put ice in a plastic bag, or use a frozen bottle of water. Place a towel between your skin and the bag or bottle. Roll the bottom of your foot over the bag or bottle. Do this for 20 minutes, 2-3 times a day. Wear athletic shoes that have air-sole or gel-sole cushions, or try soft shoe inserts that are designed for plantar fasciitis. Elevate your foot above the level of your heart while you are sitting or lying down.  Activity Avoid activities that cause pain. Ask your health care provider what activities are safe for you. Do physical therapy exercises and stretches as told by your health care provider. Try activities and forms of exercise that are easier on your joints (low impact). Examples include swimming, water aerobics, and biking. General instructions Take over-the-counter   and prescription medicines only as told by your health care provider. Wear a night splint while sleeping, if told by your health care provider. Loosen the splint if your toes tingle, become numb, or turn cold and blue. Maintain  a healthy weight, or work with your health care provider to lose weight as needed. Keep all follow-up visits. This is important. Contact a health care provider if you have: Symptoms that do not go away with home treatment. Pain that gets worse. Pain that affects your ability to move or do daily activities. Summary Plantar fasciitis is a painful foot condition that affects the heel. It occurs when the band of tissue that connects the toes to the heel bone (plantar fascia) becomes irritated. Heel pain is the main symptom of this condition. It may get worse after exercising too much or standing still for a long time. Treatment varies, but it usually starts with rest, ice, pressure (compression), and raising (elevating) the affected foot. This is called RICE therapy. Over-the-counter medicines can also be used to manage pain. This information is not intended to replace advice given to you by your health care provider. Make sure you discuss any questions you have with your healthcare provider. Document Revised: 10/08/2019 Document Reviewed: 10/08/2019 Elsevier Patient Education  2022 Elsevier Inc.  

## 2021-08-25 NOTE — Progress Notes (Signed)
[facility] [address] [place] [address] Dept: [phone] Dept Fax: [phone]  Office Visit  Subjective:    Patient ID: Theresa Gonzalez, female    DOB: [birth year], 56 y.o..   MRN: [id number]  Chief Complaint  Patient presents with   Acute Visit    C/o having pain in bottom of RT foot x 1 month. She has been using Ibuprofen, stretches and ice with no relief.     History of Present Illness:  Patient is in today for a complaint of right foot/heel pain for 1 month. Ms. Nicholl does not recall any specific injury. She has a past history of plantar fasciitis and felt that may be the cause. She has started doing stretches and using ice to help with this.  Past Medical History: Patient Active Problem List   Diagnosis Date Noted   Cystic disease of liver 07/08/2021   Mesenteric cyst 07/08/2021   History of kidney stones 07/02/2021   Arthritis of ankle or foot, degenerative, right 03/16/2021   Prediabetes 02/27/2021   Chronic rhinitis 02/26/2021   Gastroesophageal reflux disease 02/26/2021   DUB (dysfunctional uterine bleeding) 11/28/2020   Facet arthropathy, lumbar 11/28/2020   Mild intermittent asthma, uncomplicated 11/28/2020   RLS (restless legs syndrome) 11/28/2020   SK (seborrheic keratosis) 11/28/2020   Vitamin D insufficiency 11/28/2020   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, 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   Mixed hyperlipidemia 02/04/2011   Past Surgical History:  Procedure Laterality Date   HERNIA REPAIR Bilateral    knee replacement Left 06/2019   LAPAROSCOPIC LYSIS OF ADHESIONS     TUBAL LIGATION     Family History  Problem Relation Age of Onset   Hypertension Mother    Hyperlipidemia Mother    Retinitis pigmentosa Mother    Heart disease Father    Diabetes Father    Prostate cancer Father    Cancer - Prostate Father    Diabetes Brother    Heart attack Maternal Grandmother     Stroke Maternal Grandfather    Heart attack Maternal Grandfather    Heart disease Paternal Grandmother    Epilepsy Paternal Grandmother    Diabetes Paternal Grandfather    Retinitis pigmentosa Maternal Aunt    Allergic rhinitis Neg Hx    Asthma Neg Hx    Eczema Neg Hx    Urticaria Neg Hx    Colon cancer Neg Hx    Rectal cancer Neg Hx    Stomach cancer Neg Hx    Pancreatic cancer Neg Hx    Liver cancer Neg Hx    Outpatient Medications Prior to Visit  Medication Sig Dispense Refill   albuterol (VENTOLIN HFA) 108 (90 Base) MCG/ACT inhaler Inhale into the lungs.     EPINEPHrine 0.3 mg/0.3 mL IJ SOAJ injection Inject into the muscle.     famotidine (PEPCID) 20 MG tablet Take one tablet twice a day to control reflux 180 tablet 1   fenofibrate (TRICOR) 145 MG tablet Take 1 tablet (145 mg total) by mouth daily. 90 tablet 3   fluticasone (FLONASE) 50 MCG/ACT nasal spray 2 sprays per nostril once a day as needed for stuffy nose. 16 g 5   levocetirizine (XYZAL) 5 MG tablet Take 1 tablet (5 mg total) by mouth every evening. 30 tablet 3   methocarbamol (ROBAXIN) 500 MG tablet Take 1 tablet (500 mg total) by mouth 2 (two) times daily as needed for muscle spasms. 20 tablet 0  omeprazole (PRILOSEC) 20 MG capsule Take 20 mg by mouth daily.     ondansetron (ZOFRAN) 4 MG tablet Take 1 tablet (4 mg total) by mouth every 8 (eight) hours as needed for nausea or vomiting. 20 tablet 0   pramipexole (MIRAPEX) 1 MG tablet Take 1 tablet (1 mg total) by mouth at bedtime. 90 tablet 3   No facility-administered medications prior to visit.   Allergies  Allergen Reactions   Penicillins Rash and Shortness Of Breath   Potassium Citrate Itching   Ropinirole Shortness Of Breath   Cetirizine Hcl    Nabumetone Rash   Nsaids Rash    Provider: Valere Dross CFM - Allergy Description: NSAIDs CFM - Allergy Annotation: rash   Sodium Hyaluronate Hives     Objective:   Today's Vitals   08/25/21 1101  BP: 118/70   Pulse: 100  Temp: (!) 97.4 F (36.3 C)  TempSrc: Temporal  SpO2: 97%  Weight: 167 lb 9.6 oz (76 kg)  Height: 5\' 3"  (1.6 m)   Body mass index is 29.69 kg/m.   General: Well developed, well nourished. No acute distress. Foot: Pain over the medial aspect of the sole of the foot near the calcaneus. No swellign or bruising noted.   No tenderness over the Achilles tendon. Ankle joint is stable. Psych: Alert and oriented. Normal mood and affect.  Health Maintenance Due  Topic Date Due   Hepatitis C Screening  Never done    Assessment & Plan:   1. Plantar fasciitis of right foot I reviewed home care of plantar fasciitis. I recommend ongoing use of stretches, icing the foot (frozen water bottle technique), and an oral NSAID. If not improving, recommend podiatry referral for consideration or orthotics or steroid injection.  2. Encounter for screening mammogram for malignant neoplasm of breast  - MM DIGITAL SCREENING BILATERAL; Future  , MD

## 2021-09-10 DIAGNOSIS — N951 Menopausal and female climacteric states: Secondary | ICD-10-CM | POA: Diagnosis not present

## 2021-09-10 DIAGNOSIS — E538 Deficiency of other specified B group vitamins: Secondary | ICD-10-CM | POA: Diagnosis not present

## 2021-09-10 DIAGNOSIS — G479 Sleep disorder, unspecified: Secondary | ICD-10-CM | POA: Diagnosis not present

## 2021-09-10 DIAGNOSIS — E559 Vitamin D deficiency, unspecified: Secondary | ICD-10-CM | POA: Diagnosis not present

## 2021-09-15 ENCOUNTER — Encounter: Payer: Self-pay | Admitting: Family Medicine

## 2021-09-17 ENCOUNTER — Other Ambulatory Visit: Payer: Self-pay | Admitting: Family Medicine

## 2021-09-17 DIAGNOSIS — Z1231 Encounter for screening mammogram for malignant neoplasm of breast: Secondary | ICD-10-CM

## 2021-09-29 ENCOUNTER — Ambulatory Visit
Admission: RE | Admit: 2021-09-29 | Discharge: 2021-09-29 | Disposition: A | Discharge status: Home / Self Care | Payer: BC Managed Care – PPO | Source: Ambulatory Visit | Attending: Family Medicine | Admitting: Family Medicine

## 2021-09-29 DIAGNOSIS — Z1231 Encounter for screening mammogram for malignant neoplasm of breast: Secondary | ICD-10-CM

## 2021-09-29 IMAGING — MG MM DIGITAL SCREENING BILAT W/ TOMO AND CAD
8 series · 8 of 24 positions shown · non-contrast
Comparison: Previous exam(s).

CLINICAL DATA: Screening.

EXAM:
DIGITAL SCREENING BILATERAL MAMMOGRAM WITH TOMOSYNTHESIS AND CAD
TECHNIQUE: Bilateral screening digital craniocaudal and mediolateral oblique
mammograms were obtained. Bilateral screening digital breast
tomosynthesis was performed. The images were evaluated with
computer-aided detection.

[R CC synth-2D]
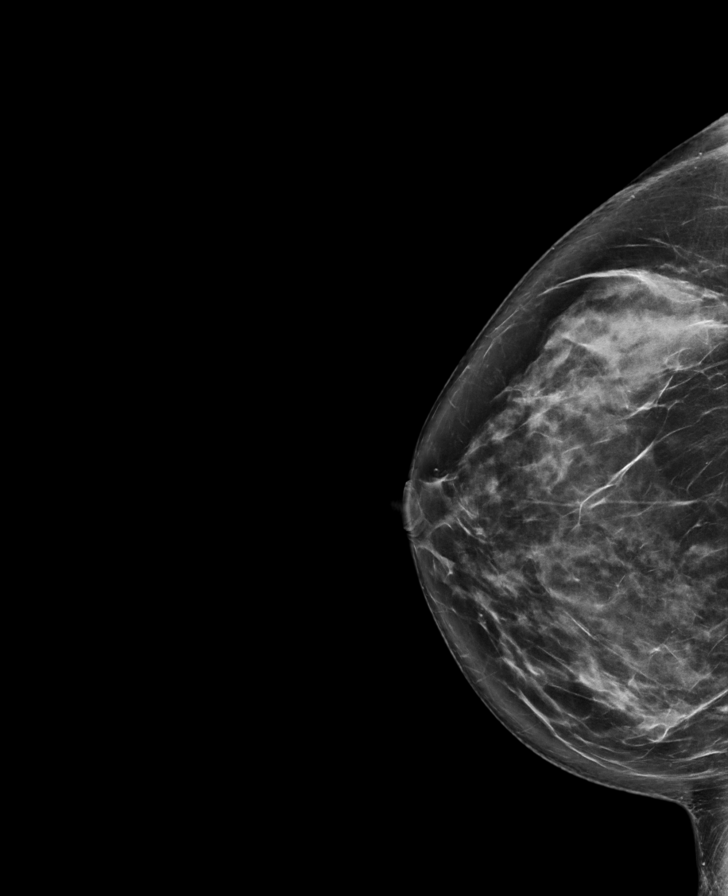

[R MLO synth-2D]
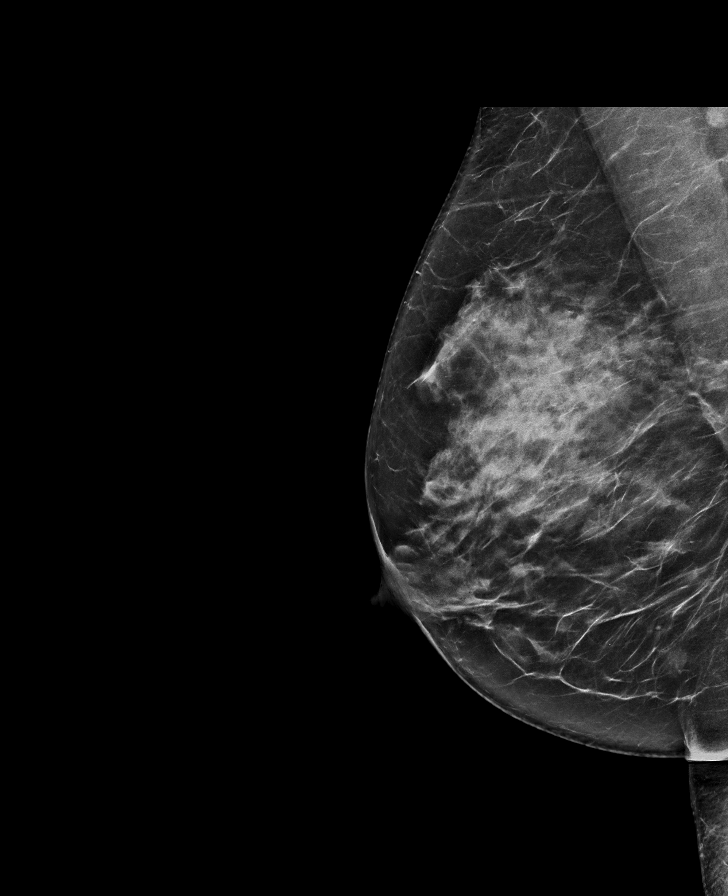

[L MLO synth-2D]
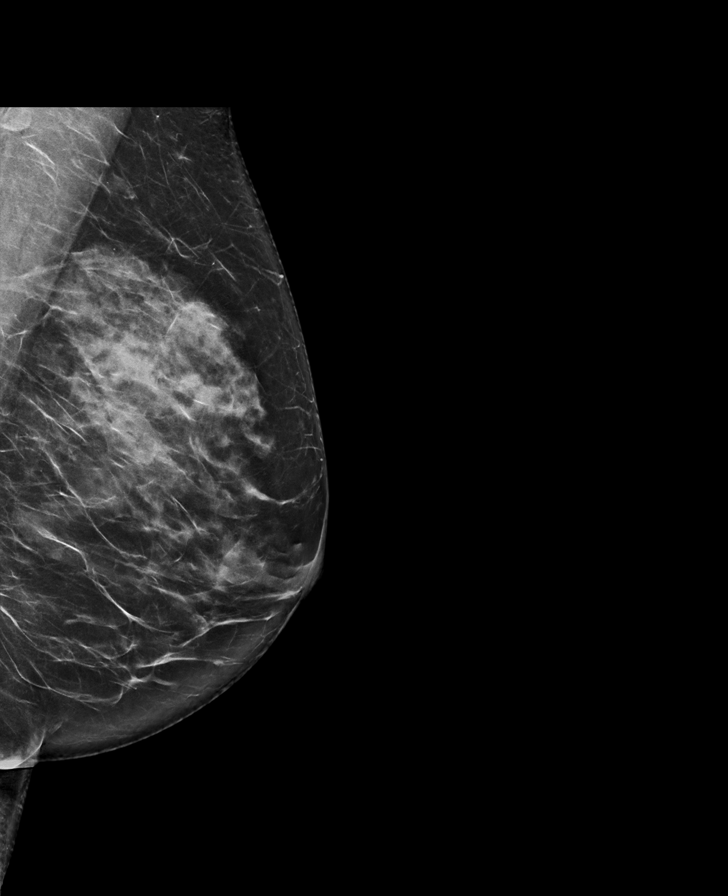

[L CC synth-2D]
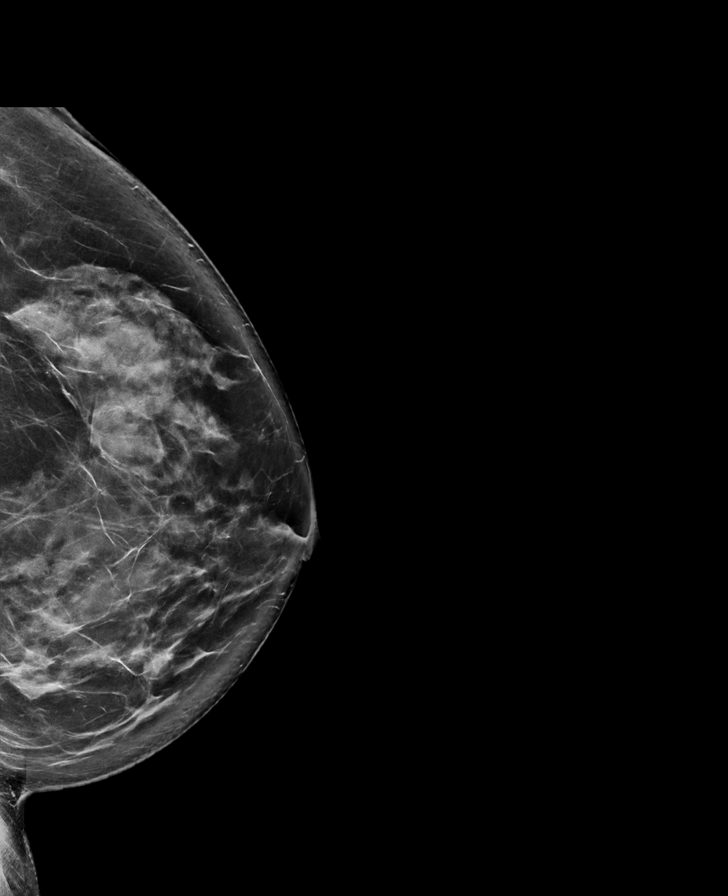

[R MLO tomo · tomo slice 41/81.0]
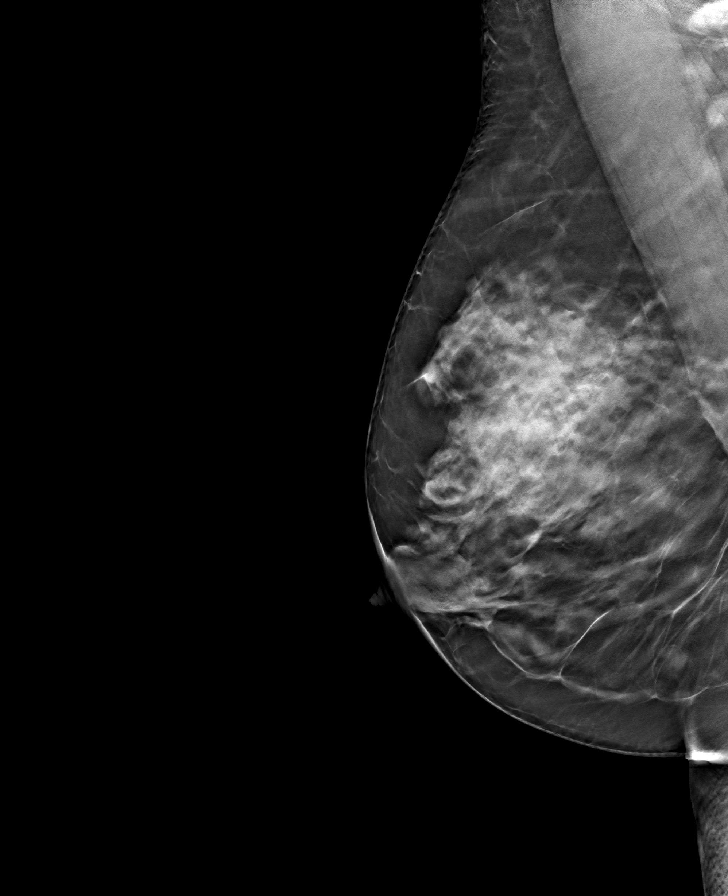

[L MLO tomo · tomo slice 44/87.0]
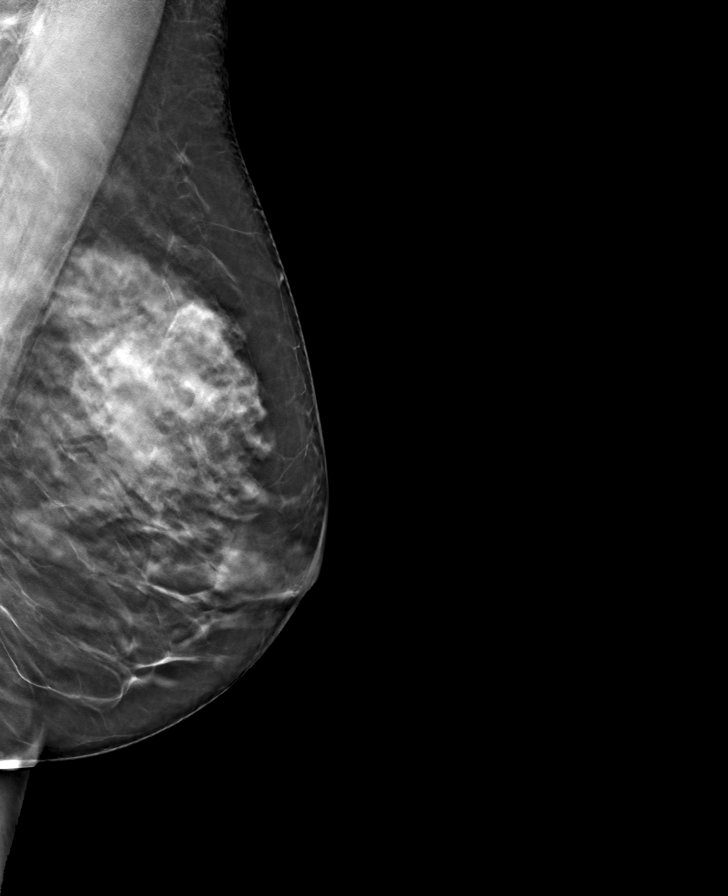

[R CC tomo · tomo slice 41/80.0]
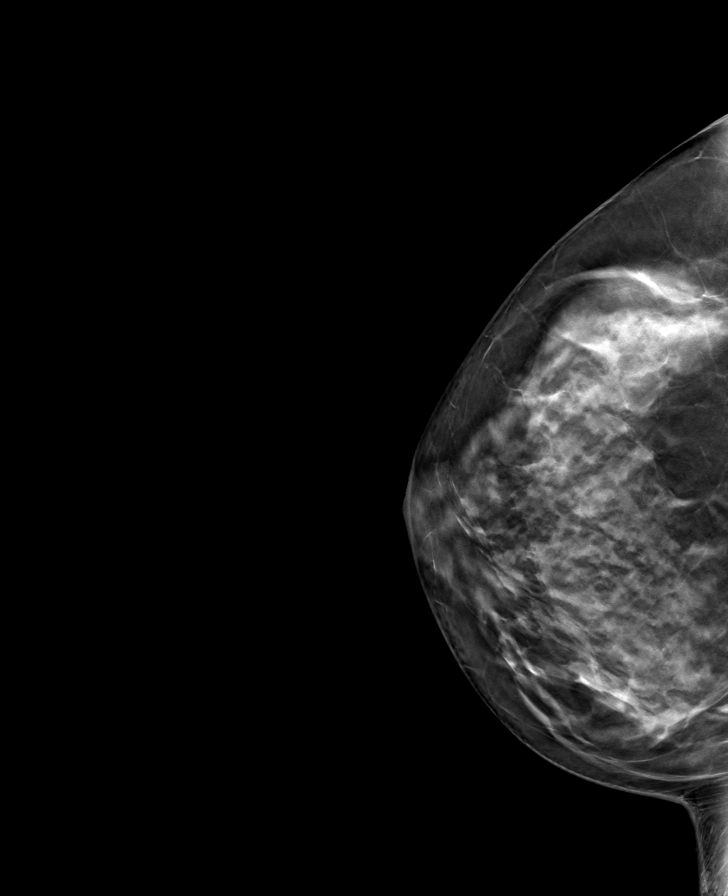

[L CC tomo · tomo slice 45/90.0]
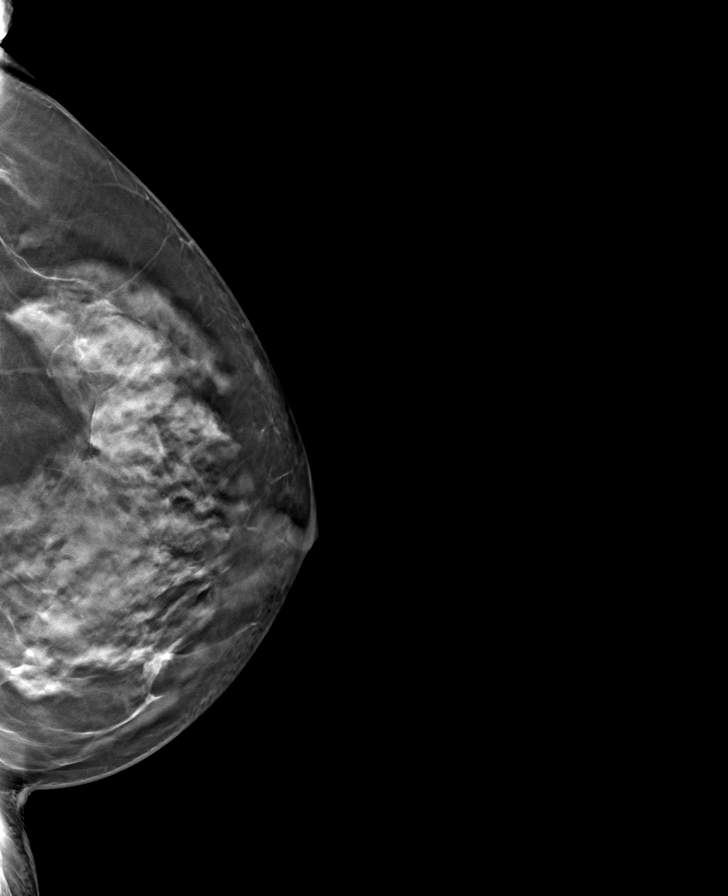

[8 of 24 positions shown; findings below may reference images not displayed]

ACR Breast Density Category c: The breast tissue is heterogeneously
dense, which may obscure small masses.
FINDINGS: There are no findings suspicious for malignancy.
IMPRESSION: No mammographic evidence of malignancy. A result letter of this
screening mammogram will be mailed directly to the patient.

RECOMMENDATION:
Screening mammogram in one year. (Code:[V2])

BI-RADS CATEGORY  1: Negative.

## 2021-11-11 DIAGNOSIS — Z1329 Encounter for screening for other suspected endocrine disorder: Secondary | ICD-10-CM | POA: Diagnosis not present

## 2021-11-11 DIAGNOSIS — G479 Sleep disorder, unspecified: Secondary | ICD-10-CM | POA: Diagnosis not present

## 2021-11-11 DIAGNOSIS — N951 Menopausal and female climacteric states: Secondary | ICD-10-CM | POA: Diagnosis not present

## 2021-11-11 DIAGNOSIS — E538 Deficiency of other specified B group vitamins: Secondary | ICD-10-CM | POA: Diagnosis not present

## 2021-11-11 DIAGNOSIS — E559 Vitamin D deficiency, unspecified: Secondary | ICD-10-CM | POA: Diagnosis not present

## 2021-11-25 DIAGNOSIS — N951 Menopausal and female climacteric states: Secondary | ICD-10-CM | POA: Diagnosis not present

## 2021-11-25 DIAGNOSIS — G479 Sleep disorder, unspecified: Secondary | ICD-10-CM | POA: Diagnosis not present

## 2021-11-25 DIAGNOSIS — G4700 Insomnia, unspecified: Secondary | ICD-10-CM | POA: Diagnosis not present

## 2021-11-25 DIAGNOSIS — E559 Vitamin D deficiency, unspecified: Secondary | ICD-10-CM | POA: Diagnosis not present

## 2021-11-27 ENCOUNTER — Ambulatory Visit (INDEPENDENT_AMBULATORY_CARE_PROVIDER_SITE_OTHER): Payer: BC Managed Care – PPO | Admitting: Family Medicine

## 2021-11-27 VITALS — BP 124/80 | HR 76 | Temp 97.9°F | Ht 63.0 in | Wt 171.8 lb

## 2021-11-27 DIAGNOSIS — E782 Mixed hyperlipidemia: Secondary | ICD-10-CM

## 2021-11-27 DIAGNOSIS — N951 Menopausal and female climacteric states: Secondary | ICD-10-CM

## 2021-11-27 DIAGNOSIS — Z1159 Encounter for screening for other viral diseases: Secondary | ICD-10-CM | POA: Diagnosis not present

## 2021-11-27 DIAGNOSIS — G2581 Restless legs syndrome: Secondary | ICD-10-CM | POA: Diagnosis not present

## 2021-11-27 HISTORY — DX: Menopausal and female climacteric states: N95.1

## 2021-11-27 LAB — LIPID PANEL
Cholesterol: 190 mg/dL (ref 0–200)
HDL: 57.1 mg/dL (ref >39.00)
LDL Cholesterol: 109 mg/dL — ABNORMAL HIGH (ref 0–99)
NonHDL: 132.76
Total CHOL/HDL Ratio: 3
Triglycerides: 121 mg/dL (ref 0.0–149.0)
VLDL: 24.2 mg/dL (ref 0.0–40.0)

## 2021-11-27 MED ORDER — FENOFIBRATE 145 MG PO TABS: 145.0000 mg | ORAL_TABLET | Freq: Every day | ORAL | 3 refills | Status: DC

## 2021-11-27 MED ORDER — PRAMIPEXOLE DIHYDROCHLORIDE 1 MG PO TABS: 1.0000 mg | ORAL_TABLET | Freq: Every day | ORAL | 3 refills | Status: DC

## 2021-11-27 NOTE — Progress Notes (Signed)
[facility] [address] [place] [address] Dept: [phone] Dept Fax: [phone]  Chronic Care Office Visit  Subjective:    Patient ID: Theresa Gonzalez, female    DOB: 27-Sep-1965, 56 y.o..   MRN: [id number]  Chief Complaint  Patient presents with   Follow-up    F/u meds.  No concerns  fasting today.   Needs Hep c screening    History of Present Illness:  Patient is in today for reassessment of chronic medical issues.  Theresa Gonzalez has a history of restless legs and is managed on Mirapex. She had been on trazodone, but felt ti was not adding to her therapy, so stopped this.   Theresa Gonzalez has a history of dyslipidemia, mainly with elevated triglycerides and is managed on Tricor.   Theresa Gonzalez is being seen at [facility]. She has been started on estradiol patch and progesterone. In addition, she was started on NP Thyroid. She notes she had sought their care because of concerns regarding fatigue and brain fog since she went through menopause.  Past Medical History: Patient Active Problem List   Diagnosis Date Noted   Menopausal state 11/27/2021   Cystic disease of liver 07/08/2021   Mesenteric cyst 07/08/2021   History of kidney stones 07/02/2021   Arthritis of ankle or foot, degenerative, right 03/16/2021   Prediabetes 02/27/2021   Chronic rhinitis 02/26/2021   Gastroesophageal reflux disease 02/26/2021   DUB (dysfunctional uterine bleeding) 11/28/2020   Facet arthropathy, lumbar 11/28/2020   Mild intermittent asthma, uncomplicated 11/28/2020   RLS (restless legs syndrome) 11/28/2020   SK (seborrheic keratosis) 11/28/2020   Vitamin D insufficiency 11/28/2020   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, 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   Mixed hyperlipidemia 02/04/2011   Past Surgical History:  Procedure Laterality Date   HERNIA REPAIR Bilateral    knee replacement Left 06/2019    LAPAROSCOPIC LYSIS OF ADHESIONS     TUBAL LIGATION     Family History  Problem Relation Age of Onset   Hypertension Mother    Hyperlipidemia Mother    Retinitis pigmentosa Mother    Heart disease Father    Diabetes Father    Prostate cancer Father    Cancer - Prostate Father    Diabetes Brother    Heart attack Maternal Grandmother    Stroke Maternal Grandfather    Heart attack Maternal Grandfather    Heart disease Paternal Grandmother    Epilepsy Paternal Grandmother    Diabetes Paternal Grandfather    Retinitis pigmentosa Maternal Aunt    Allergic rhinitis Neg Hx    Asthma Neg Hx    Eczema Neg Hx    Urticaria Neg Hx    Colon cancer Neg Hx    Rectal cancer Neg Hx    Stomach cancer Neg Hx    Pancreatic cancer Neg Hx    Liver cancer Neg Hx    Outpatient Medications Prior to Visit  Medication Sig Dispense Refill   albuterol (VENTOLIN HFA) 108 (90 Base) MCG/ACT inhaler Inhale into the lungs.     EPINEPHrine 0.3 mg/0.3 mL IJ SOAJ injection Inject into the muscle.     estradiol (VIVELLE-DOT) 0.025 MG/24HR Place onto the skin.     famotidine (PEPCID) 20 MG tablet Take one tablet twice a day to control reflux 180 tablet 1   fluticasone (FLONASE) 50 MCG/ACT nasal spray 2 sprays per nostril once a day as needed for stuffy nose. 16 g  5   levocetirizine (XYZAL) 5 MG tablet Take 1 tablet (5 mg total) by mouth every evening. 30 tablet 3   methocarbamol (ROBAXIN) 500 MG tablet Take 1 tablet (500 mg total) by mouth 2 (two) times daily as needed for muscle spasms. 20 tablet 0   NP THYROID 15 MG tablet Take 15 mg by mouth every morning.     omeprazole (PRILOSEC) 20 MG capsule Take 20 mg by mouth daily.     ondansetron (ZOFRAN) 4 MG tablet Take 1 tablet (4 mg total) by mouth every 8 (eight) hours as needed for nausea or vomiting. 20 tablet 0   progesterone (PROMETRIUM) 100 MG capsule Take by mouth.     fenofibrate (TRICOR) 145 MG tablet Take 1 tablet (145 mg total) by mouth daily. 90 tablet 3    pramipexole (MIRAPEX) 1 MG tablet Take 1 tablet (1 mg total) by mouth at bedtime. 90 tablet 3   No facility-administered medications prior to visit.   Allergies  Allergen Reactions   Penicillins Rash and Shortness Of Breath   Potassium Citrate Itching   Ropinirole Shortness Of Breath   Cetirizine Hcl    Nabumetone Rash   Nsaids Rash    Provider: Valere Dross CFM - Allergy Description: NSAIDs CFM - Allergy Annotation: rash   Sodium Hyaluronate Hives   Objective:   Today's Vitals   11/27/21 0759  BP: 124/80  Pulse: 76  Temp: 97.9 F (36.6 C)  TempSrc: Temporal  SpO2: 98%  Weight: 171 lb 12.8 oz (77.9 kg)  Height: 5\' 3"  (1.6 m)   Body mass index is 30.43 kg/m.   General: Well developed, well nourished. No acute distress. Psych: Alert and oriented. Normal mood and affect.  Health Maintenance Due  Topic Date Due   Hepatitis C Screening  Never done     Assessment & Plan:   1. Mixed hyperlipidemia Due for annual lipids today. I will continue her on fenofibrate.  - Lipid panel - fenofibrate (TRICOR) 145 MG tablet; Take 1 tablet (145 mg total) by mouth daily.  Dispense: 90 tablet; Refill: 3  2. RLS (restless legs syndrome) Stable on pramipexole.  - pramipexole (MIRAPEX) 1 MG tablet; Take 1 tablet (1 mg total) by mouth at bedtime.  Dispense: 90 tablet; Refill: 3  3. Menopausal state Currently managed by [facility] with estrogen and progesterone. Theresa Gonzalez shared labs that had been performed there. This did show an FSH confirming menopause. I did discuss risk vs. benefit of postmenopausal estrogen. It is unclear if this is warranted for those not experiencing vasomotor symptoms.  I also reviewed her thyroid studies, which showed normal TSH and T4, with a single value of free T3 that was 0.1 below the normal range. It is unclear if thyroid hormones will be beneficial.  4. Encounter for hepatitis C screening test for low risk patient  - HCV Ab w Reflex to Quant  PCR   Return in about 6 months (around 05/30/2022) for Reassessment.   06/01/2022, MD

## 2021-11-28 LAB — HCV INTERPRETATION

## 2021-11-28 LAB — HCV AB W REFLEX TO QUANT PCR: HCV Ab: NONREACTIVE

## 2021-12-01 ENCOUNTER — Ambulatory Visit: Payer: BC Managed Care – PPO | Admitting: Family Medicine

## 2021-12-15 ENCOUNTER — Ambulatory Visit: Payer: BC Managed Care – PPO | Admitting: Family Medicine

## 2021-12-15 ENCOUNTER — Ambulatory Visit: Payer: Self-pay

## 2021-12-15 ENCOUNTER — Encounter: Payer: Self-pay | Admitting: Physician Assistant

## 2021-12-15 ENCOUNTER — Ambulatory Visit (INDEPENDENT_AMBULATORY_CARE_PROVIDER_SITE_OTHER): Payer: BC Managed Care – PPO | Admitting: Physician Assistant

## 2021-12-15 DIAGNOSIS — S83241A Other tear of medial meniscus, current injury, right knee, initial encounter: Secondary | ICD-10-CM

## 2021-12-15 DIAGNOSIS — M25561 Pain in right knee: Secondary | ICD-10-CM

## 2021-12-15 HISTORY — DX: Other tear of medial meniscus, current injury, right knee, initial encounter: S83.241A

## 2021-12-15 NOTE — Progress Notes (Signed)
Office Visit Note   Patient: Theresa Gonzalez           Date of Birth: 1966-02-08           MRN: [id number] Visit Date: 12/15/2021              Requested by: Loyola Mast, MD [address] [place],  [place] [address] PCP: Loyola Mast, MD  Chief Complaint  Patient presents with   Right Knee - New Patient (Initial Visit)      HPI: Patient is a pleasant 56 year old woman with a chief complaint of right medial knee pain and swelling locking catching.  She has a history of a torn meniscus in the left knee that eventually led to advanced arthritis changes and a left knee replacement.  She became very concerned when she began having popping and catching after moving around in bed.  She now has difficulty straightening her knee all the way and bending it  Assessment & Plan: Visit Diagnoses:  1. Acute pain of right knee   2. Acute medial meniscus tear of right knee, initial encounter     Plan: She does have swelling and acute pain over the medial joint line.  Findings concerning for medial meniscus tear.  She will obtain a hinged brace.  Unfortunately she has an allergy to anti-inflammatories.  She will try topical anti-inflammatories which are okay as well as Tylenol.  In the meantime we will get an MRI follow-up once this is completed  Follow-Up Instructions: No follow-ups on file.   Ortho Exam  Patient is alert, oriented, no adenopathy, well-dressed, normal affect, normal respiratory effort. Right knee she does not have an effusion but has swelling on the medial side of the knee.  She has good varus valgus stability.  Good anterior stability.  She is acutely tender over the medial joint line.  She has an antalgic gait secondary to her pain  Imaging: No results found. No images are attached to the encounter.  Labs: Lab Results  Component Value Date   HGBA1C 6.0 02/27/2021   ESRSEDRATE 2 01/12/2021   CRP 1 01/12/2021     Lab Results  Component Value Date    ALBUMIN 4.7 07/30/2021   ALBUMIN 4.5 07/24/2021   ALBUMIN 4.5 07/02/2021    No results found for: "MG" No results found for: "VD25OH"  No results found for: "PREALBUMIN"    Latest Ref Rng & Units 07/30/2021    5:02 PM 07/24/2021    8:00 AM 07/02/2021    8:28 AM  CBC EXTENDED  WBC 4.0 - 10.5 K/uL 5.8  4.3  4.5   RBC 3.87 - 5.11 MIL/uL 4.69  4.87  4.80   Hemoglobin 12.0 - 15.0 g/dL 92.4  46.2  86.3   HCT 36.0 - 46.0 % 39.2  40.0  40.4   Platelets 150 - 400 K/uL 274  243  279.0   NEUT# 1.7 - 7.7 K/uL  2.2  2.4   Lymph# 0.7 - 4.0 K/uL  1.5  1.5      There is no height or weight on file to calculate BMI.  Orders:  Orders Placed This Encounter  Procedures   XR KNEE 3 VIEW RIGHT   MR Knee Right w/o contrast   No orders of the defined types were placed in this encounter.    Procedures: No procedures performed  Clinical Data: No additional findings.  ROS:  All other systems negative, except as noted in the HPI. Review  of Systems  Objective: Vital Signs: There were no vitals taken for this visit.  Specialty Comments:  No specialty comments available.  PMFS History: Patient Active Problem List   Diagnosis Date Noted   Acute medial meniscus tear of right knee 12/15/2021   Menopausal state 11/27/2021   Cystic disease of liver 07/08/2021   Mesenteric cyst 07/08/2021   History of kidney stones 07/02/2021   Arthritis of ankle or foot, degenerative, right 03/16/2021   Prediabetes 02/27/2021   Chronic rhinitis 02/26/2021   Gastroesophageal reflux disease 02/26/2021   DUB (dysfunctional uterine bleeding) 11/28/2020   Facet arthropathy, lumbar 11/28/2020   Mild intermittent asthma, uncomplicated 11/28/2020   RLS (restless legs syndrome) 11/28/2020   SK (seborrheic keratosis) 11/28/2020   Vitamin D insufficiency 11/28/2020   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, 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   Mixed hyperlipidemia 02/04/2011   Past Medical History:   Diagnosis Date   Angio-edema    Asthma    Recurrent upper respiratory infection (URI)    Urticaria     Family History  Problem Relation Age of Onset   Hypertension Mother    Hyperlipidemia Mother    Retinitis pigmentosa Mother    Heart disease Father    Diabetes Father    Prostate cancer Father    Cancer - Prostate Father    Diabetes Brother    Heart attack Maternal Grandmother    Stroke Maternal Grandfather    Heart attack Maternal Grandfather    Heart disease Paternal Grandmother    Epilepsy Paternal Grandmother    Diabetes Paternal Grandfather    Retinitis pigmentosa Maternal Aunt    Allergic rhinitis Neg Hx    Asthma Neg Hx    Eczema Neg Hx    Urticaria Neg Hx    Colon cancer Neg Hx    Rectal cancer Neg Hx    Stomach cancer Neg Hx    Pancreatic cancer Neg Hx    Liver cancer Neg Hx     Past Surgical History:  Procedure Laterality Date   HERNIA REPAIR Bilateral    knee replacement Left 06/2019   LAPAROSCOPIC LYSIS OF ADHESIONS     TUBAL LIGATION     Social History   Occupational History   Occupation: Consulting civil engineer  Tobacco Use   Smoking status: Never   Smokeless tobacco: Never  Vaping Use   Vaping Use: Never used  Substance and Sexual Activity   Alcohol use: Yes    Comment: socially   Drug use: Never   Sexual activity: Yes

## 2021-12-18 DIAGNOSIS — N951 Menopausal and female climacteric states: Secondary | ICD-10-CM | POA: Diagnosis not present

## 2021-12-18 DIAGNOSIS — G479 Sleep disorder, unspecified: Secondary | ICD-10-CM | POA: Diagnosis not present

## 2021-12-18 DIAGNOSIS — Z131 Encounter for screening for diabetes mellitus: Secondary | ICD-10-CM | POA: Diagnosis not present

## 2021-12-18 DIAGNOSIS — G4700 Insomnia, unspecified: Secondary | ICD-10-CM | POA: Diagnosis not present

## 2021-12-18 DIAGNOSIS — Z1329 Encounter for screening for other suspected endocrine disorder: Secondary | ICD-10-CM | POA: Diagnosis not present

## 2022-01-11 ENCOUNTER — Ambulatory Visit (INDEPENDENT_AMBULATORY_CARE_PROVIDER_SITE_OTHER): Payer: BC Managed Care – PPO | Admitting: Physician Assistant

## 2022-01-11 ENCOUNTER — Ambulatory Visit (INDEPENDENT_AMBULATORY_CARE_PROVIDER_SITE_OTHER): Payer: BC Managed Care – PPO

## 2022-01-11 ENCOUNTER — Encounter: Payer: Self-pay | Admitting: Physician Assistant

## 2022-01-11 DIAGNOSIS — M25572 Pain in left ankle and joints of left foot: Secondary | ICD-10-CM

## 2022-01-11 NOTE — Progress Notes (Signed)
Refilled her follow-up with her  Office Visit Note   Patient: Theresa Gonzalez           Date of Birth: Sep 26, 1965           MRN: [id number] Visit Date: 01/11/2022              Requested by: Loyola Mast, MD [address] [place],  [place] [address] PCP: Loyola Mast, MD  Chief Complaint  Patient presents with   Left Ankle - Pain      HPI: Patient is a pleasant 56 year old woman who I have seen in the past for right knee pain.  She actually has an MRI tomorrow and has a scheduled follow-up with [place] next week.  She comes in today because she has had a 3-week history of left lateral ankle swelling.  Denies any previous history though may have rolled his ankle sometime in the past.  She said this began [place]  She was on a lot of uneven ground and on her feet quite a bit.  She denies any foot or medial sided ankle pain  Assessment & Plan: Visit Diagnoses:  1. Pain in left ankle and joints of left foot     Plan: Findings consistent with some irritation either of the subtalar joint or the lateral ligaments.  We will put her in a brace and immobilizer she can use topical Voltaren gel we will see how she does in the next week 1 week when she follows up for her knee  Follow-Up Instructions: No follow-ups on file.   Ortho Exam  Patient is alert, oriented, no adenopathy, well-dressed, normal affect, normal respiratory effort. Examination of her ankle she has mild lateral soft tissue swelling.  No instability.  She does have a cavus foot.  From behind she has some swelling but no significant varus alignment.  She has good strength with dorsiflexion plantarflexion eversion inversion eversion reproduces slight amount of pain.  No tenderness in the peroneals however is tender over the ATFL foot is warm pulses are intact no redness or erythema  Imaging: XR Ankle Complete Left  Result Date: 01/11/2022 Radiographs of her ankle  demonstrate cavus foot alignment.  Well-maintained alignment through the ankle.  Cannot appreciate any lateral osseous or changes in the area of pain  No images are attached to the encounter.  Labs: Lab Results  Component Value Date   HGBA1C 6.0 02/27/2021   ESRSEDRATE 2 01/12/2021   CRP 1 01/12/2021     Lab Results  Component Value Date   ALBUMIN 4.7 07/30/2021   ALBUMIN 4.5 07/24/2021   ALBUMIN 4.5 07/02/2021    No results found for: "MG" No results found for: "VD25OH"  No results found for: "PREALBUMIN"    Latest Ref Rng & Units 07/30/2021    5:02 PM 07/24/2021    8:00 AM 07/02/2021    8:28 AM  CBC EXTENDED  WBC 4.0 - 10.5 K/uL 5.8  4.3  4.5   RBC 3.87 - 5.11 MIL/uL 4.69  4.87  4.80   Hemoglobin 12.0 - 15.0 g/dL 51.0  25.8  52.7   HCT 36.0 - 46.0 % 39.2  40.0  40.4   Platelets 150 - 400 K/uL 274  243  279.0   NEUT# 1.7 - 7.7 K/uL  2.2  2.4   Lymph# 0.7 - 4.0 K/uL  1.5  1.5      There is  no height or weight on file to calculate BMI.  Orders:  Orders Placed This Encounter  Procedures   XR Ankle Complete Left   No orders of the defined types were placed in this encounter.    Procedures: No procedures performed  Clinical Data: No additional findings.  ROS:  All other systems negative, except as noted in the HPI. Review of Systems  Objective: Vital Signs: There were no vitals taken for this visit.  Specialty Comments:  No specialty comments available.  PMFS History: Patient Active Problem List   Diagnosis Date Noted   Acute medial meniscus tear of right knee 12/15/2021   Menopausal state 11/27/2021   Cystic disease of liver 07/08/2021   Mesenteric cyst 07/08/2021   History of kidney stones 07/02/2021   Arthritis of ankle or foot, degenerative, right 03/16/2021   Prediabetes 02/27/2021   Chronic rhinitis 02/26/2021   Gastroesophageal reflux disease 02/26/2021   DUB (dysfunctional uterine bleeding) 11/28/2020   Facet arthropathy, lumbar  11/28/2020   Mild intermittent asthma, uncomplicated 11/28/2020   RLS (restless legs syndrome) 11/28/2020   SK (seborrheic keratosis) 11/28/2020   Vitamin D insufficiency 11/28/2020   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, 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   Mixed hyperlipidemia 02/04/2011   Past Medical History:  Diagnosis Date   Angio-edema    Asthma    Recurrent upper respiratory infection (URI)    Urticaria     Family History  Problem Relation Age of Onset   Hypertension Mother    Hyperlipidemia Mother    Retinitis pigmentosa Mother    Heart disease Father    Diabetes Father    Prostate cancer Father    Cancer - Prostate Father    Diabetes Brother    Heart attack Maternal Grandmother    Stroke Maternal Grandfather    Heart attack Maternal Grandfather    Heart disease Paternal Grandmother    Epilepsy Paternal Grandmother    Diabetes Paternal Grandfather    Retinitis pigmentosa Maternal Aunt    Allergic rhinitis Neg Hx    Asthma Neg Hx    Eczema Neg Hx    Urticaria Neg Hx    Colon cancer Neg Hx    Rectal cancer Neg Hx    Stomach cancer Neg Hx    Pancreatic cancer Neg Hx    Liver cancer Neg Hx     Past Surgical History:  Procedure Laterality Date   HERNIA REPAIR Bilateral    knee replacement Left 06/2019   LAPAROSCOPIC LYSIS OF ADHESIONS     TUBAL LIGATION     Social History   Occupational History   Occupation: Consulting civil engineer  Tobacco Use   Smoking status: Never   Smokeless tobacco: Never  Vaping Use   Vaping Use: Never used  Substance and Sexual Activity   Alcohol use: Yes    Comment: socially   Drug use: Never   Sexual activity: Yes

## 2022-01-12 ENCOUNTER — Ambulatory Visit
Admission: RE | Admit: 2022-01-12 | Discharge: 2022-01-12 | Disposition: A | Discharge status: Home / Self Care | Payer: BC Managed Care – PPO | Source: Ambulatory Visit | Attending: Physician Assistant | Admitting: Physician Assistant

## 2022-01-12 DIAGNOSIS — M25461 Effusion, right knee: Secondary | ICD-10-CM | POA: Diagnosis not present

## 2022-01-12 DIAGNOSIS — M1711 Unilateral primary osteoarthritis, right knee: Secondary | ICD-10-CM | POA: Diagnosis not present

## 2022-01-12 DIAGNOSIS — M25561 Pain in right knee: Secondary | ICD-10-CM

## 2022-01-12 DIAGNOSIS — S83281A Other tear of lateral meniscus, current injury, right knee, initial encounter: Secondary | ICD-10-CM | POA: Diagnosis not present

## 2022-01-19 ENCOUNTER — Ambulatory Visit (INDEPENDENT_AMBULATORY_CARE_PROVIDER_SITE_OTHER): Payer: BC Managed Care – PPO | Admitting: Orthopaedic Surgery

## 2022-01-19 ENCOUNTER — Encounter: Payer: Self-pay | Admitting: Orthopaedic Surgery

## 2022-01-19 DIAGNOSIS — M25572 Pain in left ankle and joints of left foot: Secondary | ICD-10-CM | POA: Insufficient documentation

## 2022-01-19 DIAGNOSIS — M1711 Unilateral primary osteoarthritis, right knee: Secondary | ICD-10-CM

## 2022-01-19 HISTORY — DX: Unilateral primary osteoarthritis, right knee: M17.11

## 2022-01-19 HISTORY — DX: Pain in left ankle and joints of left foot: M25.572

## 2022-01-19 NOTE — Progress Notes (Signed)
Office Visit Note   Patient: Theresa Gonzalez           Date of Birth: Jan 31, 1966           MRN: [id number] Visit Date: 01/19/2022              Requested by: Haydee Salter, MD [place],  [place] [address] PCP: Haydee Salter, MD   Assessment & Plan: Visit Diagnoses:  1. Unilateral primary osteoarthritis, right knee   2. Pain in left ankle and joints of left foot     Plan: Theresa Gonzalez is experiencing right knee pain for some time as outlined in her prior office note.  MRI scan was obtained and she is here for the results.  The findings demonstrate multiple degenerative tears of the medial and some extent the lateral meniscus associated with tricompartmental degenerative changes predominantly in the medial and patellofemoral compartments.  Theresa Gonzalez's had a prior left total knee replacement prior to which she had several knee arthroscopies.  She is never had any procedure performed in her right knee.  Long discussion regarding all of the above including reviewing her x-rays.  I believe the majority of her pain is related to the arthritis.  We discussed over-the-counter medicines.  She has been using a brace that is made a difference.  Only on occasion she has some popping some mechanical symptoms do not seem to be an issue.  I do not think she is a candidate for knee arthroscopy.  Also discussed cortisone injections but she would like to "wait".  She has had prior viscosupplementation in the left knee with significant reaction requiring emergency room visits.  Right knee exam today was pretty benign.  No effusion and mostly medial joint pain.  Full extension flexed well over 95 degrees without instability.  Also has had some recent problems with her lateral left ankle it appears to be a sprain as there was some tenderness some mild swelling over the anterior talofibular ligament without instability.  She does have an ankle support and I would suggest she wear that for  least another several weeks.  In addition she appears to have a partial tear of the left Achilles with a small painful nodule and will insert heel lifts in both of her shoes which I think will help and she can continue with the Voltaren gel.  Ultimately we could consider MRI scan.  Discussed all the above with her.  Follow-Up Instructions: Return if symptoms worsen or fail to improve.   Orders:  No orders of the defined types were placed in this encounter.  No orders of the defined types were placed in this encounter.     Procedures: No procedures performed   Clinical Data: No additional findings.   Subjective: Chief Complaint  Patient presents with   Right Knee - Follow-up   Left Ankle - Follow-up   Patient presents today for follow up of her right knee and left ankle pain. Patient states that with her right ankle she has been having increased pain while she is walking. Patient does mentions increased popping pain as well. Knee brace is being worn on the right side.  With regards to her left ankle patient does mention that she has been having increased swelling in her left ankle. States that no new injuries or falls have occurred since last office visit. Ankle brace is being used along with ibuprofen and tylenol.  Review of Systems   Objective: Vital Signs:  There were no vitals taken for this visit.  Physical Exam Constitutional:      Appearance: She is well-developed.  Eyes:     Pupils: Pupils are equal, round, and reactive to light.  Pulmonary:     Effort: Pulmonary effort is normal.  Skin:    General: Skin is warm and dry.  Neurological:     Mental Status: She is alert and oriented to person, place, and time.  Psychiatric:        Behavior: Behavior normal.     Ortho Exam awake alert and oriented x3.  Comfortable sitting.  Right knee was not hot red warm or swollen.  No instability.  Full extension flex at least to 100 degrees with.  Mostly medial joint pain.   Little bit of swelling in the pes anserine bursa but no pain.  No redness.  Some patella crepitation but no pain with compression.  Left ankle with some mild tenderness over the anterior talofibular ligament.  No instability with talar tilting or with an anterior drawer sign.  Neurologically intact.  There was a very small painful nodule on the Achilles tendon several inches proximal to its insertion.  The tendon was not hot or red.  No evidence of a full tear  Specialty Comments:  No specialty comments available.  Imaging: No results found.   PMFS History: Patient Active Problem List   Diagnosis Date Noted   Unilateral primary osteoarthritis, right knee 01/19/2022   Pain in left ankle and joints of left foot 01/19/2022   Acute medial meniscus tear of right knee 12/15/2021   Menopausal state 11/27/2021   Cystic disease of liver 07/08/2021   Mesenteric cyst 07/08/2021   History of kidney stones 07/02/2021   Arthritis of ankle or foot, degenerative, right 03/16/2021   Prediabetes 02/27/2021   Chronic rhinitis 02/26/2021   Gastroesophageal reflux disease 02/26/2021   DUB (dysfunctional uterine bleeding) 11/28/2020   Facet arthropathy, lumbar 11/28/2020   Mild intermittent asthma, uncomplicated 11/28/2020   RLS (restless legs syndrome) 11/28/2020   SK (seborrheic keratosis) 11/28/2020   Vitamin D insufficiency 11/28/2020   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, 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   Mixed hyperlipidemia 02/04/2011   Past Medical History:  Diagnosis Date   Angio-edema    Asthma    Recurrent upper respiratory infection (URI)    Urticaria     Family History  Problem Relation Age of Onset   Hypertension Mother    Hyperlipidemia Mother    Retinitis pigmentosa Mother    Heart disease Father    Diabetes Father    Prostate cancer Father    Cancer - Prostate Father    Diabetes Brother    Heart attack Maternal Grandmother    Stroke Maternal Grandfather     Heart attack Maternal Grandfather    Heart disease Paternal Grandmother    Epilepsy Paternal Grandmother    Diabetes Paternal Grandfather    Retinitis pigmentosa Maternal Aunt    Allergic rhinitis Neg Hx    Asthma Neg Hx    Eczema Neg Hx    Urticaria Neg Hx    Colon cancer Neg Hx    Rectal cancer Neg Hx    Stomach cancer Neg Hx    Pancreatic cancer Neg Hx    Liver cancer Neg Hx     Past Surgical History:  Procedure Laterality Date   HERNIA REPAIR Bilateral    knee replacement Left 06/2019   LAPAROSCOPIC LYSIS OF ADHESIONS  TUBAL LIGATION     Social History   Occupational History   Occupation: Consulting civil engineer  Tobacco Use   Smoking status: Never   Smokeless tobacco: Never  Vaping Use   Vaping Use: Never used  Substance and Sexual Activity   Alcohol use: Yes    Comment: socially   Drug use: Never   Sexual activity: Yes

## 2022-02-03 ENCOUNTER — Ambulatory Visit (INDEPENDENT_AMBULATORY_CARE_PROVIDER_SITE_OTHER): Payer: BC Managed Care – PPO | Admitting: Orthopaedic Surgery

## 2022-02-03 ENCOUNTER — Encounter: Payer: Self-pay | Admitting: Orthopaedic Surgery

## 2022-02-03 DIAGNOSIS — M25572 Pain in left ankle and joints of left foot: Secondary | ICD-10-CM

## 2022-02-03 DIAGNOSIS — M1711 Unilateral primary osteoarthritis, right knee: Secondary | ICD-10-CM | POA: Diagnosis not present

## 2022-02-03 MED ORDER — LIDOCAINE HCL 1 % IJ SOLN
2.0000 mL | INTRAMUSCULAR | Status: AC | PRN
  Administered 2022-02-03: 2 mL

## 2022-02-03 MED ORDER — BUPIVACAINE HCL 0.25 % IJ SOLN
2.0000 mL | INTRAMUSCULAR | Status: AC | PRN
  Administered 2022-02-03: 2 mL via INTRA_ARTICULAR

## 2022-02-03 MED ORDER — METHYLPREDNISOLONE ACETATE 40 MG/ML IJ SUSP
80.0000 mg | INTRAMUSCULAR | Status: AC | PRN
  Administered 2022-02-03: 80 mg via INTRA_ARTICULAR

## 2022-02-03 NOTE — Progress Notes (Signed)
Office Visit Note   Patient: Theresa Gonzalez           Date of Birth: 1965/10/23           MRN: [id number] Visit Date: 02/03/2022              Requested by: Loyola Mast, MD [address] [place],  [place] [address] PCP: Loyola Mast, MD   Assessment & Plan: Visit Diagnoses:  1. Unilateral primary osteoarthritis, right knee     Plan: Theresa Gonzalez has tricompartmental degenerative arthritis of her right knee and continues to have some trouble.  We had discussed cortisone injection in the past and she like to proceed.  This was performed along the medial compartment of her knee we will just monitor her response.  She has had a prior MRI scan demonstrating tricompartmental degenerative changes with areas of full-thickness cartilage loss she also has some tearing of the medial lateral meniscus that I do not think would be amenable to debridement.  She has a left total knee replacement which was preceded by 3 knee arthroscopies.  In addition she is having some chronic pain around her left ankle.  She has what appears to be a partial tear of the Achilles tendon but also evidence of pain along the ankle.  She might have some mild degenerative change by x-ray.  We will order an MRI scan  Follow-Up Instructions: Return After MRI scan left ankle.   Orders:  Orders Placed This Encounter  Procedures   Large Joint Inj: R knee   No orders of the defined types were placed in this encounter.     Procedures: Large Joint Inj: R knee on 02/03/2022 10:16 AM Indications: pain and diagnostic evaluation Details: 25 G 1.5 in needle, anteromedial approach  Arthrogram: No  Medications: 2 mL lidocaine 1 %; 80 mg methylPREDNISolone acetate 40 MG/ML; 2 mL bupivacaine 0.25 % Procedure, treatment alternatives, risks and benefits explained, specific risks discussed. Consent was given by the patient. Immediately prior to procedure a time out was called to verify the correct patient, procedure,  equipment, support staff and site/side marked as required. Patient was prepped and draped in the usual sterile fashion.       Clinical Data: No additional findings.   Subjective: Chief Complaint  Patient presents with   Right Knee - Follow-up   Left Ankle - Follow-up   Patient presents today for follow up of her right knee and left ankle. She states that with her right knee she will notice pain if she does quick movements. With her left ankle she states that she has been having constant burning pains. Reports that she has been wearing her ankle brace however does not feel like this has been helping. She still has been having continued swelling. Ibuprofen is being used prn.   Review of Systems   Objective: Vital Signs: There were no vitals taken for this visit.  Physical Exam Constitutional:      Appearance: She is well-developed.  Eyes:     Pupils: Pupils are equal, round, and reactive to light.  Pulmonary:     Effort: Pulmonary effort is normal.  Skin:    General: Skin is warm and dry.  Neurological:     Mental Status: She is alert and oriented to person, place, and time.  Psychiatric:        Behavior: Behavior normal.     Ortho Exam awake alert and oriented x3.  Comfortable sitting.  Right knee  was not hot red warm or swollen.  There was medial joint tenderness diffusely.  No instability.  No effusion.  Full extension at least 105 degrees of flexion.  Left ankle with some tenderness along the anterior ankle.  There is a knot on the Achilles tendon which was previously identified and probably consistent with a partial tear.  Might have a little loss of dorsiflexion consistent with the impingement.  No instability.  Neurologically intact  Specialty Comments:  No specialty comments available.  Imaging: No results found.   PMFS History: Patient Active Problem List   Diagnosis Date Noted   Unilateral primary osteoarthritis, right knee 01/19/2022   Pain in left  ankle and joints of left foot 01/19/2022   Acute medial meniscus tear of right knee 12/15/2021   Menopausal state 11/27/2021   Cystic disease of liver 07/08/2021   Mesenteric cyst 07/08/2021   History of kidney stones 07/02/2021   Arthritis of ankle or foot, degenerative, right 03/16/2021   Prediabetes 02/27/2021   Chronic rhinitis 02/26/2021   Gastroesophageal reflux disease 02/26/2021   DUB (dysfunctional uterine bleeding) 11/28/2020   Facet arthropathy, lumbar 11/28/2020   Mild intermittent asthma, uncomplicated 11/28/2020   RLS (restless legs syndrome) 11/28/2020   SK (seborrheic keratosis) 11/28/2020   Vitamin D insufficiency 11/28/2020   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, 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   Mixed hyperlipidemia 02/04/2011   Past Medical History:  Diagnosis Date   Angio-edema    Asthma    Recurrent upper respiratory infection (URI)    Urticaria     Family History  Problem Relation Age of Onset   Hypertension Mother    Hyperlipidemia Mother    Retinitis pigmentosa Mother    Heart disease Father    Diabetes Father    Prostate cancer Father    Cancer - Prostate Father    Diabetes Brother    Heart attack Maternal Grandmother    Stroke Maternal Grandfather    Heart attack Maternal Grandfather    Heart disease Paternal Grandmother    Epilepsy Paternal Grandmother    Diabetes Paternal Grandfather    Retinitis pigmentosa Maternal Aunt    Allergic rhinitis Neg Hx    Asthma Neg Hx    Eczema Neg Hx    Urticaria Neg Hx    Colon cancer Neg Hx    Rectal cancer Neg Hx    Stomach cancer Neg Hx    Pancreatic cancer Neg Hx    Liver cancer Neg Hx     Past Surgical History:  Procedure Laterality Date   HERNIA REPAIR Bilateral    knee replacement Left 06/2019   LAPAROSCOPIC LYSIS OF ADHESIONS     TUBAL LIGATION     Social History   Occupational History   Occupation: Consulting civil engineer  Tobacco Use   Smoking status: Never   Smokeless tobacco:  Never  Vaping Use   Vaping Use: Never used  Substance and Sexual Activity   Alcohol use: Yes    Comment: socially   Drug use: Never   Sexual activity: Yes

## 2022-02-05 DIAGNOSIS — N951 Menopausal and female climacteric states: Secondary | ICD-10-CM | POA: Diagnosis not present

## 2022-02-05 DIAGNOSIS — Z1329 Encounter for screening for other suspected endocrine disorder: Secondary | ICD-10-CM | POA: Diagnosis not present

## 2022-02-05 DIAGNOSIS — R7309 Other abnormal glucose: Secondary | ICD-10-CM | POA: Diagnosis not present

## 2022-02-07 ENCOUNTER — Ambulatory Visit
Admission: RE | Admit: 2022-02-07 | Discharge: 2022-02-07 | Disposition: A | Discharge status: Home / Self Care | Payer: BC Managed Care – PPO | Source: Ambulatory Visit | Attending: Orthopaedic Surgery | Admitting: Orthopaedic Surgery

## 2022-02-07 DIAGNOSIS — M25572 Pain in left ankle and joints of left foot: Secondary | ICD-10-CM

## 2022-02-07 DIAGNOSIS — M7989 Other specified soft tissue disorders: Secondary | ICD-10-CM | POA: Diagnosis not present

## 2022-02-07 DIAGNOSIS — S86312A Strain of muscle(s) and tendon(s) of peroneal muscle group at lower leg level, left leg, initial encounter: Secondary | ICD-10-CM | POA: Diagnosis not present

## 2022-02-07 DIAGNOSIS — G8929 Other chronic pain: Secondary | ICD-10-CM | POA: Diagnosis not present

## 2022-02-09 ENCOUNTER — Ambulatory Visit (INDEPENDENT_AMBULATORY_CARE_PROVIDER_SITE_OTHER): Payer: BC Managed Care – PPO | Admitting: Physician Assistant

## 2022-02-09 ENCOUNTER — Encounter: Payer: Self-pay | Admitting: Physician Assistant

## 2022-02-09 DIAGNOSIS — M25572 Pain in left ankle and joints of left foot: Secondary | ICD-10-CM | POA: Diagnosis not present

## 2022-02-09 NOTE — Progress Notes (Deleted)
Office Visit Note   Patient: Theresa Gonzalez           Date of Birth: [birth year]           MRN: [id number] Visit Date: 02/09/2022              Requested by: Loyola Mast, MD [address] [place],  [place] [address] PCP: Loyola Mast, MD  No chief complaint on file.     HPI: ***  Assessment & Plan: Visit Diagnoses:  1. Pain in left ankle and joints of left foot     Plan: ***  Follow-Up Instructions: No follow-ups on file.   Ortho Exam  Patient is alert, oriented, no adenopathy, well-dressed, normal affect, normal respiratory effort. ***  Imaging: No results found. No images are attached to the encounter.  Labs: Lab Results  Component Value Date   HGBA1C 6.0 02/27/2021   ESRSEDRATE 2 01/12/2021   CRP 1 01/12/2021     Lab Results  Component Value Date   ALBUMIN 4.7 07/30/2021   ALBUMIN 4.5 07/24/2021   ALBUMIN 4.5 07/02/2021    No results found for: "MG" No results found for: "VD25OH"  No results found for: "PREALBUMIN"    Latest Ref Rng & Units 07/30/2021    5:02 PM 07/24/2021    8:00 AM 07/02/2021    8:28 AM  CBC EXTENDED  WBC 4.0 - 10.5 K/uL 5.8  4.3  4.5   RBC 3.87 - 5.11 MIL/uL 4.69  4.87  4.80   Hemoglobin 12.0 - 15.0 g/dL 67.3  41.9  37.9   HCT 36.0 - 46.0 % 39.2  40.0  40.4   Platelets 150 - 400 K/uL 274  243  279.0   NEUT# 1.7 - 7.7 K/uL  2.2  2.4   Lymph# 0.7 - 4.0 K/uL  1.5  1.5      There is no height or weight on file to calculate BMI.  Orders:  No orders of the defined types were placed in this encounter.  No orders of the defined types were placed in this encounter.    Procedures: No procedures performed  Clinical Data: No additional findings.  ROS:  All other systems negative, except as noted in the HPI. Review of Systems  Objective: Vital Signs: There were no vitals taken for this visit.  Specialty Comments:  No specialty comments available.  PMFS History: Patient Active Problem List    Diagnosis Date Noted   Unilateral primary osteoarthritis, right knee 01/19/2022   Pain in left ankle and joints of left foot 01/19/2022   Acute medial meniscus tear of right knee 12/15/2021   Menopausal state 11/27/2021   Cystic disease of liver 07/08/2021   Mesenteric cyst 07/08/2021   History of kidney stones 07/02/2021   Arthritis of ankle or foot, degenerative, right 03/16/2021   Prediabetes 02/27/2021   Chronic rhinitis 02/26/2021   Gastroesophageal reflux disease 02/26/2021   DUB (dysfunctional uterine bleeding) 11/28/2020   Facet arthropathy, lumbar 11/28/2020   Mild intermittent asthma, uncomplicated 11/28/2020   RLS (restless legs syndrome) 11/28/2020   SK (seborrheic keratosis) 11/28/2020   Vitamin D insufficiency 11/28/2020   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, 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   Mixed hyperlipidemia 02/04/2011   Past Medical History:  Diagnosis Date   Angio-edema    Asthma    Recurrent upper respiratory infection (URI)    Urticaria     Family History  Problem Relation Age of Onset  Hypertension Mother    Hyperlipidemia Mother    Retinitis pigmentosa Mother    Heart disease Father    Diabetes Father    Prostate cancer Father    Cancer - Prostate Father    Diabetes Brother    Heart attack Maternal Grandmother    Stroke Maternal Grandfather    Heart attack Maternal Grandfather    Heart disease Paternal Grandmother    Epilepsy Paternal Grandmother    Diabetes Paternal Grandfather    Retinitis pigmentosa Maternal Aunt    Allergic rhinitis Neg Hx    Asthma Neg Hx    Eczema Neg Hx    Urticaria Neg Hx    Colon cancer Neg Hx    Rectal cancer Neg Hx    Stomach cancer Neg Hx    Pancreatic cancer Neg Hx    Liver cancer Neg Hx     Past Surgical History:  Procedure Laterality Date   HERNIA REPAIR Bilateral    knee replacement Left 06/2019   LAPAROSCOPIC LYSIS OF ADHESIONS     TUBAL LIGATION     Social History    Occupational History   Occupation: Consulting civil engineer  Tobacco Use   Smoking status: Never   Smokeless tobacco: Never  Vaping Use   Vaping Use: Never used  Substance and Sexual Activity   Alcohol use: Yes    Comment: socially   Drug use: Never   Sexual activity: Yes

## 2022-02-09 NOTE — Progress Notes (Deleted)
Office Visit Note   Patient: Theresa Gonzalez           Date of Birth: 1966/03/18           MRN: [id number] Visit Date: 02/09/2022              Requested by: Loyola Mast, MD [address] [place],  [place] [address] PCP: Loyola Mast, MD      HPI: Patient is a very pleasant 56 year old woman who has had ongoing problems with her left ankle for couple months now.  Usually she denies any significant injury but is was out at a camp on uneven ground and was having trouble with her knee.  While her knee has improved she had continuing pain and swelling on the lateral side of her left ankle.  She was seen by Dr. Cleophas Dunker who recommended an MRI with follow-up.  She continues to say that the pain is most prominent on the lateral side of the ankle just below the lateral malleolus.  She does wear a brace which helps a little bit.  She finds it bothers her on a daily basis.  Assessment & Plan: Visit Diagnoses:  1. Pain in left ankle and joints of left foot     Plan: I reviewed the MRI with her today.  She has tendinosis of the peroneus brevis just distal to the lateral malleolus with a large longitudinal split tear she does not have any chondral defects within the ankle itself.  Her exam certainly coordinates with tendinosis and a tear of the peroneal tendon.  She is active and healthy.  I discussed her with Dr. [name] for possible discussion of conservative versus peroneal tendon repair.  She will follow-up with him to discuss various treatment.  I did briefly review surgical options in the recovery  Follow-Up Instructions: With Dr. Waldron Labs Exam  Patient is alert, oriented, no adenopathy, well-dressed, normal affect, normal respiratory effort. Examination of her left ankle she has a strong dorsalis pedis pulse.  She has no tenderness medially.  No tenderness over the ATFL.  No tenderness in the midfoot.  She does have some recreation of the pain with resisted eversion.  She is  tender along the peroneal tendon especially just distal to the lateral malleolus  Imaging: No results found. No images are attached to the encounter.  Labs: Lab Results  Component Value Date   HGBA1C 6.0 02/27/2021   ESRSEDRATE 2 01/12/2021   CRP 1 01/12/2021     Lab Results  Component Value Date   ALBUMIN 4.7 07/30/2021   ALBUMIN 4.5 07/24/2021   ALBUMIN 4.5 07/02/2021    No results found for: "MG" No results found for: "VD25OH"  No results found for: "PREALBUMIN"    Latest Ref Rng & Units 07/30/2021    5:02 PM 07/24/2021    8:00 AM 07/02/2021    8:28 AM  CBC EXTENDED  WBC 4.0 - 10.5 K/uL 5.8  4.3  4.5   RBC 3.87 - 5.11 MIL/uL 4.69  4.87  4.80   Hemoglobin 12.0 - 15.0 g/dL 19.1  47.8  29.5   HCT 36.0 - 46.0 % 39.2  40.0  40.4   Platelets 150 - 400 K/uL 274  243  279.0   NEUT# 1.7 - 7.7 K/uL  2.2  2.4   Lymph# 0.7 - 4.0 K/uL  1.5  1.5      There is no height or weight on file to calculate BMI.  Orders:  No orders of the defined types were placed in this encounter.  No orders of the defined types were placed in this encounter.    Procedures: No procedures performed  Clinical Data: No additional findings.  ROS:  All other systems negative, except as noted in the HPI. Review of Systems  All other systems reviewed and are negative.   Objective: Vital Signs: There were no vitals taken for this visit.  Specialty Comments:  No specialty comments available.  PMFS History: Patient Active Problem List   Diagnosis Date Noted   Unilateral primary osteoarthritis, right knee 01/19/2022   Pain in left ankle and joints of left foot 01/19/2022   Acute medial meniscus tear of right knee 12/15/2021   Menopausal state 11/27/2021   Cystic disease of liver 07/08/2021   Mesenteric cyst 07/08/2021   History of kidney stones 07/02/2021   Arthritis of ankle or foot, degenerative, right 03/16/2021   Prediabetes 02/27/2021   Chronic rhinitis 02/26/2021    Gastroesophageal reflux disease 02/26/2021   DUB (dysfunctional uterine bleeding) 11/28/2020   Facet arthropathy, lumbar 11/28/2020   Mild intermittent asthma, uncomplicated 11/28/2020   RLS (restless legs syndrome) 11/28/2020   SK (seborrheic keratosis) 11/28/2020   Vitamin D insufficiency 11/28/2020   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, 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   Mixed hyperlipidemia 02/04/2011   Past Medical History:  Diagnosis Date   Angio-edema    Asthma    Recurrent upper respiratory infection (URI)    Urticaria     Family History  Problem Relation Age of Onset   Hypertension Mother    Hyperlipidemia Mother    Retinitis pigmentosa Mother    Heart disease Father    Diabetes Father    Prostate cancer Father    Cancer - Prostate Father    Diabetes Brother    Heart attack Maternal Grandmother    Stroke Maternal Grandfather    Heart attack Maternal Grandfather    Heart disease Paternal Grandmother    Epilepsy Paternal Grandmother    Diabetes Paternal Grandfather    Retinitis pigmentosa Maternal Aunt    Allergic rhinitis Neg Hx    Asthma Neg Hx    Eczema Neg Hx    Urticaria Neg Hx    Colon cancer Neg Hx    Rectal cancer Neg Hx    Stomach cancer Neg Hx    Pancreatic cancer Neg Hx    Liver cancer Neg Hx     Past Surgical History:  Procedure Laterality Date   HERNIA REPAIR Bilateral    knee replacement Left 06/2019   LAPAROSCOPIC LYSIS OF ADHESIONS     TUBAL LIGATION     Social History   Occupational History   Occupation: Consulting civil engineer  Tobacco Use   Smoking status: Never   Smokeless tobacco: Never  Vaping Use   Vaping Use: Never used  Substance and Sexual Activity   Alcohol use: Yes    Comment: socially   Drug use: Never   Sexual activity: Yes

## 2022-02-09 NOTE — Progress Notes (Signed)
Office Visit Note   Patient: Theresa Gonzalez           Date of Birth: 27-Jul-1965           MRN: [id number] Visit Date: 02/09/2022              Requested by: Loyola Mast, MD [address] [place],  [place] [address] PCP: Loyola Mast, MD      HPI: Patient is a pleasant 56 year old woman with a history of a left ankle pain times a few months after being on some uneven ground at a camp.  She is now been having ongoing difficulties with this ankle.  She does use a brace which helps her a little bit.  Pain is swelling is focused on the lateral ankle.  At her last visit an MRI of her ankle was ordered by Dr. Cleophas Dunker and she is here today to review it with me  Assessment & Plan: Visit Diagnoses:  1. Pain in left ankle and joints of left foot     Plan: MRI reviewed today she has focal severe tendinosis of the peroneus brevis just distal to the lateral malleolus with a large longitudinal split tear.  This certainly coordinates to the area of her pain.  I have discussed her case with Dr. [name].  We will refer her to see him discussed conservative versus surgical options.  Briefly discussed surgical management and recovery  Follow-Up Instructions: After MRI with Dr. Waldron Labs Exam  Patient is alert, oriented, no adenopathy, well-dressed, normal affect, normal respiratory effort. Examination of her left ankle no redness she has easily palpable dorsalis pedis pulses.  Fairly good stability on anterior draw no medial pain.  No pain in the midfoot no pain over the ankle joint itself.  She does have pain along the peroneal tendon especially just ear inferior to the lateral malleolus.  She does have pain with resisted eversion no pain really with resisted dorsiflexion and plantarflexion  Imaging: No results found. No images are attached to the encounter.  Labs: Lab Results  Component Value Date   HGBA1C 6.0 02/27/2021   ESRSEDRATE 2 01/12/2021   CRP 1 01/12/2021     Lab  Results  Component Value Date   ALBUMIN 4.7 07/30/2021   ALBUMIN 4.5 07/24/2021   ALBUMIN 4.5 07/02/2021    No results found for: "MG" No results found for: "VD25OH"  No results found for: "PREALBUMIN"    Latest Ref Rng & Units 07/30/2021    5:02 PM 07/24/2021    8:00 AM 07/02/2021    8:28 AM  CBC EXTENDED  WBC 4.0 - 10.5 K/uL 5.8  4.3  4.5   RBC 3.87 - 5.11 MIL/uL 4.69  4.87  4.80   Hemoglobin 12.0 - 15.0 g/dL 02.5  42.7  06.2   HCT 36.0 - 46.0 % 39.2  40.0  40.4   Platelets 150 - 400 K/uL 274  243  279.0   NEUT# 1.7 - 7.7 K/uL  2.2  2.4   Lymph# 0.7 - 4.0 K/uL  1.5  1.5      There is no height or weight on file to calculate BMI.  Orders:  No orders of the defined types were placed in this encounter.  No orders of the defined types were placed in this encounter.    Procedures: No procedures performed  Clinical Data: No additional findings.  ROS:  All other systems negative, except as noted in the HPI. Review of  Systems  Objective: Vital Signs: There were no vitals taken for this visit.  Specialty Comments:  No specialty comments available.  PMFS History: Patient Active Problem List   Diagnosis Date Noted   Unilateral primary osteoarthritis, right knee 01/19/2022   Pain in left ankle and joints of left foot 01/19/2022   Acute medial meniscus tear of right knee 12/15/2021   Menopausal state 11/27/2021   Cystic disease of liver 07/08/2021   Mesenteric cyst 07/08/2021   History of kidney stones 07/02/2021   Arthritis of ankle or foot, degenerative, right 03/16/2021   Prediabetes 02/27/2021   Chronic rhinitis 02/26/2021   Gastroesophageal reflux disease 02/26/2021   DUB (dysfunctional uterine bleeding) 11/28/2020   Facet arthropathy, lumbar 11/28/2020   Mild intermittent asthma, uncomplicated 11/28/2020   RLS (restless legs syndrome) 11/28/2020   SK (seborrheic keratosis) 11/28/2020   Vitamin D insufficiency 11/28/2020   Status post total prosthetic  replacement of knee joint using cement, 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   Mixed hyperlipidemia 02/04/2011   Past Medical History:  Diagnosis Date   Angio-edema    Asthma    Recurrent upper respiratory infection (URI)    Urticaria     Family History  Problem Relation Age of Onset   Hypertension Mother    Hyperlipidemia Mother    Retinitis pigmentosa Mother    Heart disease Father    Diabetes Father    Prostate cancer Father    Cancer - Prostate Father    Diabetes Brother    Heart attack Maternal Grandmother    Stroke Maternal Grandfather    Heart attack Maternal Grandfather    Heart disease Paternal Grandmother    Epilepsy Paternal Grandmother    Diabetes Paternal Grandfather    Retinitis pigmentosa Maternal Aunt    Allergic rhinitis Neg Hx    Asthma Neg Hx    Eczema Neg Hx    Urticaria Neg Hx    Colon cancer Neg Hx    Rectal cancer Neg Hx    Stomach cancer Neg Hx    Pancreatic cancer Neg Hx    Liver cancer Neg Hx     Past Surgical History:  Procedure Laterality Date   HERNIA REPAIR Bilateral    knee replacement Left 06/2019   LAPAROSCOPIC LYSIS OF ADHESIONS     TUBAL LIGATION     Social History   Occupational History   Occupation: Consulting civil engineer  Tobacco Use   Smoking status: Never   Smokeless tobacco: Never  Vaping Use   Vaping Use: Never used  Substance and Sexual Activity   Alcohol use: Yes    Comment: socially   Drug use: Never   Sexual activity: Yes

## 2022-02-10 ENCOUNTER — Telehealth: Payer: Self-pay | Admitting: Physician Assistant

## 2022-02-10 NOTE — Telephone Encounter (Signed)
Patient called in to ask if she needed to stay off her ankle until she seen [name] in 2 weeks or continue to walk on it

## 2022-02-15 ENCOUNTER — Encounter: Payer: Self-pay | Admitting: Gastroenterology

## 2022-02-22 ENCOUNTER — Encounter: Payer: Self-pay | Admitting: Gastroenterology

## 2022-02-23 ENCOUNTER — Encounter: Payer: Self-pay | Admitting: Orthopedic Surgery

## 2022-02-23 ENCOUNTER — Ambulatory Visit (INDEPENDENT_AMBULATORY_CARE_PROVIDER_SITE_OTHER): Payer: BC Managed Care – PPO | Admitting: Orthopedic Surgery

## 2022-02-23 DIAGNOSIS — M7672 Peroneal tendinitis, left leg: Secondary | ICD-10-CM

## 2022-02-23 DIAGNOSIS — M25572 Pain in left ankle and joints of left foot: Secondary | ICD-10-CM | POA: Diagnosis not present

## 2022-02-23 NOTE — Progress Notes (Signed)
Office Visit Note   Patient: Theresa Gonzalez           Date of Birth: 1965/10/12           MRN: [id number] Visit Date: 02/23/2022              Requested by: Loyola Mast, MD [address] [place],  [place] [address] PCP: Loyola Mast, MD  Chief Complaint  Patient presents with   Left Ankle - Follow-up      HPI: Patient is a 56 year old woman who is seen in referral from Dr. Cleophas Dunker.  Patient has had an MRI scan of her left ankle she has had chronic pain over the peroneal tendons with swelling.  Patient states she has been symptomatic for over 2 months has used a brace anti-inflammatories without relief.  Assessment & Plan: Visit Diagnoses:  1. Pain in left ankle and joints of left foot   2. Tendonitis of peroneus brevis tendon, left     Plan: Discussed conservative versus surgical intervention.  Surgical intervention with include debridement of the peroneus brevis and reinforcement with the peroneus longus tendon.  Risk and benefits were discussed including infection neurovascular injury nonhealing of the tendon.  Patient states she understands wished to proceed with surgery discussed that she would be off her foot for 2 weeks and then the fracture boot for 2 weeks.  Follow-Up Instructions: Return if symptoms worsen or fail to improve.   Ortho Exam  Patient is alert, oriented, no adenopathy, well-dressed, normal affect, normal respiratory effort. Examination patient has a good dorsalis pedis pulse.  She has pain with resisted eversion no pain with resisted inversion.  Posterior tibial tendon is not tender to palpation anterior joint is not tender to palpation she is point tender to palpation and has swelling over the peroneal tendons distal to the fibula.  Review of the MRI scan to the tendinosis of the peroneus brevis just distal to the lateral malleolus.  Imaging: No results found. No images are attached to the encounter.  Labs: Lab Results  Component Value  Date   HGBA1C 6.0 02/27/2021   ESRSEDRATE 2 01/12/2021   CRP 1 01/12/2021     Lab Results  Component Value Date   ALBUMIN 4.7 07/30/2021   ALBUMIN 4.5 07/24/2021   ALBUMIN 4.5 07/02/2021    No results found for: "MG" No results found for: "VD25OH"  No results found for: "PREALBUMIN"    Latest Ref Rng & Units 07/30/2021    5:02 PM 07/24/2021    8:00 AM 07/02/2021    8:28 AM  CBC EXTENDED  WBC 4.0 - 10.5 K/uL 5.8  4.3  4.5   RBC 3.87 - 5.11 MIL/uL 4.69  4.87  4.80   Hemoglobin 12.0 - 15.0 g/dL 66.0  63.0  16.0   HCT 36.0 - 46.0 % 39.2  40.0  40.4   Platelets 150 - 400 K/uL 274  243  279.0   NEUT# 1.7 - 7.7 K/uL  2.2  2.4   Lymph# 0.7 - 4.0 K/uL  1.5  1.5      There is no height or weight on file to calculate BMI.  Orders:  No orders of the defined types were placed in this encounter.  No orders of the defined types were placed in this encounter.    Procedures: No procedures performed  Clinical Data: No additional findings.  ROS:  All other systems negative, except as noted in the HPI. Review of Systems  Objective: Vital Signs: There were no vitals taken for this visit.  Specialty Comments:  No specialty comments available.  PMFS History: Patient Active Problem List   Diagnosis Date Noted   Unilateral primary osteoarthritis, right knee 01/19/2022   Pain in left ankle and joints of left foot 01/19/2022   Acute medial meniscus tear of right knee 12/15/2021   Menopausal state 11/27/2021   Cystic disease of liver 07/08/2021   Mesenteric cyst 07/08/2021   History of kidney stones 07/02/2021   Arthritis of ankle or foot, degenerative, right 03/16/2021   Prediabetes 02/27/2021   Chronic rhinitis 02/26/2021   Gastroesophageal reflux disease 02/26/2021   DUB (dysfunctional uterine bleeding) 11/28/2020   Facet arthropathy, lumbar 11/28/2020   Mild intermittent asthma, uncomplicated 11/28/2020   RLS (restless legs syndrome) 11/28/2020   SK (seborrheic  keratosis) 11/28/2020   Vitamin D insufficiency 11/28/2020   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, 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   Mixed hyperlipidemia 02/04/2011   Past Medical History:  Diagnosis Date   Angio-edema    Asthma    Recurrent upper respiratory infection (URI)    Urticaria     Family History  Problem Relation Age of Onset   Hypertension Mother    Hyperlipidemia Mother    Retinitis pigmentosa Mother    Heart disease Father    Diabetes Father    Prostate cancer Father    Cancer - Prostate Father    Diabetes Brother    Heart attack Maternal Grandmother    Stroke Maternal Grandfather    Heart attack Maternal Grandfather    Heart disease Paternal Grandmother    Epilepsy Paternal Grandmother    Diabetes Paternal Grandfather    Retinitis pigmentosa Maternal Aunt    Allergic rhinitis Neg Hx    Asthma Neg Hx    Eczema Neg Hx    Urticaria Neg Hx    Colon cancer Neg Hx    Rectal cancer Neg Hx    Stomach cancer Neg Hx    Pancreatic cancer Neg Hx    Liver cancer Neg Hx     Past Surgical History:  Procedure Laterality Date   HERNIA REPAIR Bilateral    knee replacement Left 06/2019   LAPAROSCOPIC LYSIS OF ADHESIONS     TUBAL LIGATION     Social History   Occupational History   Occupation: Consulting civil engineer  Tobacco Use   Smoking status: Never   Smokeless tobacco: Never  Vaping Use   Vaping Use: Never used  Substance and Sexual Activity   Alcohol use: Yes    Comment: socially   Drug use: Never   Sexual activity: Yes

## 2022-02-24 DIAGNOSIS — N951 Menopausal and female climacteric states: Secondary | ICD-10-CM | POA: Diagnosis not present

## 2022-02-24 DIAGNOSIS — G4700 Insomnia, unspecified: Secondary | ICD-10-CM | POA: Diagnosis not present

## 2022-02-24 DIAGNOSIS — G479 Sleep disorder, unspecified: Secondary | ICD-10-CM | POA: Diagnosis not present

## 2022-02-25 ENCOUNTER — Telehealth: Payer: Self-pay | Admitting: Gastroenterology

## 2022-02-25 NOTE — Telephone Encounter (Signed)
PT has an MRI scheduled for 03/15/2022. Unfortunately her insurance has approved it only if done before 9/9. She wants to know can she reschedule to anytime before the 8/31 or can she have it done else where such as [facility]. Please advise.

## 2022-02-26 NOTE — Telephone Encounter (Signed)
Pt rescheduled for MRI abd/pelvis on 03/05/22 at 9 am at [facility]. Pt verbalized understanding.  

## 2022-02-26 NOTE — Telephone Encounter (Signed)
Pt rescheduled for MRI abd/pelvis on 03/05/22 at 9 am at [facility]. Pt verbalized understanding.

## 2022-03-03 NOTE — Telephone Encounter (Signed)
Patient called back stated she needs to reschedule her MRI appointment due to insurance purposes. Please call to advise.

## 2022-03-04 NOTE — Telephone Encounter (Signed)
Secure chat sent to [facility] for authorization questions. Joey stated MRI now expires on 9/29 and has been approved for [facility] imaging. Called [facility] and they stated they would contact pt for screening questions and to schedule MRI. Called pt to let her know and pt verbalized understanding.

## 2022-03-05 ENCOUNTER — Other Ambulatory Visit (HOSPITAL_BASED_OUTPATIENT_CLINIC_OR_DEPARTMENT_OTHER): Payer: BC Managed Care – PPO

## 2022-03-05 ENCOUNTER — Ambulatory Visit (HOSPITAL_BASED_OUTPATIENT_CLINIC_OR_DEPARTMENT_OTHER): Payer: BC Managed Care – PPO

## 2022-03-10 ENCOUNTER — Other Ambulatory Visit: Payer: Self-pay

## 2022-03-10 ENCOUNTER — Encounter (HOSPITAL_COMMUNITY): Payer: Self-pay | Admitting: Orthopedic Surgery

## 2022-03-10 NOTE — Progress Notes (Signed)
PCP - Dr. Timmie Foerster  Cardiologist - Denies  EP- Denies  Endocrine- Denies  Pulm- Denies  Chest x-ray - 1/26/223 (E)  EKG - Denies  Stress Test - Denies  ECHO - Denies  Cardiac Cath - Denies  AICD-na PM-na LOOP-na  Nerve Stimulator- Denies  Dialysis- Denies  Sleep Study - Denies CPAP - Denies  LABS- 03/12/22: CBC  ASA- Denies  ERAS- Yes- clears until 0430  HA1C- Denies  Anesthesia- No  Pt denies having chest pain, sob, or fever during the pre-op phone call. All instructions explained to the pt, with a verbal understanding of the material including: as of today, stop taking all Aspirin (unless instructed by your doctor) and Other Aspirin containing products, Vitamins, Fish oils, and Herbal medications. Also stop all NSAIDS i.e. Advil, Ibuprofen, Motrin, Aleve, Anaprox, Naproxen, BC, Goody Powders, and all Supplements. Pt also instructed to wear a mask and social distance if she goes out. The opportunity to ask questions was provided.

## 2022-03-11 NOTE — Anesthesia Preprocedure Evaluation (Addendum)
Anesthesia Evaluation  Patient identified by MRN, date of birth, ID band Patient awake    Reviewed: Allergy & Precautions, NPO status , Patient's Chart, lab work & pertinent test results  History of Anesthesia Complications (+) PONV and history of anesthetic complications  Airway Mallampati: II  TM Distance: >3 FB Neck ROM: Full    Dental no notable dental hx.    Pulmonary asthma    Pulmonary exam normal breath sounds clear to auscultation       Cardiovascular Exercise Tolerance: Good Normal cardiovascular exam Rhythm:Regular Rate:Normal     Neuro/Psych negative neurological ROS  negative psych ROS   GI/Hepatic ,GERD  ,,  Endo/Other    Renal/GU      Musculoskeletal  (+) Arthritis ,    Abdominal   Peds  Hematology   Anesthesia Other Findings All: PCN, ropinirol,e Nabumetone  Reproductive/Obstetrics                             Anesthesia Physical Anesthesia Plan  ASA: 2  Anesthesia Plan: General   Post-op Pain Management: Dilaudid IV and Tylenol PO (pre-op)*   Induction: Intravenous  PONV Risk Score and Plan: 4 or greater and Treatment may vary due to age or medical condition, Midazolam, Ondansetron and Dexamethasone  Airway Management Planned: LMA  Additional Equipment: None  Intra-op Plan:   Post-operative Plan: Extubation in OR  Informed Consent: I have reviewed the patients History and Physical, chart, labs and discussed the procedure including the risks, benefits and alternatives for the proposed anesthesia with the patient or authorized representative who has indicated his/her understanding and acceptance.     Dental advisory given  Plan Discussed with:   Anesthesia Plan Comments:         Anesthesia Quick Evaluation

## 2022-03-12 ENCOUNTER — Ambulatory Visit (HOSPITAL_COMMUNITY): Payer: BC Managed Care – PPO | Admitting: Anesthesiology

## 2022-03-12 ENCOUNTER — Encounter (HOSPITAL_COMMUNITY): Payer: Self-pay | Admitting: Orthopedic Surgery

## 2022-03-12 ENCOUNTER — Other Ambulatory Visit: Payer: Self-pay

## 2022-03-12 ENCOUNTER — Encounter (HOSPITAL_COMMUNITY)
Admission: RE | Disposition: A | Discharge status: Home / Self Care | Payer: Self-pay | Source: Home / Self Care | Attending: Orthopedic Surgery

## 2022-03-12 ENCOUNTER — Ambulatory Visit (HOSPITAL_COMMUNITY)
Admission: RE | Admit: 2022-03-12 | Discharge: 2022-03-12 | Disposition: A | Discharge status: Home / Self Care | Payer: BC Managed Care – PPO | Attending: Orthopedic Surgery | Admitting: Orthopedic Surgery

## 2022-03-12 DIAGNOSIS — K219 Gastro-esophageal reflux disease without esophagitis: Secondary | ICD-10-CM | POA: Diagnosis not present

## 2022-03-12 DIAGNOSIS — M7672 Peroneal tendinitis, left leg: Secondary | ICD-10-CM

## 2022-03-12 DIAGNOSIS — S86312A Strain of muscle(s) and tendon(s) of peroneal muscle group at lower leg level, left leg, initial encounter: Secondary | ICD-10-CM | POA: Insufficient documentation

## 2022-03-12 DIAGNOSIS — X58XXXA Exposure to other specified factors, initial encounter: Secondary | ICD-10-CM | POA: Diagnosis not present

## 2022-03-12 HISTORY — DX: Prediabetes: R73.03

## 2022-03-12 HISTORY — DX: Pneumonia, unspecified organism: J18.9

## 2022-03-12 HISTORY — DX: Other specified postprocedural states: Z98.890

## 2022-03-12 HISTORY — DX: Other specified postprocedural states: R11.2

## 2022-03-12 LAB — CBC
HCT: 41.6 % (ref 36.0–46.0)
Hemoglobin: 13.9 g/dL (ref 12.0–15.0)
MCH: 28.9 pg (ref 26.0–34.0)
MCHC: 33.4 g/dL (ref 30.0–36.0)
MCV: 86.5 fL (ref 80.0–100.0)
Platelets: 256 10*3/uL (ref 150–400)
RBC: 4.81 MIL/uL (ref 3.87–5.11)
RDW: 13.6 % (ref 11.5–15.5)
WBC: 5.1 10*3/uL (ref 4.0–10.5)
nRBC: 0 % (ref 0.0–0.2)

## 2022-03-12 SURGERY — TRANSFER, TENDON, FLEXOR DIGITORUM LONGUS TO NAVICULAR BONE OF FOOT
Anesthesia: General | Site: Foot | Laterality: Left

## 2022-03-12 MED ORDER — FENTANYL CITRATE (PF) 250 MCG/5ML IJ SOLN
INTRAMUSCULAR | Status: DC | PRN
  Administered 2022-03-12 (×2): 50 ug via INTRAVENOUS

## 2022-03-12 MED ORDER — HYDROMORPHONE HCL 1 MG/ML IJ SOLN
INTRAMUSCULAR | Status: AC
  Filled 2022-03-12: qty 1

## 2022-03-12 MED ORDER — SODIUM CHLORIDE 0.9 % IV SOLN: INTRAVENOUS | Status: DC

## 2022-03-12 MED ORDER — DEXAMETHASONE SODIUM PHOSPHATE 10 MG/ML IJ SOLN
INTRAMUSCULAR | Status: DC | PRN
  Administered 2022-03-12: 10 mg via INTRAVENOUS

## 2022-03-12 MED ORDER — ONDANSETRON HCL 4 MG/2ML IJ SOLN
INTRAMUSCULAR | Status: DC | PRN
  Administered 2022-03-12: 4 mg via INTRAVENOUS

## 2022-03-12 MED ORDER — OXYCODONE HCL 5 MG PO TABS: 5.0000 mg | ORAL_TABLET | Freq: Once | ORAL | Status: DC | PRN

## 2022-03-12 MED ORDER — EPHEDRINE SULFATE-NACL 50-0.9 MG/10ML-% IV SOSY
PREFILLED_SYRINGE | INTRAVENOUS | Status: DC | PRN
  Administered 2022-03-12 (×5): 5 mg via INTRAVENOUS

## 2022-03-12 MED ORDER — OXYCODONE HCL 5 MG/5ML PO SOLN: 5.0000 mg | Freq: Once | ORAL | Status: DC | PRN

## 2022-03-12 MED ORDER — PROPOFOL 10 MG/ML IV BOLUS
INTRAVENOUS | Status: AC
  Filled 2022-03-12: qty 20

## 2022-03-12 MED ORDER — 0.9 % SODIUM CHLORIDE (POUR BTL) OPTIME
TOPICAL | Status: DC | PRN
  Administered 2022-03-12: 1000 mL

## 2022-03-12 MED ORDER — MIDAZOLAM HCL 2 MG/2ML IJ SOLN
INTRAMUSCULAR | Status: DC | PRN
  Administered 2022-03-12: 2 mg via INTRAVENOUS

## 2022-03-12 MED ORDER — MIDAZOLAM HCL 2 MG/2ML IJ SOLN
INTRAMUSCULAR | Status: AC
  Filled 2022-03-12: qty 2

## 2022-03-12 MED ORDER — PROPOFOL 10 MG/ML IV BOLUS
INTRAVENOUS | Status: DC | PRN
  Administered 2022-03-12: 160 mg via INTRAVENOUS

## 2022-03-12 MED ORDER — LIDOCAINE 2% (20 MG/ML) 5 ML SYRINGE
INTRAMUSCULAR | Status: DC | PRN
  Administered 2022-03-12: 100 mg via INTRAVENOUS

## 2022-03-12 MED ORDER — HYDROCODONE-ACETAMINOPHEN 5-325 MG PO TABS: 1.0000 | ORAL_TABLET | ORAL | 0 refills | Status: DC | PRN

## 2022-03-12 MED ORDER — ORAL CARE MOUTH RINSE
15.0000 mL | Freq: Once | OROMUCOSAL | Status: AC
Start: 2022-03-12 — End: 2022-03-12

## 2022-03-12 MED ORDER — CHLORHEXIDINE GLUCONATE 0.12 % MT SOLN
15.0000 mL | Freq: Once | OROMUCOSAL | Status: AC
  Administered 2022-03-12: 15 mL via OROMUCOSAL
  Filled 2022-03-12: qty 15

## 2022-03-12 MED ORDER — ONDANSETRON HCL 4 MG/2ML IJ SOLN: 4.0000 mg | Freq: Once | INTRAMUSCULAR | Status: DC | PRN

## 2022-03-12 MED ORDER — FENTANYL CITRATE (PF) 250 MCG/5ML IJ SOLN
INTRAMUSCULAR | Status: AC
  Filled 2022-03-12: qty 5

## 2022-03-12 MED ORDER — LACTATED RINGERS IV SOLN: INTRAVENOUS | Status: DC | PRN

## 2022-03-12 MED ORDER — BUPIVACAINE HCL (PF) 0.25 % IJ SOLN
INTRAMUSCULAR | Status: AC
Start: 2022-03-12 — End: ?
  Filled 2022-03-12: qty 30

## 2022-03-12 MED ORDER — HYDROMORPHONE HCL 1 MG/ML IJ SOLN
0.2500 mg | INTRAMUSCULAR | Status: DC | PRN
  Administered 2022-03-12 (×3): 0.5 mg via INTRAVENOUS

## 2022-03-12 MED ORDER — ACETAMINOPHEN 10 MG/ML IV SOLN
1000.0000 mg | Freq: Once | INTRAVENOUS | Status: DC | PRN
  Administered 2022-03-12: 1000 mg via INTRAVENOUS

## 2022-03-12 MED ORDER — CEFAZOLIN SODIUM-DEXTROSE 2-4 GM/100ML-% IV SOLN
2.0000 g | INTRAVENOUS | Status: AC
  Administered 2022-03-12: 2 g via INTRAVENOUS
  Filled 2022-03-12: qty 100

## 2022-03-12 MED ORDER — ACETAMINOPHEN 10 MG/ML IV SOLN
INTRAVENOUS | Status: AC
  Filled 2022-03-12: qty 100

## 2022-03-12 SURGICAL SUPPLY — 32 items
BAG COUNTER SPONGE SURGICOUNT (BAG) ×1 IMPLANT
BNDG COHESIVE 6X5 TAN STRL LF (GAUZE/BANDAGES/DRESSINGS) ×1 IMPLANT
BNDG ELASTIC 4X5.8 VLCR STR LF (GAUZE/BANDAGES/DRESSINGS) IMPLANT
BNDG GAUZE DERMACEA FLUFF 4 (GAUZE/BANDAGES/DRESSINGS) IMPLANT
COVER MAYO STAND STRL (DRAPES) ×1 IMPLANT
COVER SURGICAL LIGHT HANDLE (MISCELLANEOUS) ×1 IMPLANT
CUFF TOURN SGL QUICK 34 (TOURNIQUET CUFF)
CUFF TOURN SGL QUICK 42 (TOURNIQUET CUFF) IMPLANT
CUFF TRNQT CYL 34X4.125X (TOURNIQUET CUFF) IMPLANT
DRAPE U-SHAPE 47X51 STRL (DRAPES) ×1 IMPLANT
DRSG ADAPTIC 3X8 NADH LF (GAUZE/BANDAGES/DRESSINGS) IMPLANT
DRSG EMULSION OIL 3X3 NADH (GAUZE/BANDAGES/DRESSINGS) ×1 IMPLANT
DURAPREP 26ML APPLICATOR (WOUND CARE) ×1 IMPLANT
ELECT REM PT RETURN 9FT ADLT (ELECTROSURGICAL) ×1
ELECTRODE REM PT RTRN 9FT ADLT (ELECTROSURGICAL) ×1 IMPLANT
GAUZE SPONGE 4X4 12PLY STRL (GAUZE/BANDAGES/DRESSINGS) ×1 IMPLANT
GLOVE BIOGEL PI IND STRL 9 (GLOVE) ×1 IMPLANT
GLOVE SURG ORTHO 9.0 STRL STRW (GLOVE) ×1 IMPLANT
GOWN STRL REUS W/ TWL XL LVL3 (GOWN DISPOSABLE) ×2 IMPLANT
GOWN STRL REUS W/TWL XL LVL3 (GOWN DISPOSABLE) ×2
KIT BASIN OR (CUSTOM PROCEDURE TRAY) ×1 IMPLANT
KIT TURNOVER KIT B (KITS) ×1 IMPLANT
PACK ORTHO EXTREMITY (CUSTOM PROCEDURE TRAY) ×1 IMPLANT
PAD ARMBOARD 7.5X6 YLW CONV (MISCELLANEOUS) ×2 IMPLANT
PADDING CAST ABS COTTON 4X4 ST (CAST SUPPLIES) ×1 IMPLANT
SUT ETHILON 2 0 PSLX (SUTURE) IMPLANT
SUT VIC AB 2-0 CT1 27 (SUTURE) ×5
SUT VIC AB 2-0 CT1 TAPERPNT 27 (SUTURE) IMPLANT
TUBE CONNECTING 12X1/4 (SUCTIONS) IMPLANT
UNDERPAD 30X36 HEAVY ABSORB (UNDERPADS AND DIAPERS) ×1 IMPLANT
WATER STERILE IRR 1000ML POUR (IV SOLUTION) ×1 IMPLANT
YANKAUER SUCT BULB TIP NO VENT (SUCTIONS) IMPLANT

## 2022-03-12 NOTE — Op Note (Signed)
03/12/2022  8:20 AM  PATIENT:  Theresa Gonzalez    PRE-OPERATIVE DIAGNOSIS:  Tear Left Peroneal Brevis  POST-OPERATIVE DIAGNOSIS:  Same  PROCEDURE:  DEBRIDEMENT LEFT PERONEAL BREVIS AND REINFORCE WITH PERONEAL LONGUS  SURGEON:  Nadara Mustard, MD  PHYSICIAN ASSISTANT:None ANESTHESIA:   General  PREOPERATIVE INDICATIONS:  Theresa Gonzalez is a  56 y.o. female with a diagnosis of Tear Left Peroneal Brevis who failed conservative measures and elected for surgical management.    The risks benefits and alternatives were discussed with the patient preoperatively including but not limited to the risks of infection, bleeding, nerve injury, cardiopulmonary complications, the need for revision surgery, among others, and the patient was willing to proceed.  OPERATIVE IMPLANTS: None  @ENCIMAGES @  OPERATIVE FINDINGS: Patient had essentially three quarters of the peroneus brevis with large cystic degenerative changes.  OPERATIVE PROCEDURE: Patient was brought the operating room and underwent a general anesthetic.  After adequate levels anesthesia were obtained patient's left lower extremity was prepped using DuraPrep draped into a sterile field a timeout was called.  An incision was made laterally over the peroneal tendons.  This was carried down to the peroneal retinaculum which was incised along the sheath.  The peroneus brevis was delivered to the field and there was a large cystic degenerative changes of the peroneus brevis.  This required resection approximately three quarters of the tendon.  The remainder of the healthy tendon was tubularized with 2-0 Vicryl.  This was then reinforced with the peroneus longus.  The peroneus longus was pulled cut and using a locking suture technique this was secured with the foot everted to the distal aspect of the healthy peroneus brevis.  These 2 tendons were then tubularized together.  These were placed in the the fibular groove and the retinaculum was closed using 2-0  Vicryl.  There was good excursion of the tendons within the sheath.  The skin was then closed using 2-0 nylon a sterile dressing was applied patient was extubated taken the PACU in stable condition.   DISCHARGE PLANNING:  Antibiotic duration: Preoperative antibiotics  Weightbearing: Nonweightbearing on the left  Pain medication: Prescription for Vicodin  Dressing care/ Wound VAC: Dry dressing  Ambulatory devices: Crutches  Discharge to: Home.  Follow-up: In the office 1 week post operative.

## 2022-03-12 NOTE — Anesthesia Procedure Notes (Signed)
Procedure Name: LMA Insertion Date/Time: 03/12/2022 7:49 AM  Performed by: Cheree Ditto, CRNAPre-anesthesia Checklist: Patient identified, Emergency Drugs available, Suction available and Patient being monitored Patient Re-evaluated:Patient Re-evaluated prior to induction Oxygen Delivery Method: Circle system utilized Preoxygenation: Pre-oxygenation with 100% oxygen Induction Type: IV induction Ventilation: Mask ventilation without difficulty LMA: LMA inserted LMA Size: 4.0 Number of attempts: 1 Placement Confirmation: breath sounds checked- equal and bilateral and positive ETCO2 Tube secured with: Tape

## 2022-03-12 NOTE — Anesthesia Postprocedure Evaluation (Signed)
Anesthesia Post Note  Patient: Theresa Gonzalez  Procedure(s) Performed: DEBRIDEMENT LEFT PERONEAL BREVIS AND REINFORCE WITH PERONEAL LONGUS (Left: Foot)     Patient location during evaluation: PACU Anesthesia Type: General Level of consciousness: awake and alert Pain management: pain level controlled Vital Signs Assessment: post-procedure vital signs reviewed and stable Respiratory status: spontaneous breathing, nonlabored ventilation, respiratory function stable and patient connected to nasal cannula oxygen Cardiovascular status: blood pressure returned to baseline and stable Postop Assessment: no apparent nausea or vomiting Anesthetic complications: no   No notable events documented.  Last Vitals:  Vitals:   03/12/22 0900 03/12/22 0915  BP: 124/69 130/81  Pulse: 67 75  Resp: 10 14  Temp:  36.7 C  SpO2: 99% 97%    Last Pain:  Vitals:   03/12/22 0915  TempSrc:   PainSc: 2                  Trevor Iha

## 2022-03-12 NOTE — Transfer of Care (Signed)
Immediate Anesthesia Transfer of Care Note  Patient: Theresa Gonzalez  Procedure(s) Performed: DEBRIDEMENT LEFT PERONEAL BREVIS AND REINFORCE WITH PERONEAL LONGUS (Left: Foot)  Patient Location: PACU  Anesthesia Type:General  Level of Consciousness: awake, alert  and oriented  Airway & Oxygen Therapy: Patient Spontanous Breathing  Post-op Assessment: Report given to RN and Post -op Vital signs reviewed and stable  Post vital signs: Reviewed and stable  Last Vitals:  Vitals Value Taken Time  BP 123/67 03/12/22 0833  Temp    Pulse 97 03/12/22 0833  Resp 20 03/12/22 0833  SpO2 96 % 03/12/22 0833  Vitals shown include unvalidated device data.  Last Pain:  Vitals:   03/12/22 0623  TempSrc:   PainSc: 3       Patients Stated Pain Goal: 3 (03/10/22 1741)  Complications: No notable events documented.

## 2022-03-12 NOTE — H&P (Signed)
Theresa Gonzalez is an 56 y.o. female.   Chief Complaint: Lateral left ankle pain. HPI: Patient is a 56 year old woman who sustained a lateral left ankle injury on uneven terrain several months ago.  Patient has used bracing with minimal relief she has had pain and swelling lateral aspect left ankle.  Past Medical History:  Diagnosis Date   Angio-edema    Asthma    Pneumonia    PONV (postoperative nausea and vomiting)    Pre-diabetes    Recurrent upper respiratory infection (URI)    Urticaria     Past Surgical History:  Procedure Laterality Date   HERNIA REPAIR Bilateral    knee replacement Left 06/2019   LAPAROSCOPIC LYSIS OF ADHESIONS     TUBAL LIGATION      Family History  Problem Relation Age of Onset   Hypertension Mother    Hyperlipidemia Mother    Retinitis pigmentosa Mother    Heart disease Father    Diabetes Father    Prostate cancer Father    Cancer - Prostate Father    Diabetes Brother    Heart attack Maternal Grandmother    Stroke Maternal Grandfather    Heart attack Maternal Grandfather    Heart disease Paternal Grandmother    Epilepsy Paternal Grandmother    Diabetes Paternal Grandfather    Retinitis pigmentosa Maternal Aunt    Allergic rhinitis Neg Hx    Asthma Neg Hx    Eczema Neg Hx    Urticaria Neg Hx    Colon cancer Neg Hx    Rectal cancer Neg Hx    Stomach cancer Neg Hx    Pancreatic cancer Neg Hx    Liver cancer Neg Hx    Social History:  reports that she has never smoked. She has never used smokeless tobacco. She reports current alcohol use. She reports that she does not use drugs.  Allergies:  Allergies  Allergen Reactions   Penicillins Rash and Shortness Of Breath   Potassium Citrate Itching   Ropinirole Shortness Of Breath   Cetirizine Hcl Other (See Comments)    Blood Pressure dropped   Nabumetone Rash   Nsaids Rash    Provider: Valere Dross CFM - Allergy Description: NSAIDs CFM - Allergy Annotation: rash   Sodium Hyaluronate  Hives    Medications Prior to Admission  Medication Sig Dispense Refill   Berberine Chloride (BERBERINE HCI PO) Take 450 mg by mouth 2 (two) times daily.     Cyanocobalamin (VITAMIN B-12) 5000 MCG SUBL Place 5,000 mcg under the tongue daily.     estradiol (CLIMARA - DOSED IN MG/24 HR) 0.05 mg/24hr patch Place 0.5 mg onto the skin once a week.     fenofibrate (TRICOR) 145 MG tablet Take 1 tablet (145 mg total) by mouth daily. 90 tablet 3   MAGNESIUM GLUCONATE PO Take 120 mg by mouth daily.     Omega-3 Fatty Acids (OMEGA 3 PO) Take 770 mcg by mouth daily. Vegan     omeprazole (PRILOSEC) 20 MG capsule Take 20 mg by mouth daily.     OVER THE COUNTER MEDICATION Take 306 mg by mouth daily. Eastergen control     OVER THE COUNTER MEDICATION Take 1 capsule by mouth daily. Thyroid Support     OVER THE COUNTER MEDICATION Take 1 capsule by mouth daily. Mega D3 Mk7     pramipexole (MIRAPEX) 1 MG tablet Take 1 tablet (1 mg total) by mouth at bedtime. 90 tablet 3   Prasterone, DHEA, (DHEA  PO) Take 5 mg by mouth daily.     Pregnenolone Micronized (PREGNENOLONE PO) Take 75 mg by mouth daily.     Probiotic Product (PROBIOTIC DAILY) CAPS Take 1 capsule by mouth daily.     progesterone (PROMETRIUM) 100 MG capsule Take 300 mg by mouth at bedtime.     thiamine (VITAMIN B-1) 100 MG tablet Take 100 mg by mouth daily.     thyroid (ARMOUR) 30 MG tablet Take 30 mg by mouth every morning.     levocetirizine (XYZAL) 5 MG tablet Take 1 tablet (5 mg total) by mouth every evening. 30 tablet 3   methocarbamol (ROBAXIN) 500 MG tablet Take 1 tablet (500 mg total) by mouth 2 (two) times daily as needed for muscle spasms. (Patient not taking: Reported on 03/05/2022) 20 tablet 0   ondansetron (ZOFRAN) 4 MG tablet Take 1 tablet (4 mg total) by mouth every 8 (eight) hours as needed for nausea or vomiting. 20 tablet 0    Results for orders placed or performed during the hospital encounter of 03/12/22 (from the past 48 hour(s))   CBC per protocol     Status: None   Collection Time: 03/12/22  6:10 AM  Result Value Ref Range   WBC 5.1 4.0 - 10.5 K/uL   RBC 4.81 3.87 - 5.11 MIL/uL   Hemoglobin 13.9 12.0 - 15.0 g/dL   HCT 24.2 35.3 - 61.4 %   MCV 86.5 80.0 - 100.0 fL   MCH 28.9 26.0 - 34.0 pg   MCHC 33.4 30.0 - 36.0 g/dL   RDW 43.1 54.0 - 08.6 %   Platelets 256 150 - 400 K/uL   nRBC 0.0 0.0 - 0.2 %    Comment: Performed at [facility], [address], [place], [place] [address]   No results found.  Review of Systems  All other systems reviewed and are negative.   Blood pressure 132/73, pulse 72, temperature 98.5 F (36.9 C), temperature source Oral, resp. rate 17, height 5\' 3"  (1.6 m), weight 75.3 kg, SpO2 96 %. Physical Exam  Patient is alert oriented no adenopathy well-dressed normal affect normal respiratory effort.  She has a good dorsalis pedis pulse.  Patient has pain and swelling to palpation over the peroneal tendons.  She has pain with resisted eversion.  Review of the MRI scan shows some inflammation along the peroneus longus with longitudinal split tear of the peroneus brevis with mild tendinosis of the Achilles. Assessment/Plan Assessment: Longitudinal split tear of the peroneus brevis still symptomatic after conservative therapy.  Plan: We will plan for debridement of the peroneus brevis reinforcement with the peroneus longus.  Risks and benefits were discussed including infection neurovascular injury persistent pain need for additional surgery.  Patient states she understands wished to proceed at this time.  , MD 03/12/2022, 6:37 AM

## 2022-03-12 NOTE — Progress Notes (Signed)
Orthopedic Tech Progress Note Patient Details:  Theresa Gonzalez 1965-10-02 [id number]  Ortho Devices Type of Ortho Device: CAM walker Ortho Device/Splint Location: LLE Ortho Device/Splint Interventions: Ordered, Application, Adjustment   Post Interventions Patient Tolerated: Well Instructions Provided: Adjustment of device, Care of device  Georg Ruddle 03/12/2022, 9:13 AM

## 2022-03-15 ENCOUNTER — Other Ambulatory Visit (HOSPITAL_COMMUNITY): Payer: BC Managed Care – PPO

## 2022-03-19 ENCOUNTER — Ambulatory Visit (INDEPENDENT_AMBULATORY_CARE_PROVIDER_SITE_OTHER): Payer: BC Managed Care – PPO | Admitting: Family

## 2022-03-19 ENCOUNTER — Encounter: Payer: Self-pay | Admitting: Family

## 2022-03-19 DIAGNOSIS — M7672 Peroneal tendinitis, left leg: Secondary | ICD-10-CM

## 2022-03-19 NOTE — Progress Notes (Signed)
Post-Op Visit Note   Patient: Theresa Gonzalez           Date of Birth: 04-Sep-1965           MRN: [id number] Visit Date: 03/19/2022 PCP: Loyola Mast, MD  Chief Complaint:  Chief Complaint  Patient presents with   Left Foot - Routine Post Op    03/12/2022 deb left peroneal brevis and reinforce longus     HPI:  HPI The patient is a 56 year old woman who is seen status post debridement of the left peroneal brevis with reinforcement with the peroneus longus.  She has been nonweightbearing she is 1 week postop. Ortho Exam On examination of the left foot incisions well approximated sutures there is no gaping,  drainage no erythema  Visit Diagnoses: No diagnosis found.  Plan: Begin daily Dial soap cleansing.  Dry dressings.  Nonweightbearing.  Follow-Up Instructions: Return in about 10 days (around 03/29/2022).   Imaging: No results found.  Orders:  No orders of the defined types were placed in this encounter.  No orders of the defined types were placed in this encounter.    PMFS History: Patient Active Problem List   Diagnosis Date Noted   Peroneal tendinitis of left lower leg    Unilateral primary osteoarthritis, right knee 01/19/2022   Pain in left ankle and joints of left foot 01/19/2022   Acute medial meniscus tear of right knee 12/15/2021   Menopausal state 11/27/2021   Cystic disease of liver 07/08/2021   Mesenteric cyst 07/08/2021   History of kidney stones 07/02/2021   Arthritis of ankle or foot, degenerative, right 03/16/2021   Prediabetes 02/27/2021   Chronic rhinitis 02/26/2021   Gastroesophageal reflux disease 02/26/2021   DUB (dysfunctional uterine bleeding) 11/28/2020   Facet arthropathy, lumbar 11/28/2020   Mild intermittent asthma, uncomplicated 11/28/2020   RLS (restless legs syndrome) 11/28/2020   SK (seborrheic keratosis) 11/28/2020   Vitamin D insufficiency 11/28/2020   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, left  06/27/2019   Mixed hyperlipidemia 02/04/2011   Past Medical History:  Diagnosis Date   Angio-edema    Asthma    Pneumonia    PONV (postoperative nausea and vomiting)    Pre-diabetes    Recurrent upper respiratory infection (URI)    Urticaria     Family History  Problem Relation Age of Onset   Hypertension Mother    Hyperlipidemia Mother    Retinitis pigmentosa Mother    Heart disease Father    Diabetes Father    Prostate cancer Father    Cancer - Prostate Father    Diabetes Brother    Heart attack Maternal Grandmother    Stroke Maternal Grandfather    Heart attack Maternal Grandfather    Heart disease Paternal Grandmother    Epilepsy Paternal Grandmother    Diabetes Paternal Grandfather    Retinitis pigmentosa Maternal Aunt    Allergic rhinitis Neg Hx    Asthma Neg Hx    Eczema Neg Hx    Urticaria Neg Hx    Colon cancer Neg Hx    Rectal cancer Neg Hx    Stomach cancer Neg Hx    Pancreatic cancer Neg Hx    Liver cancer Neg Hx     Past Surgical History:  Procedure Laterality Date   HERNIA REPAIR Bilateral    knee replacement Left 06/2019   LAPAROSCOPIC LYSIS OF ADHESIONS     TUBAL LIGATION     Social History  Occupational History   Occupation: Consulting civil engineer  Tobacco Use   Smoking status: Never   Smokeless tobacco: Never  Vaping Use   Vaping Use: Never used  Substance and Sexual Activity   Alcohol use: Yes    Comment: socially   Drug use: Never   Sexual activity: Yes

## 2022-03-26 ENCOUNTER — Encounter: Payer: Self-pay | Admitting: Family

## 2022-03-26 ENCOUNTER — Ambulatory Visit (INDEPENDENT_AMBULATORY_CARE_PROVIDER_SITE_OTHER): Payer: BC Managed Care – PPO | Admitting: Family

## 2022-03-26 DIAGNOSIS — M19071 Primary osteoarthritis, right ankle and foot: Secondary | ICD-10-CM

## 2022-03-26 DIAGNOSIS — M7672 Peroneal tendinitis, left leg: Secondary | ICD-10-CM

## 2022-03-26 NOTE — Progress Notes (Signed)
Post-Op Visit Note   Patient: Theresa Gonzalez           Date of Birth: 05/17/1966           MRN: [id number] Visit Date: 03/26/2022 PCP: Loyola Mast, MD  Chief Complaint:  Chief Complaint  Patient presents with   Left Ankle - Routine Post Op    03/12/2022 debridement left peroneal brevis and reinforce with longus.     HPI:  HPI The patient is a 56 year old woman 2 weeks status post debridement of the left peroneal brevis reinforcement with the longus she has been nonweightbearing with a kneeling scooter in a cam walker Ortho Exam On examination of the left foot and ankle her incision is well approximated well-healed there is no gaping no drainage no erythema no significant edema  Visit Diagnoses: No diagnosis found.  Plan: May weight-bear as tolerated in her cam work on range of motion at rest.  Sutures harvested without incident.  We will follow-up once more in 2 weeks  Follow-Up Instructions: No follow-ups on file.   Imaging: No results found.  Orders:  No orders of the defined types were placed in this encounter.  No orders of the defined types were placed in this encounter.    PMFS History: Patient Active Problem List   Diagnosis Date Noted   Peroneal tendinitis of left lower leg    Unilateral primary osteoarthritis, right knee 01/19/2022   Pain in left ankle and joints of left foot 01/19/2022   Acute medial meniscus tear of right knee 12/15/2021   Menopausal state 11/27/2021   Cystic disease of liver 07/08/2021   Mesenteric cyst 07/08/2021   History of kidney stones 07/02/2021   Arthritis of ankle or foot, degenerative, right 03/16/2021   Prediabetes 02/27/2021   Chronic rhinitis 02/26/2021   Gastroesophageal reflux disease 02/26/2021   DUB (dysfunctional uterine bleeding) 11/28/2020   Facet arthropathy, lumbar 11/28/2020   Mild intermittent asthma, uncomplicated 11/28/2020   RLS (restless legs syndrome) 11/28/2020   SK (seborrheic keratosis) 11/28/2020    Vitamin D insufficiency 11/28/2020   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, 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   Mixed hyperlipidemia 02/04/2011   Past Medical History:  Diagnosis Date   Angio-edema    Asthma    Pneumonia    PONV (postoperative nausea and vomiting)    Pre-diabetes    Recurrent upper respiratory infection (URI)    Urticaria     Family History  Problem Relation Age of Onset   Hypertension Mother    Hyperlipidemia Mother    Retinitis pigmentosa Mother    Heart disease Father    Diabetes Father    Prostate cancer Father    Cancer - Prostate Father    Diabetes Brother    Heart attack Maternal Grandmother    Stroke Maternal Grandfather    Heart attack Maternal Grandfather    Heart disease Paternal Grandmother    Epilepsy Paternal Grandmother    Diabetes Paternal Grandfather    Retinitis pigmentosa Maternal Aunt    Allergic rhinitis Neg Hx    Asthma Neg Hx    Eczema Neg Hx    Urticaria Neg Hx    Colon cancer Neg Hx    Rectal cancer Neg Hx    Stomach cancer Neg Hx    Pancreatic cancer Neg Hx    Liver cancer Neg Hx     Past Surgical History:  Procedure Laterality Date   HERNIA REPAIR Bilateral    knee  replacement Left 06/2019   LAPAROSCOPIC LYSIS OF ADHESIONS     TUBAL LIGATION     Social History   Occupational History   Occupation: Ship broker  Tobacco Use   Smoking status: Never   Smokeless tobacco: Never  Vaping Use   Vaping Use: Never used  Substance and Sexual Activity   Alcohol use: Yes    Comment: socially   Drug use: Never   Sexual activity: Yes

## 2022-04-06 ENCOUNTER — Ambulatory Visit (INDEPENDENT_AMBULATORY_CARE_PROVIDER_SITE_OTHER): Payer: BC Managed Care – PPO | Admitting: Orthopedic Surgery

## 2022-04-06 DIAGNOSIS — M7672 Peroneal tendinitis, left leg: Secondary | ICD-10-CM

## 2022-04-08 ENCOUNTER — Encounter: Payer: Self-pay | Admitting: Gastroenterology

## 2022-04-13 ENCOUNTER — Encounter: Payer: Self-pay | Admitting: Orthopedic Surgery

## 2022-04-13 NOTE — Progress Notes (Signed)
Office Visit Note   Patient: Theresa Gonzalez           Date of Birth: [birth year]           MRN: [id number] Visit Date: 04/06/2022              Requested by: Haydee Salter, MD [address] [place],  [place] [address] PCP: Haydee Salter, MD  Chief Complaint  Patient presents with   Left Ankle - Routine Post Op    03/12/2022 debridement left peroneal brevis and reinforce with peroneal longus       HPI: Patient is a 56 year old woman who is seen 4 weeks status post debridement peroneus brevis and reinforcement with the peroneus longus for degenerative tearing of the peroneus brevis tendon.  Patient is currently weightbearing in a fracture boot.  Assessment & Plan: Visit Diagnoses:  1. Peroneal tendinitis of left lower leg     Plan: We will have her change to an ASO she was given instructions for strengthening with Thera-Band's.  Follow-Up Instructions: Return in about 4 weeks (around 05/04/2022).   Ortho Exam  Patient is alert, oriented, no adenopathy, well-dressed, normal affect, normal respiratory effort. Examination the incision is well-healed there is no redness cellulitis or drainage.  Imaging: No results found. No images are attached to the encounter.  Labs: Lab Results  Component Value Date   HGBA1C 6.0 02/27/2021   ESRSEDRATE 2 01/12/2021   CRP 1 01/12/2021     Lab Results  Component Value Date   ALBUMIN 4.7 07/30/2021   ALBUMIN 4.5 07/24/2021   ALBUMIN 4.5 07/02/2021    No results found for: "MG" No results found for: "VD25OH"  No results found for: "PREALBUMIN"    Latest Ref Rng & Units 03/12/2022    6:10 AM 07/30/2021    5:02 PM 07/24/2021    8:00 AM  CBC EXTENDED  WBC 4.0 - 10.5 K/uL 5.1  5.8  4.3   RBC 3.87 - 5.11 MIL/uL 4.81  4.69  4.87   Hemoglobin 12.0 - 15.0 g/dL 13.9  13.5  13.9   HCT 36.0 - 46.0 % 41.6  39.2  40.0   Platelets 150 - 400 K/uL 256  274  243   NEUT# 1.7 - 7.7 K/uL   2.2   Lymph# 0.7 - 4.0 K/uL   1.5       There is no height or weight on file to calculate BMI.  Orders:  No orders of the defined types were placed in this encounter.  No orders of the defined types were placed in this encounter.    Procedures: No procedures performed  Clinical Data: No additional findings.  ROS:  All other systems negative, except as noted in the HPI. Review of Systems  Objective: Vital Signs: There were no vitals taken for this visit.  Specialty Comments:  No specialty comments available.  PMFS History: Patient Active Problem List   Diagnosis Date Noted   Peroneal tendinitis of left lower leg    Unilateral primary osteoarthritis, right knee 01/19/2022   Pain in left ankle and joints of left foot 01/19/2022   Acute medial meniscus tear of right knee 12/15/2021   Menopausal state 11/27/2021   Cystic disease of liver 07/08/2021   Mesenteric cyst 07/08/2021   History of kidney stones 07/02/2021   Arthritis of ankle or foot, degenerative, right 03/16/2021   Prediabetes 02/27/2021   Chronic rhinitis 02/26/2021   Gastroesophageal reflux disease 02/26/2021   DUB (dysfunctional  uterine bleeding) 11/28/2020   Facet arthropathy, lumbar 11/28/2020   Mild intermittent asthma, uncomplicated 0000000   RLS (restless legs syndrome) 11/28/2020   SK (seborrheic keratosis) 11/28/2020   Vitamin D insufficiency 11/28/2020   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, 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   Mixed hyperlipidemia 02/04/2011   Past Medical History:  Diagnosis Date   Angio-edema    Asthma    Pneumonia    PONV (postoperative nausea and vomiting)    Pre-diabetes    Recurrent upper respiratory infection (URI)    Urticaria     Family History  Problem Relation Age of Onset   Hypertension Mother    Hyperlipidemia Mother    Retinitis pigmentosa Mother    Heart disease Father    Diabetes Father    Prostate cancer Father    Cancer - Prostate Father    Diabetes Brother     Heart attack Maternal Grandmother    Stroke Maternal Grandfather    Heart attack Maternal Grandfather    Heart disease Paternal Grandmother    Epilepsy Paternal Grandmother    Diabetes Paternal Grandfather    Retinitis pigmentosa Maternal Aunt    Allergic rhinitis Neg Hx    Asthma Neg Hx    Eczema Neg Hx    Urticaria Neg Hx    Colon cancer Neg Hx    Rectal cancer Neg Hx    Stomach cancer Neg Hx    Pancreatic cancer Neg Hx    Liver cancer Neg Hx     Past Surgical History:  Procedure Laterality Date   HERNIA REPAIR Bilateral    knee replacement Left 06/2019   LAPAROSCOPIC LYSIS OF ADHESIONS     TUBAL LIGATION     Social History   Occupational History   Occupation: Ship broker  Tobacco Use   Smoking status: Never   Smokeless tobacco: Never  Vaping Use   Vaping Use: Never used  Substance and Sexual Activity   Alcohol use: Yes    Comment: socially   Drug use: Never   Sexual activity: Yes

## 2022-04-23 ENCOUNTER — Ambulatory Visit
Admission: RE | Admit: 2022-04-23 | Discharge: 2022-04-23 | Disposition: A | Discharge status: Home / Self Care | Payer: BC Managed Care – PPO | Source: Ambulatory Visit | Attending: Gastroenterology | Admitting: Gastroenterology

## 2022-04-23 DIAGNOSIS — R1011 Right upper quadrant pain: Secondary | ICD-10-CM

## 2022-04-23 DIAGNOSIS — K668 Other specified disorders of peritoneum: Secondary | ICD-10-CM | POA: Diagnosis not present

## 2022-04-23 DIAGNOSIS — M940 Chondrocostal junction syndrome [Tietze]: Secondary | ICD-10-CM

## 2022-04-23 DIAGNOSIS — K7689 Other specified diseases of liver: Secondary | ICD-10-CM | POA: Diagnosis not present

## 2022-04-23 MED ORDER — GADOPICLENOL 0.5 MMOL/ML IV SOLN
7.0000 mL | Freq: Once | INTRAVENOUS | Status: AC | PRN
Start: 2022-04-23 — End: 2022-04-23
  Administered 2022-04-23: 7 mL via INTRAVENOUS

## 2022-05-05 ENCOUNTER — Ambulatory Visit (INDEPENDENT_AMBULATORY_CARE_PROVIDER_SITE_OTHER): Payer: BC Managed Care – PPO | Admitting: Family

## 2022-05-05 ENCOUNTER — Encounter: Payer: Self-pay | Admitting: Family

## 2022-05-05 DIAGNOSIS — M7672 Peroneal tendinitis, left leg: Secondary | ICD-10-CM

## 2022-05-05 DIAGNOSIS — M19071 Primary osteoarthritis, right ankle and foot: Secondary | ICD-10-CM

## 2022-05-05 NOTE — Progress Notes (Signed)
Post-Op Visit Note   Patient: Theresa Gonzalez           Date of Birth: 18-Dec-1965           MRN: [id number] Visit Date: 05/05/2022 PCP: Haydee Salter, MD  Chief Complaint:  Chief Complaint  Patient presents with   Left Ankle - Routine Post Op    03/12/2022 debridement left peroneal brevis and reinforce with peroneal longus     HPI:  HPI The patient is a 56 year old woman seen status post debridement of the peroneus brevis with reinforcement of the peroneus longus.  She is now in regular shoewear and feels confident walking on stable terrain she felt the ASO is painful on the lateral ankle she has been working on her home exercise program is quite pleased with her progress she is interested in any further home exercises she could incorporate Left Ankle Exam   Muscle Strength  The patient has normal left ankle strength.  Tests  Anterior drawer: negative     On examination of the left ankle there is mild to moderate edema no erythema no warmth.  The incision is well-healed.  Visit Diagnoses: No diagnosis found.  Plan: She will continue working on her home exercise program with Thera-Band's.  Increase her activities as she tolerates.  Will refer to PT for some guidance with strengthening  Follow-Up Instructions: No follow-ups on file.   Imaging: No results found.  Orders:  No orders of the defined types were placed in this encounter.  No orders of the defined types were placed in this encounter.    PMFS History: Patient Active Problem List   Diagnosis Date Noted   Peroneal tendinitis of left lower leg    Unilateral primary osteoarthritis, right knee 01/19/2022   Pain in left ankle and joints of left foot 01/19/2022   Acute medial meniscus tear of right knee 12/15/2021   Menopausal state 11/27/2021   Cystic disease of liver 07/08/2021   Mesenteric cyst 07/08/2021   History of kidney stones 07/02/2021   Arthritis of ankle or foot, degenerative, right 03/16/2021    Prediabetes 02/27/2021   Chronic rhinitis 02/26/2021   Gastroesophageal reflux disease 02/26/2021   DUB (dysfunctional uterine bleeding) 11/28/2020   Facet arthropathy, lumbar 11/28/2020   Mild intermittent asthma, uncomplicated 98/33/8250   RLS (restless legs syndrome) 11/28/2020   SK (seborrheic keratosis) 11/28/2020   Vitamin D insufficiency 11/28/2020   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, 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   Mixed hyperlipidemia 02/04/2011   Past Medical History:  Diagnosis Date   Angio-edema    Asthma    Pneumonia    PONV (postoperative nausea and vomiting)    Pre-diabetes    Recurrent upper respiratory infection (URI)    Urticaria     Family History  Problem Relation Age of Onset   Hypertension Mother    Hyperlipidemia Mother    Retinitis pigmentosa Mother    Heart disease Father    Diabetes Father    Prostate cancer Father    Cancer - Prostate Father    Diabetes Brother    Heart attack Maternal Grandmother    Stroke Maternal Grandfather    Heart attack Maternal Grandfather    Heart disease Paternal Grandmother    Epilepsy Paternal Grandmother    Diabetes Paternal Grandfather    Retinitis pigmentosa Maternal Aunt    Allergic rhinitis Neg Hx    Asthma Neg Hx    Eczema Neg Hx  Urticaria Neg Hx    Colon cancer Neg Hx    Rectal cancer Neg Hx    Stomach cancer Neg Hx    Pancreatic cancer Neg Hx    Liver cancer Neg Hx     Past Surgical History:  Procedure Laterality Date   HERNIA REPAIR Bilateral    knee replacement Left 06/2019   LAPAROSCOPIC LYSIS OF ADHESIONS     TUBAL LIGATION     Social History   Occupational History   Occupation: Consulting civil engineer  Tobacco Use   Smoking status: Never   Smokeless tobacco: Never  Vaping Use   Vaping Use: Never used  Substance and Sexual Activity   Alcohol use: Yes    Comment: socially   Drug use: Never   Sexual activity: Yes

## 2022-05-07 ENCOUNTER — Ambulatory Visit
Admission: RE | Admit: 2022-05-07 | Discharge: 2022-05-07 | Disposition: A | Discharge status: Home / Self Care | Payer: BC Managed Care – PPO | Source: Ambulatory Visit | Attending: Emergency Medicine | Admitting: Emergency Medicine

## 2022-05-07 VITALS — BP 129/84 | HR 71 | Temp 98.1°F | Resp 16

## 2022-05-07 DIAGNOSIS — H6992 Unspecified Eustachian tube disorder, left ear: Secondary | ICD-10-CM

## 2022-05-07 DIAGNOSIS — J310 Chronic rhinitis: Secondary | ICD-10-CM | POA: Diagnosis not present

## 2022-05-07 DIAGNOSIS — Z8709 Personal history of other diseases of the respiratory system: Secondary | ICD-10-CM

## 2022-05-07 DIAGNOSIS — J4520 Mild intermittent asthma, uncomplicated: Secondary | ICD-10-CM

## 2022-05-07 MED ORDER — ALBUTEROL SULFATE HFA 108 (90 BASE) MCG/ACT IN AERS: 2.0000 | INHALATION_SPRAY | Freq: Four times a day (QID) | RESPIRATORY_TRACT | 2 refills | Status: DC | PRN

## 2022-05-07 MED ORDER — FEXOFENADINE HCL 180 MG PO TABS: 180.0000 mg | ORAL_TABLET | Freq: Every day | ORAL | 1 refills | Status: DC

## 2022-05-07 MED ORDER — FLUTICASONE PROPIONATE 50 MCG/ACT NA SUSP: 1.0000 | Freq: Every day | NASAL | 2 refills | Status: DC

## 2022-05-07 NOTE — ED Provider Notes (Signed)
[facility]    CSN: [id number] Arrival date & time: 05/07/22  0815    HISTORY   Chief Complaint  Patient presents with   Cough    Cough with a little tightness in chest. Cough is mostly dry with a little clear/opaque sputum on occasion. Slight ear pain in left ear. Sinuses seem clear - Entered by patient   HPI Theresa Gonzalez is a pleasant, 56 y.o. female who presents to urgent care today. Patient reports a history of bronchitis, states that last year she did not have an acute exacerbation but did year before last seen in several years before that.  Patient states she also recently traveled by air to [place], states she had significant pain in her left ear when doing so.  Patient reports a history of allergies and chronic postnasal drip.  Patient states she takes Benadryl for allergy symptoms chronic postnasal drip because she does not tolerate Zyrtec or Xyzal, as that she does not believe she is ever tried [employer].  Patient states she has been prescribed albuterol in the past for her bronchitis and cough, states her most recent prescription is likely over a-year-old.  Patient states at this time, she has been having a nonproductive cough and has begun to notice familiar tightness in her chest.  Patient states occasionally she coughs up clear sputum but does not feel that it from in her chest.  Patient states she has not been experiencing any sinus pain or pressure, sore throat, body ache, chills, fever, fatigue, headache, nausea, vomiting, diarrhea.  The history is provided by the patient.   Past Medical History:  Diagnosis Date   Angio-edema    Asthma    Pneumonia    PONV (postoperative nausea and vomiting)    Pre-diabetes    Recurrent upper respiratory infection (URI)    Urticaria    Patient Active Problem List   Diagnosis Date Noted   Peroneal tendinitis of left lower leg    Unilateral primary osteoarthritis, right knee 01/19/2022   Pain in left ankle and joints of left  foot 01/19/2022   Acute medial meniscus tear of right knee 12/15/2021   Menopausal state 11/27/2021   Cystic disease of liver 07/08/2021   Mesenteric cyst 07/08/2021   History of kidney stones 07/02/2021   Arthritis of ankle or foot, degenerative, right 03/16/2021   Prediabetes 02/27/2021   Chronic rhinitis 02/26/2021   Gastroesophageal reflux disease 02/26/2021   DUB (dysfunctional uterine bleeding) 11/28/2020   Facet arthropathy, lumbar 11/28/2020   Mild intermittent asthma, uncomplicated 11/28/2020   RLS (restless legs syndrome) 11/28/2020   SK (seborrheic keratosis) 11/28/2020   Vitamin D insufficiency 11/28/2020   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    Home Medications    Prior to Admission medications   Medication Sig Start Date End Date Taking? Authorizing Provider  Berberine Chloride (BERBERINE HCI PO) Take 450 mg by mouth 2 (two) times daily.    [provider]  Cyanocobalamin (VITAMIN B-12) 5000 MCG SUBL Place 5,000 mcg under the tongue daily.    [provider]  estradiol (CLIMARA - DOSED IN MG/24 HR) 0.05 mg/24hr patch Place 0.5 mg onto  the skin once a week. 02/24/22   [provider]  fenofibrate (TRICOR) 145 MG tablet Take 1 tablet (145 mg total) by mouth daily. 11/27/21   Loyola Mast, MD  HYDROcodone-acetaminophen (NORCO/VICODIN) 5-325 MG tablet Take 1 tablet by mouth every 4 (four) hours as needed. 03/12/22   Nadara Mustard, MD  levocetirizine (XYZAL) 5 MG tablet Take 1 tablet (5 mg total) by mouth every evening. 01/12/21   Alfonse Spruce, MD  MAGNESIUM GLUCONATE PO Take 120 mg by mouth daily.    [provider]  Omega-3 Fatty  Acids (OMEGA 3 PO) Take 770 mcg by mouth daily. Vegan    [provider]  omeprazole (PRILOSEC) 20 MG capsule Take 20 mg by mouth daily.    [provider]  ondansetron (ZOFRAN) 4 MG tablet Take 1 tablet (4 mg total) by mouth every 8 (eight) hours as needed for nausea or vomiting. 05/17/21   [name], PA-C  OVER THE COUNTER MEDICATION Take 306 mg by mouth daily. Eastergen control    [provider]  OVER THE COUNTER MEDICATION Take 1 capsule by mouth daily. Thyroid Support    [provider]  OVER THE COUNTER MEDICATION Take 1 capsule by mouth daily. Mega D3 Mk7    [provider]  pramipexole (MIRAPEX) 1 MG tablet Take 1 tablet (1 mg total) by mouth at bedtime. 11/27/21   Loyola Mast, MD  Prasterone, DHEA, (DHEA PO) Take 5 mg by mouth daily.    [provider]  Pregnenolone Micronized (PREGNENOLONE PO) Take 75 mg by mouth daily.    [provider]  Probiotic Product (PROBIOTIC DAILY) CAPS Take 1 capsule by mouth daily.    [provider]  progesterone (PROMETRIUM) 100 MG capsule Take 300 mg by mouth at bedtime. 11/25/21   [provider]  thiamine (VITAMIN B-1) 100 MG tablet Take 100 mg by mouth daily.    [provider]  thyroid (ARMOUR) 30 MG tablet Take 30 mg by mouth every morning. 11/25/21   [provider]    Family History Family History  Problem Relation Age of Onset   Hypertension Mother    Hyperlipidemia Mother    Retinitis pigmentosa Mother    Heart disease Father    Diabetes Father    Prostate cancer Father    Cancer - Prostate Father    Diabetes Brother    Heart attack Maternal Grandmother    Stroke Maternal Grandfather    Heart attack Maternal Grandfather    Heart disease Paternal Grandmother    Epilepsy Paternal Grandmother    Diabetes Paternal Grandfather    Retinitis pigmentosa Maternal Aunt    Allergic rhinitis Neg Hx    Asthma Neg Hx    Eczema Neg Hx     Urticaria Neg Hx    Colon cancer Neg Hx    Rectal cancer Neg Hx    Stomach cancer Neg Hx    Pancreatic cancer Neg Hx    Liver cancer Neg Hx    Social History Social History   Tobacco Use   Smoking status: Never   Smokeless tobacco: Never  Vaping Use   Vaping Use: Never used  Substance Use Topics   Alcohol use: Yes    Comment: socially   Drug use: Never   Allergies   Penicillins, Potassium citrate, Ropinirole, Cetirizine hcl, Nabumetone, Nsaids, and Sodium hyaluronate  Review of Systems Review of Systems Pertinent findings revealed after performing a 14 point  review of systems has been noted in the history of present illness.  Physical Exam Triage Vital Signs ED Triage Vitals  Enc Vitals Group     BP 05/01/21 0827 (!) 147/82     Pulse Rate 05/01/21 0827 72     Resp 05/01/21 0827 18     Temp 05/01/21 0827 98.3 F (36.8 C)     Temp Source 05/01/21 0827 Oral     SpO2 05/01/21 0827 98 %     Weight --      Height --      Head Circumference --      Peak Flow --      Pain Score 05/01/21 0826 5     Pain Loc --      Pain Edu? --      Excl. in GC? --   No data found.  Updated Vital Signs BP 129/84 (BP Location: Right Arm)   Pulse 71   Temp 98.1 F (36.7 C) (Oral)   Resp 16   SpO2 98%   Physical Exam Vitals and nursing note reviewed.  Constitutional:      General: She is not in acute distress.    Appearance: Normal appearance. She is not ill-appearing.  HENT:     Head: Normocephalic and atraumatic.     Salivary Glands: Right salivary gland is not diffusely enlarged or tender. Left salivary gland is not diffusely enlarged or tender.     Right Ear: Ear canal and external ear normal. No drainage. A middle ear effusion is present. There is no impacted cerumen. Tympanic membrane is bulging. Tympanic membrane is not injected or erythematous.     Left Ear: Ear canal and external ear normal. No drainage. A middle ear effusion is present. There is no impacted cerumen.  Tympanic membrane is bulging. Tympanic membrane is not injected or erythematous.     Ears:     Comments: Bilateral EACs normal, both TMs bulging with clear fluid    Nose: Rhinorrhea present. No nasal deformity, septal deviation, signs of injury, nasal tenderness, mucosal edema or congestion. Rhinorrhea is clear.     Right Nostril: Occlusion present. No foreign body, epistaxis or septal hematoma.     Left Nostril: Occlusion present. No foreign body, epistaxis or septal hematoma.     Right Turbinates: Enlarged, swollen and pale.     Left Turbinates: Enlarged, swollen and pale.     Right Sinus: No maxillary sinus tenderness or frontal sinus tenderness.     Left Sinus: No maxillary sinus tenderness or frontal sinus tenderness.     Mouth/Throat:     Lips: Pink. No lesions.     Mouth: Mucous membranes are moist. No oral lesions.     Pharynx: Oropharynx is clear. Uvula midline. No posterior oropharyngeal erythema or uvula swelling.     Tonsils: No tonsillar exudate. 0 on the right. 0 on the left.     Comments: Postnasal drip Eyes:     General: Lids are normal.        Right eye: No discharge.        Left eye: No discharge.     Extraocular Movements: Extraocular movements intact.     Conjunctiva/sclera: Conjunctivae normal.     Right eye: Right conjunctiva is not injected.     Left eye: Left conjunctiva is not injected.  Neck:     Trachea: Trachea and phonation normal.  Cardiovascular:     Rate and Rhythm: Normal rate and regular rhythm.  Pulses: Normal pulses.     Heart sounds: Normal heart sounds. No murmur heard.    No friction rub. No gallop.  Pulmonary:     Effort: Pulmonary effort is normal. No accessory muscle usage, prolonged expiration or respiratory distress.     Breath sounds: Normal breath sounds. No stridor, decreased air movement or transmitted upper airway sounds. No decreased breath sounds, wheezing, rhonchi or rales.  Chest:     Chest wall: No tenderness.   Musculoskeletal:        General: Normal range of motion.     Cervical back: Normal range of motion and neck supple. Normal range of motion.  Lymphadenopathy:     Cervical: No cervical adenopathy.  Skin:    General: Skin is warm and dry.     Findings: No erythema or rash.  Neurological:     General: No focal deficit present.     Mental Status: She is alert and oriented to person, place, and time.  Psychiatric:        Mood and Affect: Mood normal.        Behavior: Behavior normal.     Visual Acuity Right Eye Distance:   Left Eye Distance:   Bilateral Distance:    Right Eye Near:   Left Eye Near:    Bilateral Near:     [facility] Couse / Diagnostics / Procedures:     Radiology No results found.  Procedures Procedures (including critical care time) EKG  Pending results:  Labs Reviewed - No data to display  Medications Ordered in UC: Medications - No data to display  UC Diagnoses / Final Clinical Impressions(s)   I have reviewed the triage vital signs and the nursing notes.  Pertinent labs & imaging results that were available during my care of the patient were reviewed by me and considered in my medical decision making (see chart for details).    Final diagnoses:  Mild intermittent asthma in adult without complication  Eustachian tube dysfunction, left  Chronic rhinitis  History of bronchitis   For chronic rhinitis, patient provided with a prescription for fexofenadine but advised to try a smaller quantity first before purchasing the 90-day prescription.  Patient also advised to regularly use Flonase which she states she has used in the past.  At this time, I do not believe patient is suffering from bronchitis but based on the history she provided to me today, do believe that she would benefit from albuterol to prevent acute bronchitis from setting in.  Patient provided with a prescription and refills.  Return precautions advised.  ED Prescriptions     Medication Sig  Dispense Auth. Provider   albuterol (VENTOLIN HFA) 108 (90 Base) MCG/ACT inhaler Inhale 2 puffs into the lungs every 6 (six) hours as needed for wheezing or shortness of breath (Cough). 36 g Lynden Oxford Scales, PA-C   fexofenadine (ALLEGRA) 180 MG tablet Take 1 tablet (180 mg total) by mouth daily. 90 tablet Lynden Oxford Scales, PA-C   fluticasone (FLONASE) 50 MCG/ACT nasal spray Place 1 spray into both nostrils daily. Begin by using 2 sprays in each nare daily for 3 to 5 days, then decrease to 1 spray in each nare daily. 15.8 mL Lynden Oxford Scales, PA-C      PDMP not reviewed this encounter.  Disposition Upon Discharge:  Condition: stable for discharge home Home: take medications as prescribed; routine discharge instructions as discussed; follow up as advised.  Patient presented with an acute illness with associated systemic  symptoms and significant discomfort requiring urgent management. In my opinion, this is a condition that a prudent lay person (someone who possesses an average knowledge of health and medicine) may potentially expect to result in complications if not addressed urgently such as respiratory distress, impairment of bodily function or dysfunction of bodily organs.   Routine symptom specific, illness specific and/or disease specific instructions were discussed with the patient and/or caregiver at length.   As such, the patient has been evaluated and assessed, work-up was performed and treatment was provided in alignment with urgent care protocols and evidence based medicine.  Patient/parent/caregiver has been advised that the patient may require follow up for further testing and treatment if the symptoms continue in spite of treatment, as clinically indicated and appropriate.  If the patient was tested for COVID-19, Influenza and/or RSV, then the patient/parent/guardian was advised to isolate at home pending the results of his/her diagnostic coronavirus test and  potentially longer if they're positive. I have also advised pt that if his/her COVID-19 test returns positive, it's recommended to self-isolate for at least 10 days after symptoms first appeared AND until fever-free for 24 hours without fever reducer AND other symptoms have improved or resolved. Discussed self-isolation recommendations as well as instructions for household member/close contacts as per the [facility] and [place] [facility], and also gave patient the COVID packet with this information.  Patient/parent/caregiver has been advised to return to the [facility] or PCP in 3-5 days if no better; to PCP or the Emergency Department if new signs and symptoms develop, or if the current signs or symptoms continue to change or worsen for further workup, evaluation and treatment as clinically indicated and appropriate  The patient will follow up with their current PCP if and as advised. If the patient does not currently have a PCP we will assist them in obtaining one.   The patient may need specialty follow up if the symptoms continue, in spite of conservative treatment and management, for further workup, evaluation, consultation and treatment as clinically indicated and appropriate.  Patient/parent/caregiver verbalized understanding and agreement of plan as discussed.  All questions were addressed during visit.  Please see discharge instructions below for further details of plan.  Discharge Instructions:   Discharge Instructions      Your symptoms and my physical exam findings are concerning for exacerbation of your underlying allergies and bronchitis.     Please see the list below for recommended medications, dosages and frequencies to provide relief of current symptoms and help you stay ahead of an acute attack of bronchitis throughout the winter season:     Allegra (fexofenadine): This is an excellent second-generation antihistamine that helps to reduce respiratory inflammatory response to environmental allergens.   This medication is not known to cause daytime sleepiness so it can be taken in the daytime.  Because you are uncertain how you respond to this medication, recommend that you purchase a small quantity to try.  For your convenience, I have sent you a 90-day prescription with a refill to help you get through the winter season that you can use if you do well on it.  If you do not do well, please feel free to continue taking Benadryl every 6 hours as needed.   Flonase (fluticasone): This is a steroid nasal spray that you use once daily, 1 spray in each nare.  This medication does not work well if you decide to use it only used as you feel you need to, it works best used  on a daily basis.  After 3 to 5 days of use, you will notice significant reduction of the inflammation and mucus production that is currently being caused by exposure to allergens, whether seasonal or environmental.  The most common side effect of this medication is nosebleeds.  If you experience a nosebleed, please discontinue use for 1 week, then feel free to resume.  I have provided you with a prescription and recommend that you continue using it throughout the winter season.     ProAir, Ventolin, Proventil (albuterol): This inhaled medication contains a short acting beta agonist bronchodilator.  This medication works on the smooth muscle that opens and constricts of your airways by relaxing the muscle.  The result of relaxation of the smooth muscle is increased air movement and improved work of breathing.  This is a short acting medication that can be used every 4-6 hours as needed for increased work of breathing, shortness of breath, wheezing and excessive coughing.  I have provided you with a prescription and refills.  For the next several days, I recommend that you inhale 2 puffs at breakfast, lunch, dinner and bedtime from 4 times a day).  After that, you can decrease to twice daily and as needed.   Advil, Motrin (ibuprofen): This is a good  anti-inflammatory medication which not only addresses aches, pains but also significantly reduces soft tissue inflammation of the upper airways that causes sinus and nasal congestion as well as inflammation of the lower airways which makes you feel like your breathing is constricted or your cough feel tight.  I recommend that you take 400 mg every 8 hours as needed, I provided you with a prescription.      If you find that your health insurance will not pay for allergy medications, please consider downloading the GoodRx app and using to get a better price than the "off the shelf" price.  Please see the QR code below.   If you find that you have not had improvement of your symptoms in the next 5 to 7 days or if your symptoms become worse during this time, please follow-up with your primary care provider or return here to urgent care for repeat evaluation and further recommendations.   Thank you for visiting urgent care today.  We appreciate the opportunity to participate in your care.         This office note has been dictated using Teaching laboratory technician.  Unfortunately, this method of dictation can sometimes lead to typographical or grammatical errors.  I apologize for your inconvenience in advance if this occurs.  Please do not hesitate to reach out to me if clarification is needed.      [name] Scales, PA-C 05/07/22 1031

## 2022-05-07 NOTE — ED Triage Notes (Addendum)
The patient c/o cough that began Wednesday. The patient c/o chest tightness and soreness that is exacerbated with cough.   At home covid test was negative on Wednesday.  Home interventions: OTC cough syrup

## 2022-05-07 NOTE — Discharge Instructions (Signed)
Your symptoms and my physical exam findings are concerning for exacerbation of your underlying allergies and bronchitis.     Please see the list below for recommended medications, dosages and frequencies to provide relief of current symptoms and help you stay ahead of an acute attack of bronchitis throughout the winter season:     Allegra (fexofenadine): This is an excellent second-generation antihistamine that helps to reduce respiratory inflammatory response to environmental allergens.  This medication is not known to cause daytime sleepiness so it can be taken in the daytime.  Because you are uncertain how you respond to this medication, recommend that you purchase a small quantity to try.  For your convenience, I have sent you a 90-day prescription with a refill to help you get through the winter season that you can use if you do well on it.  If you do not do well, please feel free to continue taking Benadryl every 6 hours as needed.   Flonase (fluticasone): This is a steroid nasal spray that you use once daily, 1 spray in each nare.  This medication does not work well if you decide to use it only used as you feel you need to, it works best used on a daily basis.  After 3 to 5 days of use, you will notice significant reduction of the inflammation and mucus production that is currently being caused by exposure to allergens, whether seasonal or environmental.  The most common side effect of this medication is nosebleeds.  If you experience a nosebleed, please discontinue use for 1 week, then feel free to resume.  I have provided you with a prescription and recommend that you continue using it throughout the winter season.     ProAir, Ventolin, Proventil (albuterol): This inhaled medication contains a short acting beta agonist bronchodilator.  This medication works on the smooth muscle that opens and constricts of your airways by relaxing the muscle.  The result of relaxation of the smooth muscle is  increased air movement and improved work of breathing.  This is a short acting medication that can be used every 4-6 hours as needed for increased work of breathing, shortness of breath, wheezing and excessive coughing.  I have provided you with a prescription and refills.  For the next several days, I recommend that you inhale 2 puffs at breakfast, lunch, dinner and bedtime from 4 times a day).  After that, you can decrease to twice daily and as needed.   Advil, Motrin (ibuprofen): This is a good anti-inflammatory medication which not only addresses aches, pains but also significantly reduces soft tissue inflammation of the upper airways that causes sinus and nasal congestion as well as inflammation of the lower airways which makes you feel like your breathing is constricted or your cough feel tight.  I recommend that you take 400 mg every 8 hours as needed, I provided you with a prescription.      If you find that your health insurance will not pay for allergy medications, please consider downloading the GoodRx app and using to get a better price than the "off the shelf" price.  Please see the QR code below.   If you find that you have not had improvement of your symptoms in the next 5 to 7 days or if your symptoms become worse during this time, please follow-up with your primary care provider or return here to urgent care for repeat evaluation and further recommendations.   Thank you for visiting urgent care today.  We appreciate the opportunity to participate in your care.

## 2022-05-10 NOTE — Therapy (Signed)
OUTPATIENT PHYSICAL THERAPY LOWER EXTREMITY EVALUATION   Patient Name: Theresa Gonzalez MRN: [id number] DOB:[birth year], 56 y.o., female Today's Date: 05/11/2022   PT End of Session - 05/11/22 1739     Visit Number 1    Date for PT Re-Evaluation 07/11/22    Authorization Type BC[employer]    PT Start Time 1735    PT Stop Time 1820    PT Time Calculation (min) 45 min    Activity Tolerance Patient tolerated treatment well    Behavior During Therapy WFL for tasks assessed/performed             Past Medical History:  Diagnosis Date   Angio-edema    Asthma    Pneumonia    PONV (postoperative nausea and vomiting)    Pre-diabetes    Recurrent upper respiratory infection (URI)    Urticaria    Past Surgical History:  Procedure Laterality Date   HERNIA REPAIR Bilateral    knee replacement Left 06/2019   LAPAROSCOPIC LYSIS OF ADHESIONS     TUBAL LIGATION     Patient Active Problem List   Diagnosis Date Noted   Peroneal tendinitis of left lower leg    Unilateral primary osteoarthritis, right knee 01/19/2022   Pain in left ankle and joints of left foot 01/19/2022   Acute medial meniscus tear of right knee 12/15/2021   Menopausal state 11/27/2021   Cystic disease of liver 07/08/2021   Mesenteric cyst 07/08/2021   History of kidney stones 07/02/2021   Arthritis of ankle or foot, degenerative, right 03/16/2021   Prediabetes 02/27/2021   Chronic rhinitis 02/26/2021   Gastroesophageal reflux disease 02/26/2021   DUB (dysfunctional uterine bleeding) 11/28/2020   Facet arthropathy, lumbar 11/28/2020   Mild intermittent asthma, uncomplicated 11/28/2020   RLS (restless legs syndrome) 11/28/2020   SK (seborrheic keratosis) 11/28/2020   Vitamin D insufficiency 11/28/2020   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  She was in a boot for two weeks, NWB using a scooter.  Reports overall feels good but has balance issues, strength and stiffness issues  PERTINENT HISTORY: See above PAIN:  Are you having pain? Yes: NPRS scale: 0/10 Pain location: left lateral ankle above the malleolous Pain description: nerve tingling. Dull ache Aggravating factors: stairs, uneven surfaces, pivot pain up to 5/10 Relieving factors: off feet pain can be 0/10  PRECAUTIONS: None  WEIGHT BEARING RESTRICTIONS: No  FALLS:  Has patient fallen in last 6 months? No  LIVING ENVIRONMENT: Lives with: lives with their family Lives in: House/apartment Stairs: Yes: Internal: 16 steps; can reach both Has following equipment at home: None  OCCUPATION: student  PLOF: Independent and was walking 2 miles 4 x/week, does a workout video as well  PATIENT GOALS: do stairs without issues, no pain, good strength  NEXT MD VISIT:   OBJECTIVE:   COGNITION: Overall cognitive status: Within functional limits for tasks assessed     SENSATION: C/o numbness and tingling in the left 5th toe and lateral foot  EDEMA:  Circumferential: right 23.5 cm, left 27 cm mid malleolous  PALPATION: Scar has some decreased mobility, palpation of the scar is causing increased tingling of the 5th toe, very tender and tight in the left PF, tender achilles, stiff toes  LOWER EXTREMITY ROM:  Active ROM Right eval Left eval  Hip flexion    Hip extension    Hip abduction    Hip adduction    Hip internal rotation    Hip external rotation    Knee flexion    Knee extension     Ankle dorsiflexion  0  Ankle plantarflexion    Ankle inversion  23  Ankle eversion  2   (Blank rows = not tested)  LOWER EXTREMITY MMT:  MMT Right eval Left eval  Hip flexion    Hip extension    Hip abduction    Hip adduction    Hip internal rotation    Hip external rotation    Knee flexion    Knee extension    Ankle dorsiflexion  4  Ankle plantarflexion  4  Ankle inversion  4  Ankle eversion  4-P!   (Blank rows = not tested)  LOWER EXTREMITY SPECIAL TESTS:  Ankle special tests: SLS left 5-6 seconds  on airex she really struggled reports does not feel safe or stable, ankle  tended to want to roll laterally  FUNCTIONAL TESTS:  Timed up and go (TUG): 10 s  GAIT: Distance walked: 100 feet Assistive device utilized: None Level of assistance: Complete Independence Comments: pain at heel off/toe off, difficulty going down stairs at times has to do one step at a time, pain at heel off   TODAY'S TREATMENT:                                                                                                                              DATE:     PATIENT EDUCATION:  Education details: POC and HEP Person educated: Patient Education method: Explanation Education comprehension: verbalized understanding  HOME EXERCISE PROGRAM: Access Code: [email or web address] URL: [email or web address] Date: 05/11/2022 Prepared by: Lum Babe  Exercises - Standing Gastroc Stretch on Step with Counter Support  - 2 x daily - 7 x weekly - 2 sets - 5 reps - 20 hold - Standing Soleus Stretch  - 2 x daily - 7 x weekly - 2 sets - 5 reps - 20 hold - Standing Dorsiflexion Self-Mobilization on Step  - 2 x daily - 7 x weekly - 2 sets - 5 reps - 2 hold - CLX Ankle Dorsiflexion and Eversion  - 2 x daily - 7 x weekly - 2 sets - 10 reps - 3 hold  ASSESSMENT:  CLINICAL IMPRESSION: Patient is a 56 y.o. female who was seen today for physical therapy evaluation and treatment for left ankle pain  and stiffness, she had peroneal debridement in September, was NWB She has decreased DF, she has swelling and weakness laterally, she had  difficulty on the airex as her foot would roll laterally, has some numbness laterally.   OBJECTIVE IMPAIRMENTS: Abnormal gait, decreased activity tolerance, decreased balance, decreased endurance, decreased mobility, difficulty walking, decreased ROM, decreased strength, increased edema, increased muscle spasms, impaired flexibility, and pain.   REHAB POTENTIAL: Good  CLINICAL DECISION MAKING: Stable/uncomplicated  EVALUATION COMPLEXITY: Low   GOALS: Goals reviewed with patient? Yes  SHORT TERM GOALS: Target date: 05/25/22 Independent with initial HEP Goal status: INITIAL  LONG TERM GOALS: Target date: 08/03/22  Independent with advanced HE{P Goal status: INITIAL  2.  Go down stairs step over step without pain Goal status: INITIAL  3.  Increase DF to 10 degrees Goal status: INITIAL  4.  Increase left ankle eversion to 10 degrees Goal status: INITIAL  5.  Decrease pain 50% at end of day Baseline:  Goal status: INITIAL  PLAN:  PT FREQUENCY: 1-2x/week  PT DURATION: 12 weeks  PLANNED INTERVENTIONS: Therapeutic exercises, Therapeutic activity, Neuromuscular re-education, Balance training, Gait training, Patient/Family education, Self Care, Joint mobilization, Joint manipulation, Taping, Vasopneumatic device, and Manual therapy  PLAN FOR NEXT SESSION: work on the ROM, lateral strength and balance, she is fearful of uneven surfaces   Nichollas Perusse W, PT 05/11/2022, 5:41 PM

## 2022-05-11 ENCOUNTER — Ambulatory Visit: Payer: BC Managed Care – PPO | Attending: Family | Admitting: Physical Therapy

## 2022-05-11 ENCOUNTER — Encounter: Payer: Self-pay | Admitting: Physical Therapy

## 2022-05-11 DIAGNOSIS — R262 Difficulty in walking, not elsewhere classified: Secondary | ICD-10-CM | POA: Diagnosis not present

## 2022-05-11 DIAGNOSIS — M19071 Primary osteoarthritis, right ankle and foot: Secondary | ICD-10-CM | POA: Diagnosis not present

## 2022-05-11 DIAGNOSIS — M25572 Pain in left ankle and joints of left foot: Secondary | ICD-10-CM | POA: Diagnosis not present

## 2022-05-11 DIAGNOSIS — M25672 Stiffness of left ankle, not elsewhere classified: Secondary | ICD-10-CM | POA: Diagnosis not present

## 2022-05-11 DIAGNOSIS — M7672 Peroneal tendinitis, left leg: Secondary | ICD-10-CM | POA: Insufficient documentation

## 2022-05-11 DIAGNOSIS — R2242 Localized swelling, mass and lump, left lower limb: Secondary | ICD-10-CM | POA: Diagnosis not present

## 2022-05-17 ENCOUNTER — Ambulatory Visit (INDEPENDENT_AMBULATORY_CARE_PROVIDER_SITE_OTHER): Payer: BC Managed Care – PPO

## 2022-05-17 ENCOUNTER — Ambulatory Visit
Admission: EM | Admit: 2022-05-17 | Discharge: 2022-05-17 | Disposition: A | Discharge status: Home / Self Care | Payer: BC Managed Care – PPO | Attending: Emergency Medicine | Admitting: Emergency Medicine

## 2022-05-17 DIAGNOSIS — R059 Cough, unspecified: Secondary | ICD-10-CM

## 2022-05-17 DIAGNOSIS — J189 Pneumonia, unspecified organism: Secondary | ICD-10-CM | POA: Diagnosis not present

## 2022-05-17 MED ORDER — PROMETHAZINE-DM 6.25-15 MG/5ML PO SYRP
5.0000 mL | ORAL_SOLUTION | Freq: Four times a day (QID) | ORAL | 0 refills | Status: DC | PRN
Start: 2022-05-17 — End: 2022-06-11

## 2022-05-17 MED ORDER — ALBUTEROL SULFATE (2.5 MG/3ML) 0.083% IN NEBU
2.5000 mg | INHALATION_SOLUTION | Freq: Once | RESPIRATORY_TRACT | Status: AC
  Administered 2022-05-17: 2.5 mg via RESPIRATORY_TRACT

## 2022-05-17 MED ORDER — GUAIFENESIN 400 MG PO TABS
ORAL_TABLET | ORAL | 0 refills | Status: DC
Start: 2022-05-17 — End: 2022-06-11

## 2022-05-17 MED ORDER — LEVOFLOXACIN 750 MG PO TABS: 750.0000 mg | ORAL_TABLET | Freq: Every day | ORAL | 0 refills | Status: AC

## 2022-05-17 NOTE — ED Provider Notes (Signed)
[facility]    CSN: [id number] Arrival date & time: 05/17/22  1536    HISTORY   Chief Complaint  Patient presents with   Cough    Saw [name], PA-C ON 05-07-22 with cough. Cough is not subsiding despite use of albuterol inhaler cough pearls. - Entered by patient   HPI Theresa Gonzalez is a pleasant, 56 y.o. female who presents to urgent care today. Patient returns for follow-up after having been seen 10 days ago for mild intermittent asthma, eustachian tube dysfunction on the left, chronic rhinitis.  Patient states she is not feeling any better at this time and feels that her cough is getting worse.  Patient states has been taking over-the-counter cough syrup without relief.  Patient denies fever, body aches, chills, nausea, vomiting, diarrhea.  Patient has elevated blood pressure on arrival with an oxygen saturation of 93%.  Vital signs are otherwise stable.  Patient is in no acute distress.  The history is provided by the patient.   Past Medical History:  Diagnosis Date   Angio-edema    Asthma    Pneumonia    PONV (postoperative nausea and vomiting)    Pre-diabetes    Recurrent upper respiratory infection (URI)    Urticaria    Patient Active Problem List   Diagnosis Date Noted   Peroneal tendinitis of left lower leg    Unilateral primary osteoarthritis, right knee 01/19/2022   Pain in left ankle and joints of left foot 01/19/2022   Acute medial meniscus tear of right knee 12/15/2021   Menopausal state 11/27/2021   Cystic disease of liver 07/08/2021   Mesenteric cyst 07/08/2021   History of kidney stones 07/02/2021   Arthritis of ankle or foot, degenerative, right 03/16/2021   Prediabetes 02/27/2021   Chronic rhinitis 02/26/2021   Gastroesophageal reflux disease 02/26/2021   DUB (dysfunctional uterine bleeding) 11/28/2020   Facet arthropathy, lumbar 11/28/2020   Mild intermittent asthma, uncomplicated 11/28/2020   RLS (restless legs syndrome) 11/28/2020    SK (seborrheic keratosis) 11/28/2020   Vitamin D insufficiency 11/28/2020   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    Home Medications    Prior to Admission medications   Medication Sig Start Date End Date Taking? Authorizing Provider  albuterol (VENTOLIN HFA) 108 (90 Base) MCG/ACT inhaler Inhale 2 puffs into the lungs every 6 (six) hours as needed for wheezing or shortness of breath (Cough). 05/07/22   [name] Scales, PA-C  Berberine Chloride (BERBERINE HCI PO) Take 450 mg by mouth 2 (two) times daily.    [provider]  Cyanocobalamin (VITAMIN B-12) 5000 MCG SUBL Place 5,000 mcg under the tongue daily.    [provider]  estradiol (CLIMARA - DOSED IN MG/24 HR) 0.05 mg/24hr patch Place 0.5 mg onto the skin once a week. 02/24/22   [provider]  fenofibrate (TRICOR) 145 MG tablet Take 1 tablet (145 mg total) by mouth daily. 11/27/21   Loyola Mast, MD  fexofenadine (ALLEGRA) 180 MG tablet Take 1 tablet (180 mg total) by mouth daily. 05/07/22 11/03/22  [name] Scales, PA-C  fluticasone (FLONASE) 50 MCG/ACT nasal spray Place 1 spray into  both nostrils daily. Begin by using 2 sprays in each nare daily for 3 to 5 days, then decrease to 1 spray in each nare daily. 05/07/22   [name] Scales, PA-C  HYDROcodone-acetaminophen (NORCO/VICODIN) 5-325 MG tablet Take 1 tablet by mouth every 4 (four) hours as needed. 03/12/22   Nadara Mustard, MD  levocetirizine (XYZAL) 5 MG tablet Take 1 tablet (5 mg total) by mouth every evening. 01/12/21   Alfonse Spruce, MD  MAGNESIUM GLUCONATE PO Take 120 mg by mouth daily.    [provider]  Omega-3 Fatty  Acids (OMEGA 3 PO) Take 770 mcg by mouth daily. Vegan    [provider]  omeprazole (PRILOSEC) 20 MG capsule Take 20 mg by mouth daily.    [provider]  ondansetron (ZOFRAN) 4 MG tablet Take 1 tablet (4 mg total) by mouth every 8 (eight) hours as needed for nausea or vomiting. 05/17/21   [name], PA-C  OVER THE COUNTER MEDICATION Take 306 mg by mouth daily. Eastergen control    [provider]  OVER THE COUNTER MEDICATION Take 1 capsule by mouth daily. Thyroid Support    [provider]  OVER THE COUNTER MEDICATION Take 1 capsule by mouth daily. Mega D3 Mk7    [provider]  pramipexole (MIRAPEX) 1 MG tablet Take 1 tablet (1 mg total) by mouth at bedtime. 11/27/21   Loyola Mast, MD  Prasterone, DHEA, (DHEA PO) Take 5 mg by mouth daily.    [provider]  Pregnenolone Micronized (PREGNENOLONE PO) Take 75 mg by mouth daily.    [provider]  Probiotic Product (PROBIOTIC DAILY) CAPS Take 1 capsule by mouth daily.    [provider]  progesterone (PROMETRIUM) 100 MG capsule Take 300 mg by mouth at bedtime. 11/25/21   [provider]  thiamine (VITAMIN B-1) 100 MG tablet Take 100 mg by mouth daily.    [provider]  thyroid (ARMOUR) 30 MG tablet Take 30 mg by mouth every morning. 11/25/21   [provider]    Family History Family History  Problem Relation Age of Onset   Hypertension Mother    Hyperlipidemia Mother    Retinitis pigmentosa Mother    Heart disease Father    Diabetes Father    Prostate cancer Father    Cancer - Prostate Father    Diabetes Brother    Heart attack Maternal Grandmother    Stroke Maternal Grandfather    Heart attack Maternal Grandfather    Heart disease Paternal Grandmother    Epilepsy Paternal Grandmother    Diabetes Paternal Grandfather    Retinitis pigmentosa Maternal Aunt    Allergic rhinitis Neg Hx    Asthma Neg Hx    Eczema Neg Hx     Urticaria Neg Hx    Colon cancer Neg Hx    Rectal cancer Neg Hx    Stomach cancer Neg Hx    Pancreatic cancer Neg Hx    Liver cancer Neg Hx    Social History Social History   Tobacco Use   Smoking status: Never   Smokeless tobacco: Never  Vaping Use   Vaping Use: Never used  Substance Use Topics   Alcohol use: Yes    Comment: socially   Drug use: Never   Allergies   Penicillins, Potassium citrate, Ropinirole, Cetirizine hcl, Nabumetone, Nsaids, and Sodium hyaluronate  Review of Systems Review of Systems Pertinent findings revealed after performing a 14 point  review of systems has been noted in the history of present illness.  Physical Exam Triage Vital Signs ED Triage Vitals  Enc Vitals Group     BP 05/01/21 0827 (!) 147/82     Pulse Rate 05/01/21 0827 72     Resp 05/01/21 0827 18     Temp 05/01/21 0827 98.3 F (36.8 C)     Temp Source 05/01/21 0827 Oral     SpO2 05/01/21 0827 98 %     Weight --      Height --      Head Circumference --      Peak Flow --      Pain Score 05/01/21 0826 5     Pain Loc --      Pain Edu? --      Excl. in GC? --   No data found.  Updated Vital Signs BP (!) 131/91 (BP Location: Right Arm)   Pulse 73   Temp 98.6 F (37 C) (Oral)   Resp 16   SpO2 93%   Physical Exam Vitals and nursing note reviewed.  Constitutional:      General: She is not in acute distress.    Appearance: Normal appearance. She is not ill-appearing.  HENT:     Head: Normocephalic and atraumatic.     Salivary Glands: Right salivary gland is not diffusely enlarged or tender. Left salivary gland is not diffusely enlarged or tender.     Right Ear: Ear canal and external ear normal. No drainage. A middle ear effusion is present. There is no impacted cerumen. Tympanic membrane is bulging. Tympanic membrane is not injected or erythematous.     Left Ear: Ear canal and external ear normal. No drainage. A middle ear effusion is present. There is no impacted cerumen.  Tympanic membrane is bulging. Tympanic membrane is not injected or erythematous.     Ears:     Comments: Bilateral EACs normal, both TMs bulging with clear fluid    Nose: Rhinorrhea present. No nasal deformity, septal deviation, signs of injury, nasal tenderness, mucosal edema or congestion. Rhinorrhea is clear.     Right Nostril: Occlusion present. No foreign body, epistaxis or septal hematoma.     Left Nostril: Occlusion present. No foreign body, epistaxis or septal hematoma.     Right Turbinates: Enlarged, swollen and pale.     Left Turbinates: Enlarged, swollen and pale.     Right Sinus: No maxillary sinus tenderness or frontal sinus tenderness.     Left Sinus: No maxillary sinus tenderness or frontal sinus tenderness.     Mouth/Throat:     Lips: Pink. No lesions.     Mouth: Mucous membranes are moist. No oral lesions.     Pharynx: Oropharynx is clear. Uvula midline. No posterior oropharyngeal erythema or uvula swelling.     Tonsils: No tonsillar exudate. 0 on the right. 0 on the left.     Comments: Postnasal drip Eyes:     General: Lids are normal.        Right eye: No discharge.        Left eye: No discharge.     Extraocular Movements: Extraocular movements intact.     Conjunctiva/sclera: Conjunctivae normal.     Right eye: Right conjunctiva is not injected.     Left eye: Left conjunctiva is not injected.  Neck:     Trachea: Trachea and phonation normal.  Cardiovascular:     Rate and Rhythm: Normal rate and regular rhythm.  Pulses: Normal pulses.     Heart sounds: Normal heart sounds. No murmur heard.    No friction rub. No gallop.  Pulmonary:     Effort: Pulmonary effort is normal. No accessory muscle usage, prolonged expiration or respiratory distress.     Breath sounds: No stridor, decreased air movement or transmitted upper airway sounds. Examination of the right-lower field reveals rales. Rales present. No decreased breath sounds, wheezing or rhonchi.  Chest:      Chest wall: No tenderness.  Musculoskeletal:        General: Normal range of motion.     Cervical back: Normal range of motion and neck supple. Normal range of motion.  Lymphadenopathy:     Cervical: No cervical adenopathy.  Skin:    General: Skin is warm and dry.     Findings: No erythema or rash.  Neurological:     General: No focal deficit present.     Mental Status: She is alert and oriented to person, place, and time.  Psychiatric:        Mood and Affect: Mood normal.        Behavior: Behavior normal.     Visual Acuity Right Eye Distance:   Left Eye Distance:   Bilateral Distance:    Right Eye Near:   Left Eye Near:    Bilateral Near:     [facility] Couse / Diagnostics / Procedures:     Radiology DG Chest 2 View  Result Date: 05/17/2022 CLINICAL DATA:  Cough EXAM: CHEST - 2 VIEW COMPARISON:  Chest x-ray dated July 30, 2021 FINDINGS: The heart size and mediastinal contours are within normal limits. Both lungs are clear. The visualized skeletal structures are unremarkable. IMPRESSION: No active cardiopulmonary disease. Electronically Signed   By: Allegra Lai M.D.   On: 05/17/2022 19:05    Procedures Procedures (including critical care time) EKG  Pending results:  Labs Reviewed - No data to display  Medications Ordered in UC: Medications  albuterol (PROVENTIL) (2.5 MG/3ML) 0.083% nebulizer solution 2.5 mg (2.5 mg Nebulization Given 05/17/22 1856)    UC Diagnoses / Final Clinical Impressions(s)   I have reviewed the triage vital signs and the nursing notes.  Pertinent labs & imaging results that were available during my care of the patient were reviewed by me and considered in my medical decision making (see chart for details).    Final diagnoses:  Community acquired pneumonia of right lower lobe of lung   Patient advised of x-ray findings as well as my physical exam findings.  Patient advised that x-ray findings often lag 24 hours behind actual symptoms.   Patient advised for this reason I recommend that she begin taking antibiotics for presumed right lower lobe pneumonia.  Patient provided with recent cough syrup to help improve production of sputum as well as mepazine DM for nighttime cough.  Patient advised to return in few days for repeat x-ray if not feeling any better.  ED Prescriptions     Medication Sig Dispense Auth. Provider   levofloxacin (LEVAQUIN) 750 MG tablet Take 1 tablet (750 mg total) by mouth daily for 5 days. 5 tablet [name] Scales, PA-C   guaifenesin (HUMIBID E) 400 MG TABS tablet Take 1 tablet 3 times daily as needed for chest congestion and cough 21 tablet [name] Scales, PA-C   promethazine-dextromethorphan (PROMETHAZINE-DM) 6.25-15 MG/5ML syrup Take 5 mLs by mouth 4 (four) times daily as needed for cough. 118 mL [name] Scales, PA-C  PDMP not reviewed this encounter.  Disposition Upon Discharge:  Condition: stable for discharge home Home: take medications as prescribed; routine discharge instructions as discussed; follow up as advised.  Patient presented with an acute illness with associated systemic symptoms and significant discomfort requiring urgent management. In my opinion, this is a condition that a prudent lay person (someone who possesses an average knowledge of health and medicine) may potentially expect to result in complications if not addressed urgently such as respiratory distress, impairment of bodily function or dysfunction of bodily organs.   Routine symptom specific, illness specific and/or disease specific instructions were discussed with the patient and/or caregiver at length.   As such, the patient has been evaluated and assessed, work-up was performed and treatment was provided in alignment with urgent care protocols and evidence based medicine.  Patient/parent/caregiver has been advised that the patient may require follow up for further testing and treatment if the  symptoms continue in spite of treatment, as clinically indicated and appropriate.  If the patient was tested for COVID-19, Influenza and/or RSV, then the patient/parent/guardian was advised to isolate at home pending the results of his/her diagnostic coronavirus test and potentially longer if they're positive. I have also advised pt that if his/her COVID-19 test returns positive, it's recommended to self-isolate for at least 10 days after symptoms first appeared AND until fever-free for 24 hours without fever reducer AND other symptoms have improved or resolved. Discussed self-isolation recommendations as well as instructions for household member/close contacts as per the [facility] and  DH[facility], and also gave patient the COVID packet with this information.  Patient/parent/caregiver has been advised to return to the [facility] or PCP in 3-5 days if no better; to PCP or the Emergency Department if new signs and symptoms develop, or if the current signs or symptoms continue to change or worsen for further workup, evaluation and treatment as clinically indicated and appropriate  The patient will follow up with their current PCP if and as advised. If the patient does not currently have a PCP we will assist them in obtaining one.   The patient may need specialty follow up if the symptoms continue, in spite of conservative treatment and management, for further workup, evaluation, consultation and treatment as clinically indicated and appropriate.  Patient/parent/caregiver verbalized understanding and agreement of plan as discussed.  All questions were addressed during visit.  Please see discharge instructions below for further details of plan.  Discharge Instructions:   Discharge Instructions      The report of your chest x-ray states your lungs are clear but, as we discussed, per my personal read of your chest x-ray you have blunting of the costovertebral angle on the right which is concerning for mild collapse of  your lungs and possible presence of fluid in your right lower lobe.  On physical exam today this is also where I hear abnormal breath sounds concerning for pneumonia.  It is also important to note that chest x-ray imaging often lags a day behind symptoms.  Because your oxygen is lower today than it was at your last visit and your cough became significantly worse today, I think the better part of commonsense is for you to begin antibiotics at this time.  I have sent a prescription for levofloxacin to your pharmacy that I want you to take once daily for the next 5 days.  You are welcome to begin your first dose tonight.  I have also sent prescriptions pacifically for your cough.  Guaifenesin is  an expectorant which will make mucus production easier for you and decrease your effort of coughing.  Promethazine DM is great for nighttime cough.  It dries up nasal secretions, saliva production, makes you very sleepy and suppresses the urge to cough.  Please continue using albuterol (there are refills at your pharmacy), Allegra, Flonase and Xyzal as you have been.  Please follow-up in the next 5-7 days for repeat chest x-ray if you are not feeling significantly better.  Thank you for visiting urgent care today.        This office note has been dictated using Teaching laboratory technician.  Unfortunately, this method of dictation can sometimes lead to typographical or grammatical errors.  I apologize for your inconvenience in advance if this occurs.  Please do not hesitate to reach out to me if clarification is needed.      [name] Scales, PA-C 05/20/22 1206

## 2022-05-17 NOTE — ED Triage Notes (Signed)
The patient c/o cough that began about 1.5 weeks ago.     Home interventions: inhaler, OTC cough syrup

## 2022-05-17 NOTE — Discharge Instructions (Addendum)
The report of your chest x-ray states your lungs are clear but, as we discussed, per my personal read of your chest x-ray you have blunting of the costovertebral angle on the right which is concerning for mild collapse of your lungs and possible presence of fluid in your right lower lobe.  On physical exam today this is also where I hear abnormal breath sounds concerning for pneumonia.  It is also important to note that chest x-ray imaging often lags a day behind symptoms.  Because your oxygen is lower today than it was at your last visit and your cough became significantly worse today, I think the better part of commonsense is for you to begin antibiotics at this time.  I have sent a prescription for levofloxacin to your pharmacy that I want you to take once daily for the next 5 days.  You are welcome to begin your first dose tonight.  I have also sent prescriptions pacifically for your cough.  Guaifenesin is  an expectorant which will make mucus production easier for you and decrease your effort of coughing.  Promethazine DM is great for nighttime cough.  It dries up nasal secretions, saliva production, makes you very sleepy and suppresses the urge to cough.  Please continue using albuterol (there are refills at your pharmacy), Allegra, Flonase and Xyzal as you have been.  Please follow-up in the next 5-7 days for repeat chest x-ray if you are not feeling significantly better.  Thank you for visiting urgent care today.

## 2022-05-18 ENCOUNTER — Ambulatory Visit: Payer: BC Managed Care – PPO | Admitting: Physical Therapy

## 2022-05-24 NOTE — Therapy (Signed)
OUTPATIENT PHYSICAL THERAPY LOWER EXTREMITY TREATMENT   Patient Name: Theresa Gonzalez MRN: [id number] DOB:[birth year], 56 y.o., female Today's Date: 05/25/2022   PT End of Session - 05/25/22 0922     Visit Number 2    Date for PT Re-Evaluation 07/11/22    Authorization Type BCBS    PT Start Time [phone]    PT Stop Time 1010    PT Time Calculation (min) 45 min    Activity Tolerance Patient tolerated treatment well    Behavior During Therapy [facility] for tasks assessed/performed              Past Medical History:  Diagnosis Date   Angio-edema    Asthma    Pneumonia    PONV (postoperative nausea and vomiting)    Pre-diabetes    Recurrent upper respiratory infection (URI)    Urticaria    Past Surgical History:  Procedure Laterality Date   HERNIA REPAIR Bilateral    knee replacement Left 06/2019   LAPAROSCOPIC LYSIS OF ADHESIONS     TUBAL LIGATION     Patient Active Problem List   Diagnosis Date Noted   Peroneal tendinitis of left lower leg    Unilateral primary osteoarthritis, right knee 01/19/2022   Pain in left ankle and joints of left foot 01/19/2022   Acute medial meniscus tear of right knee 12/15/2021   Menopausal state 11/27/2021   Cystic disease of liver 07/08/2021   Mesenteric cyst 07/08/2021   History of kidney stones 07/02/2021   Arthritis of ankle or foot, degenerative, right 03/16/2021   Prediabetes 02/27/2021   Chronic rhinitis 02/26/2021   Gastroesophageal reflux disease 02/26/2021   DUB (dysfunctional uterine bleeding) 11/28/2020   Facet arthropathy, lumbar 11/28/2020   Mild intermittent asthma, uncomplicated 11/28/2020   RLS (restless legs syndrome) 11/28/2020   SK (seborrheic keratosis) 11/28/2020   Vitamin D insufficiency 11/28/2020   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   PERTINENT HISTORY: See above PAIN:  Are you having pain? Yes: NPRS scale: 2/10 Pain location: left lateral ankle above the malleolous Pain description: nerve tingling. Dull ache Aggravating factors: stairs, uneven surfaces, pivot pain up to 5/10 Relieving factors: off feet pain can be 0/10  PRECAUTIONS: None  WEIGHT BEARING RESTRICTIONS: No  FALLS:  Has patient fallen in last 6 months? No  LIVING ENVIRONMENT: Lives with: lives with their family Lives in: House/apartment Stairs: Yes: Internal: 16 steps; can reach both Has following equipment at home: None  OCCUPATION: student  PLOF: Independent and was walking 2 miles 4 x/week, does a workout video as well  PATIENT GOALS: do stairs without issues, no pain, good strength  NEXT MD VISIT:   OBJECTIVE:   COGNITION: Overall cognitive status: Within functional limits for tasks assessed     SENSATION: C/o numbness and tingling in the left 5th toe and lateral foot  EDEMA:  Circumferential: right 23.5 cm, left 27 cm mid malleolous  PALPATION: Scar has some  decreased mobility, palpation of the scar is causing increased tingling of the 5th toe, very tender and tight in the left PF, tender achilles, stiff toes  LOWER EXTREMITY ROM:  Active ROM Right eval Left eval  Hip flexion    Hip extension    Hip abduction    Hip adduction    Hip internal rotation    Hip external rotation    Knee flexion    Knee extension    Ankle dorsiflexion  0  Ankle plantarflexion    Ankle inversion  23  Ankle eversion  2   (Blank rows = not tested)  LOWER  EXTREMITY MMT:  MMT Right eval Left eval  Hip flexion    Hip extension    Hip abduction    Hip adduction    Hip internal rotation    Hip external rotation    Knee flexion    Knee extension    Ankle dorsiflexion  4  Ankle plantarflexion  4  Ankle inversion  4  Ankle eversion  4-P!   (Blank rows = not tested)  LOWER EXTREMITY SPECIAL TESTS:  Ankle special tests: SLS left 5-6 seconds  on airex she really struggled reports does not feel safe or stable, ankle  tended to want to roll laterally  FUNCTIONAL TESTS:  Timed up and go (TUG): 10 s  GAIT: Distance walked: 100 feet Assistive device utilized: None Level of assistance: Complete Independence Comments: pain at heel off/toe off, difficulty going down stairs at times has to do one step at a time, pain at heel off   TODAY'S TREATMENT:                                                                                                                              DATE:   05/25/22 NuStep L5 x33mins  4 way ankle greenTB 2x10 Ankle rockers DF/PF, INV/EV x10 Gastroc stretch on slant 30s  Anterior heel taps 6" Step ups 6"  Walking on beam narrow and side steps  Tandem standing on foam 30s SL on foam 10s  Resisted gait 30# 4 way x 4  Calf raises on bar 2x10   PATIENT EDUCATION:  Education details: POC and HEP Person educated: Patient Education method: Explanation Education comprehension: verbalized understanding  HOME EXERCISE PROGRAM: Access Code: [email or web address] URL: [email or web address] Date: 05/11/2022 Prepared by: Stacie Glaze  Exercises - Standing Gastroc Stretch on Step with Counter Support  - 2 x daily - 7 x weekly - 2 sets - 5 reps - 20 hold - Standing Soleus Stretch  - 2 x daily - 7 x weekly - 2 sets - 5 reps - 20 hold - Standing Dorsiflexion Self-Mobilization on Step  - 2 x daily - 7 x weekly - 2 sets - 5 reps - 2 hold - CLX Ankle Dorsiflexion and Eversion  - 2 x daily - 7 x weekly - 2 sets - 10  reps - 3 hold  ASSESSMENT:  CLINICAL IMPRESSION: Patient returns with some pain in L ankle. She has been following her HEP. She is able to tolerate session without complaints of pain. Does well with all exercises most difficulty with SL balance on foam.   OBJECTIVE IMPAIRMENTS: Abnormal gait, decreased activity tolerance, decreased balance, decreased endurance, decreased mobility, difficulty walking, decreased ROM, decreased strength, increased edema, increased muscle spasms, impaired flexibility, and pain.   REHAB POTENTIAL: Good  CLINICAL DECISION MAKING: Stable/uncomplicated  EVALUATION COMPLEXITY: Low   GOALS: Goals reviewed with patient? Yes  SHORT TERM GOALS: Target date: 05/25/22 Independent with initial HEP Goal status: INITIAL  LONG TERM GOALS: Target date: 08/03/22  Independent with advanced HE{P Goal status: INITIAL  2.  Go down stairs step over step without pain Goal status: INITIAL  3.  Increase DF to 10 degrees Goal status: INITIAL  4.  Increase left ankle eversion to 10 degrees Goal status: INITIAL  5.  Decrease pain 50% at end of day Baseline:  Goal status: INITIAL  PLAN:  PT FREQUENCY: 1-2x/week  PT DURATION: 12 weeks  PLANNED INTERVENTIONS: Therapeutic exercises, Therapeutic activity, Neuromuscular re-education, Balance training, Gait training, Patient/Family education, Self Care, Joint mobilization, Joint manipulation, Taping, Vasopneumatic device, and Manual therapy  PLAN FOR NEXT SESSION: work on the ROM, lateral strength and balance, she is fearful of uneven surfaces   [employer], PT 05/25/2022, 10:11 AM

## 2022-05-25 ENCOUNTER — Ambulatory Visit: Payer: BC Managed Care – PPO

## 2022-05-25 DIAGNOSIS — R262 Difficulty in walking, not elsewhere classified: Secondary | ICD-10-CM

## 2022-05-25 DIAGNOSIS — M7672 Peroneal tendinitis, left leg: Secondary | ICD-10-CM | POA: Diagnosis not present

## 2022-05-25 DIAGNOSIS — M25572 Pain in left ankle and joints of left foot: Secondary | ICD-10-CM

## 2022-05-25 DIAGNOSIS — M19071 Primary osteoarthritis, right ankle and foot: Secondary | ICD-10-CM | POA: Diagnosis not present

## 2022-05-25 DIAGNOSIS — R2242 Localized swelling, mass and lump, left lower limb: Secondary | ICD-10-CM | POA: Diagnosis not present

## 2022-05-25 DIAGNOSIS — M25672 Stiffness of left ankle, not elsewhere classified: Secondary | ICD-10-CM

## 2022-06-01 ENCOUNTER — Ambulatory Visit: Payer: BC Managed Care – PPO | Admitting: Physical Therapy

## 2022-06-01 DIAGNOSIS — M25672 Stiffness of left ankle, not elsewhere classified: Secondary | ICD-10-CM

## 2022-06-01 DIAGNOSIS — M19071 Primary osteoarthritis, right ankle and foot: Secondary | ICD-10-CM | POA: Diagnosis not present

## 2022-06-01 DIAGNOSIS — R262 Difficulty in walking, not elsewhere classified: Secondary | ICD-10-CM | POA: Diagnosis not present

## 2022-06-01 DIAGNOSIS — M25572 Pain in left ankle and joints of left foot: Secondary | ICD-10-CM | POA: Diagnosis not present

## 2022-06-01 DIAGNOSIS — R2242 Localized swelling, mass and lump, left lower limb: Secondary | ICD-10-CM

## 2022-06-01 DIAGNOSIS — M7672 Peroneal tendinitis, left leg: Secondary | ICD-10-CM | POA: Diagnosis not present

## 2022-06-01 NOTE — Therapy (Signed)
OUTPATIENT PHYSICAL THERAPY LOWER EXTREMITY TREATMENT   Patient Name: Theresa Gonzalez MRN: [id number] DOB:25-Dec-1965, 56 y.o., female Today's Date: 06/01/2022   PT End of Session - 06/01/22 1006     Visit Number 3    Date for PT Re-Evaluation 07/11/22    Authorization Type BCBS    PT Start Time 1006    PT Stop Time 1050    PT Time Calculation (min) 44 min              Past Medical History:  Diagnosis Date   Angio-edema    Asthma    Pneumonia    PONV (postoperative nausea and vomiting)    Pre-diabetes    Recurrent upper respiratory infection (URI)    Urticaria    Past Surgical History:  Procedure Laterality Date   HERNIA REPAIR Bilateral    knee replacement Left 06/2019   LAPAROSCOPIC LYSIS OF ADHESIONS     TUBAL LIGATION     Patient Active Problem List   Diagnosis Date Noted   Peroneal tendinitis of left lower leg    Unilateral primary osteoarthritis, right knee 01/19/2022   Pain in left ankle and joints of left foot 01/19/2022   Acute medial meniscus tear of right knee 12/15/2021   Menopausal state 11/27/2021   Cystic disease of liver 07/08/2021   Mesenteric cyst 07/08/2021   History of kidney stones 07/02/2021   Arthritis of ankle or foot, degenerative, right 03/16/2021   Prediabetes 02/27/2021   Chronic rhinitis 02/26/2021   Gastroesophageal reflux disease 02/26/2021   DUB (dysfunctional uterine bleeding) 11/28/2020   Facet arthropathy, lumbar 11/28/2020   Mild intermittent asthma, uncomplicated 94/85/4627   RLS (restless legs syndrome) 11/28/2020   SK (seborrheic keratosis) 11/28/2020   Vitamin D insufficiency 11/28/2020   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 Yes: NPRS scale: 1/10 Pain location: left lateral ankle above the malleolous Pain description: nerve tingling. Dull ache Aggravating factors: stairs, uneven surfaces, pivot pain up to 5/10 Relieving factors: off feet pain can be 0/10  PRECAUTIONS: None  WEIGHT BEARING RESTRICTIONS: No  FALLS:  Has patient fallen in last 6 months? No  LIVING ENVIRONMENT: Lives with: lives with their family Lives in: House/apartment Stairs: Yes: Internal: 16 steps; can reach both Has following equipment at home: None  OCCUPATION: student  PLOF: Independent and was walking 2 miles 4 x/week, does a workout video as well  PATIENT GOALS: do stairs without issues, no pain, good strength  NEXT MD VISIT:   OBJECTIVE:   COGNITION: Overall cognitive status: Within functional limits for tasks assessed     SENSATION: C/o numbness and tingling in the left 5th toe and lateral foot  EDEMA:  Circumferential: right 23.5 cm, left 27 cm mid malleolous  PALPATION: Scar has some decreased mobility, palpation of the scar is causing increased tingling of the 5th toe, very tender and tight in the left PF, tender achilles, stiff toes  LOWER EXTREMITY ROM:  Active ROM Right eval Left eval Left 06/01/22  Hip  flexion     Hip extension     Hip abduction     Hip adduction     Hip internal rotation     Hip external rotation     Knee flexion     Knee extension     Ankle dorsiflexion  0 4  Ankle plantarflexion     Ankle inversion  23 40  Ankle eversion  2 11   (Blank rows = not tested)  LOWER EXTREMITY MMT:  MMT Right eval Left eval  Hip flexion    Hip extension    Hip abduction    Hip adduction    Hip internal rotation    Hip external rotation    Knee flexion     Knee extension    Ankle dorsiflexion  4  Ankle plantarflexion  4  Ankle inversion  4  Ankle eversion  4-P!   (Blank rows = not tested)  LOWER EXTREMITY SPECIAL TESTS:  Ankle special tests: SLS left 5-6 seconds  on airex she really struggled reports does not feel safe or stable, ankle  tended to want to roll laterally  FUNCTIONAL TESTS:  Timed up and go (TUG): 10 s  GAIT: Distance walked: 100 feet Assistive device utilized: None Level of assistance: Complete Independence Comments: pain at heel off/toe off, difficulty going down stairs at times has to do one step at a time, pain at heel off   TODAY'S TREATMENT:                                                                                                                              DATE:    06/01/22 Nustep L 5 5 min LE only Resisted gait 40# 4 way x 4  Calf raises on bar 2x10 , then toe raises Stand on airex step up and down 6 inch 10 x each foot Left foot on sit fit 10 x each DF/PF, INV/EV and cirlces ( some wt on RT LE as could not control without out) LLE 6 inch heel tap 2 sets 10 ( some left knee pain) Leg Press LLE only 20# 2 sets 10, calf raises BIL 2 sets 10 ( some left knee pain) Trampoline and foam beam activities Rocker board in // bars VASO for swelling 10 min left ankle       05/25/22 NuStep L5 x7mns  4 way ankle greenTB 2x10 Ankle rockers DF/PF, INV/EV x10 Gastroc stretch on slant 30s  Anterior heel taps 6" Step ups 6"  Walking on beam narrow and side steps  Tandem standing on foam 30s SL on foam 10s  Resisted gait 30# 4 way x 4  Calf raises on bar 2x10   PATIENT EDUCATION:  Education details: POC and HEP Person educated: Patient Education method: Explanation Education comprehension: verbalized understanding  HOME EXERCISE PROGRAM: Access Code: [email or web address]: [email or web address] Date: 05/11/2022 Prepared by: MLum Babe Exercises - Standing Gastroc Stretch on Step  with Counter Support  -  2 x daily - 7 x weekly - 2 sets - 5 reps - 20 hold - Standing Soleus Stretch  - 2 x daily - 7 x weekly - 2 sets - 5 reps - 20 hold - Standing Dorsiflexion Self-Mobilization on Step  - 2 x daily - 7 x weekly - 2 sets - 5 reps - 2 hold - CLX Ankle Dorsiflexion and Eversion  - 2 x daily - 7 x weekly - 2 sets - 10 reps - 3 hold  ASSESSMENT:  CLINICAL IMPRESSION: Pt with min ankle pain, did state uneven surface over weekend was painful. Some knee pain with SL ex. Progressed ex with cuing for control and ROM. Improved ROM and STG met OBJECTIVE IMPAIRMENTS: Abnormal gait, decreased activity tolerance, decreased balance, decreased endurance, decreased mobility, difficulty walking, decreased ROM, decreased strength, increased edema, increased muscle spasms, impaired flexibility, and pain.   REHAB POTENTIAL: Good  CLINICAL DECISION MAKING: Stable/uncomplicated  EVALUATION COMPLEXITY: Low   GOALS: Goals reviewed with patient? Yes  SHORT TERM GOALS: Target date: 05/25/22 Independent with initial HEP Goal status: met 06/01/22  LONG TERM GOALS: Target date: 08/03/22  Independent with advanced HE{P Goal status: INITIAL  2.  Go down stairs step over step without pain Goal status: INITIAL  3.  Increase DF to 10 degrees Goal status: INITIAL  4.  Increase left ankle eversion to 10 degrees Goal status: INITIAL  5.  Decrease pain 50% at end of day Baseline:  Goal status: INITIAL  PLAN:  PT FREQUENCY: 1-2x/week  PT DURATION: 12 weeks  PLANNED INTERVENTIONS: Therapeutic exercises, Therapeutic activity, Neuromuscular re-education, Balance training, Gait training, Patient/Family education, Self Care, Joint mobilization, Joint manipulation, Taping, Vasopneumatic device, and Manual therapy  PLAN FOR NEXT SESSION: work on the ROM, lateral strength and balance, she is fearful of uneven surfaces   Bocephus Cali,ANGIE, PTA 06/01/2022, 10:09 AM [address] [place], [place], [address] Phone: [phone]   Fax:  [phone]  Patient Details  Name: Theresa Gonzalez MRN: [id number] Date of Birth: [birth year] Referring Provider:  Haydee Salter, MD  Encounter Date: 06/01/2022   Laqueta Carina, PTA 06/01/2022, 10:09 AM  [address] [place], [place], [address] Phone: [phone]   Fax:  [phone]

## 2022-06-06 ENCOUNTER — Other Ambulatory Visit: Payer: Self-pay

## 2022-06-06 ENCOUNTER — Encounter (HOSPITAL_BASED_OUTPATIENT_CLINIC_OR_DEPARTMENT_OTHER): Payer: Self-pay | Admitting: Emergency Medicine

## 2022-06-06 ENCOUNTER — Emergency Department (HOSPITAL_BASED_OUTPATIENT_CLINIC_OR_DEPARTMENT_OTHER): Payer: BC Managed Care – PPO

## 2022-06-06 ENCOUNTER — Emergency Department (HOSPITAL_BASED_OUTPATIENT_CLINIC_OR_DEPARTMENT_OTHER)
Admission: EM | Admit: 2022-06-06 | Discharge: 2022-06-07 | Disposition: A | Discharge status: Home / Self Care | Payer: BC Managed Care – PPO | Attending: Emergency Medicine | Admitting: Emergency Medicine

## 2022-06-06 DIAGNOSIS — Z7951 Long term (current) use of inhaled steroids: Secondary | ICD-10-CM | POA: Insufficient documentation

## 2022-06-06 DIAGNOSIS — R1012 Left upper quadrant pain: Secondary | ICD-10-CM | POA: Insufficient documentation

## 2022-06-06 DIAGNOSIS — K7689 Other specified diseases of liver: Secondary | ICD-10-CM | POA: Diagnosis not present

## 2022-06-06 DIAGNOSIS — R1013 Epigastric pain: Secondary | ICD-10-CM | POA: Diagnosis not present

## 2022-06-06 DIAGNOSIS — R9431 Abnormal electrocardiogram [ECG] [EKG]: Secondary | ICD-10-CM | POA: Diagnosis not present

## 2022-06-06 DIAGNOSIS — K668 Other specified disorders of peritoneum: Secondary | ICD-10-CM | POA: Diagnosis not present

## 2022-06-06 DIAGNOSIS — K760 Fatty (change of) liver, not elsewhere classified: Secondary | ICD-10-CM | POA: Diagnosis not present

## 2022-06-06 DIAGNOSIS — J45909 Unspecified asthma, uncomplicated: Secondary | ICD-10-CM | POA: Insufficient documentation

## 2022-06-06 DIAGNOSIS — R1010 Upper abdominal pain, unspecified: Secondary | ICD-10-CM

## 2022-06-06 DIAGNOSIS — K6389 Other specified diseases of intestine: Secondary | ICD-10-CM | POA: Diagnosis not present

## 2022-06-06 DIAGNOSIS — N200 Calculus of kidney: Secondary | ICD-10-CM | POA: Diagnosis not present

## 2022-06-06 DIAGNOSIS — R1011 Right upper quadrant pain: Secondary | ICD-10-CM | POA: Diagnosis not present

## 2022-06-06 DIAGNOSIS — K3189 Other diseases of stomach and duodenum: Secondary | ICD-10-CM | POA: Diagnosis not present

## 2022-06-06 HISTORY — DX: Restless legs syndrome: G25.81

## 2022-06-06 LAB — URINALYSIS, MICROSCOPIC (REFLEX)

## 2022-06-06 LAB — COMPREHENSIVE METABOLIC PANEL
ALT: 21 U/L (ref 0–44)
AST: 24 U/L (ref 15–41)
Albumin: 4.2 g/dL (ref 3.5–5.0)
Alkaline Phosphatase: 95 U/L (ref 38–126)
Anion gap: 8 (ref 5–15)
BUN: 17 mg/dL (ref 6–20)
CO2: 22 mmol/L (ref 22–32)
Calcium: 9.3 mg/dL (ref 8.9–10.3)
Chloride: 109 mmol/L (ref 98–111)
Creatinine, Ser: 0.97 mg/dL (ref 0.44–1.00)
GFR, Estimated: >60 mL/min (ref >60)
Glucose, Bld: 92 mg/dL (ref 70–99)
Potassium: 3.7 mmol/L (ref 3.5–5.1)
Sodium: 139 mmol/L (ref 135–145)
Total Bilirubin: 0.6 mg/dL (ref 0.3–1.2)
Total Protein: 6.7 g/dL (ref 6.5–8.1)

## 2022-06-06 LAB — URINALYSIS, ROUTINE W REFLEX MICROSCOPIC
Bilirubin Urine: NEGATIVE
Glucose, UA: NEGATIVE mg/dL
Ketones, ur: NEGATIVE mg/dL
Leukocytes,Ua: NEGATIVE
Nitrite: NEGATIVE
Protein, ur: NEGATIVE mg/dL
Specific Gravity, Urine: 1.025 (ref 1.005–1.030)
pH: 5.5 (ref 5.0–8.0)

## 2022-06-06 LAB — CBC
HCT: 38.1 % (ref 36.0–46.0)
Hemoglobin: 12.9 g/dL (ref 12.0–15.0)
MCH: 28.7 pg (ref 26.0–34.0)
MCHC: 33.9 g/dL (ref 30.0–36.0)
MCV: 84.7 fL (ref 80.0–100.0)
Platelets: 285 10*3/uL (ref 150–400)
RBC: 4.5 MIL/uL (ref 3.87–5.11)
RDW: 13.2 % (ref 11.5–15.5)
WBC: 6.2 10*3/uL (ref 4.0–10.5)
nRBC: 0 % (ref 0.0–0.2)

## 2022-06-06 LAB — LIPASE, BLOOD: Lipase: 39 U/L (ref 11–51)

## 2022-06-06 LAB — TROPONIN I (HIGH SENSITIVITY): Troponin I (High Sensitivity): 3 ng/L (ref <18)

## 2022-06-06 MED ORDER — ALUM & MAG HYDROXIDE-SIMETH 200-200-20 MG/5ML PO SUSP
30.0000 mL | Freq: Once | ORAL | Status: AC
  Administered 2022-06-07: 30 mL via ORAL
  Filled 2022-06-06: qty 30

## 2022-06-06 MED ORDER — FENTANYL CITRATE PF 50 MCG/ML IJ SOSY
50.0000 ug | PREFILLED_SYRINGE | Freq: Once | INTRAMUSCULAR | Status: AC
  Administered 2022-06-07: 50 ug via INTRAVENOUS
  Filled 2022-06-06: qty 1

## 2022-06-06 MED ORDER — ONDANSETRON HCL 4 MG/2ML IJ SOLN
4.0000 mg | Freq: Once | INTRAMUSCULAR | Status: AC
  Administered 2022-06-07: 4 mg via INTRAVENOUS
  Filled 2022-06-06: qty 2

## 2022-06-06 NOTE — ED Triage Notes (Signed)
Patient states she was sitting on the couch and had a sudden onset of abdominal pain that goes across her diaphragm with nausea and weakness.

## 2022-06-06 NOTE — ED Provider Notes (Signed)
[facility] EMERGENCY DEPARTMENT Provider Note   CSN: [id number] Arrival date & time: 06/06/22  1930     History {Add pertinent medical, surgical, social history, OB history to HPI:1} Chief Complaint  Patient presents with   Abdominal Pain    Theresa Gonzalez is a 56 y.o. female.  The history is provided by the patient.  Abdominal Pain Pain location:  LUQ and epigastric Pain quality: pressure and sharp   Pain radiates to:  Does not radiate Pain severity now: sudden onset severe now a 2. Onset quality:  Sudden Duration:  5 hours Timing:  Constant Progression:  Improving Chronicity:  New Context comment:  At rest, ate crackers and cream cheese Relieved by:  Nothing Worsened by:  Nothing Ineffective treatments:  None tried Associated symptoms: no fever and no vomiting   Risk factors: no alcohol abuse       Past Medical History:  Diagnosis Date   Angio-edema    Asthma    Pneumonia    PONV (postoperative nausea and vomiting)    Pre-diabetes    Recurrent upper respiratory infection (URI)    Restless legs    Urticaria      Home Medications Prior to Admission medications   Medication Sig Start Date End Date Taking? Authorizing Provider  albuterol (VENTOLIN HFA) 108 (90 Base) MCG/ACT inhaler Inhale 2 puffs into the lungs every 6 (six) hours as needed for wheezing or shortness of breath (Cough). 05/07/22   Lynden Oxford Scales, PA-C  Berberine Chloride (BERBERINE HCI PO) Take 450 mg by mouth 2 (two) times daily.    [provider]  Cyanocobalamin (VITAMIN B-12) 5000 MCG SUBL Place 5,000 mcg under the tongue daily.    [provider]  estradiol (CLIMARA - DOSED IN MG/24 HR) 0.05 mg/24hr patch Place 0.5 mg onto the skin once a week. 02/24/22   [provider]  fenofibrate (TRICOR) 145 MG tablet Take 1 tablet (145 mg total) by mouth daily. 11/27/21   Haydee Salter, MD  fexofenadine (ALLEGRA) 180 MG tablet Take 1 tablet (180 mg total) by  mouth daily. 05/07/22 11/03/22  Lynden Oxford Scales, PA-C  fluticasone (FLONASE) 50 MCG/ACT nasal spray Place 1 spray into both nostrils daily. Begin by using 2 sprays in each nare daily for 3 to 5 days, then decrease to 1 spray in each nare daily. 05/07/22   Lynden Oxford Scales, PA-C  guaifenesin (HUMIBID E) 400 MG TABS tablet Take 1 tablet 3 times daily as needed for chest congestion and cough 05/17/22   Lynden Oxford Scales, PA-C  HYDROcodone-acetaminophen (NORCO/VICODIN) 5-325 MG tablet Take 1 tablet by mouth every 4 (four) hours as needed. 03/12/22   Newt Minion, MD  levocetirizine (XYZAL) 5 MG tablet Take 1 tablet (5 mg total) by mouth every evening. 01/12/21   Valentina Shaggy, MD  MAGNESIUM GLUCONATE PO Take 120 mg by mouth daily.    [provider]  Omega-3 Fatty Acids (OMEGA 3 PO) Take 770 mcg by mouth daily. Vegan    [provider]  omeprazole (PRILOSEC) 20 MG capsule Take 20 mg by mouth daily.    [provider]  ondansetron (ZOFRAN) 4 MG tablet Take 1 tablet (4 mg total) by mouth every 8 (eight) hours as needed for nausea or vomiting. 05/17/21   Mar Daring, PA-C  OVER THE COUNTER MEDICATION Take 306 mg by mouth daily. Eastergen control    [provider]  OVER THE COUNTER MEDICATION Take 1 capsule by  mouth daily. Thyroid Support    [provider]  OVER THE COUNTER MEDICATION Take 1 capsule by mouth daily. Mega D3 Mk7    [provider]  pramipexole (MIRAPEX) 1 MG tablet Take 1 tablet (1 mg total) by mouth at bedtime. 11/27/21   Haydee Salter, MD  Prasterone, DHEA, (DHEA PO) Take 5 mg by mouth daily.    [provider]  Pregnenolone Micronized (PREGNENOLONE PO) Take 75 mg by mouth daily.    [provider]  Probiotic Product (PROBIOTIC DAILY) CAPS Take 1 capsule by mouth daily.    [provider]  progesterone (PROMETRIUM) 100 MG capsule Take 300 mg by mouth at bedtime. 11/25/21    [provider]  promethazine-dextromethorphan (PROMETHAZINE-DM) 6.25-15 MG/5ML syrup Take 5 mLs by mouth 4 (four) times daily as needed for cough. 05/17/22   Lynden Oxford Scales, PA-C  thiamine (VITAMIN B-1) 100 MG tablet Take 100 mg by mouth daily.    [provider]  thyroid (ARMOUR) 30 MG tablet Take 30 mg by mouth every morning. 11/25/21   [provider]      Allergies    Penicillins, Potassium citrate, Ropinirole, Cetirizine hcl, Nabumetone, Nsaids, and Sodium hyaluronate    Review of Systems   Review of Systems  Constitutional:  Negative for fever.  HENT:  Negative for facial swelling.   Gastrointestinal:  Positive for abdominal pain. Negative for vomiting.  All other systems reviewed and are negative.   Physical Exam Updated Vital Signs BP 136/88 (BP Location: Right Arm)   Pulse 77   Temp 98.3 F (36.8 C) (Oral)   Resp 18   Ht 5\' 3"  (1.6 m)   Wt 74.8 kg   SpO2 99%   BMI 29.23 kg/m  Physical Exam Vitals and nursing note reviewed.  Constitutional:      General: She is not in acute distress.    Appearance: Normal appearance. She is well-developed. She is not diaphoretic.  HENT:     Head: Normocephalic and atraumatic.     Nose: Nose normal.  Eyes:     Pupils: Pupils are equal, round, and reactive to light.  Cardiovascular:     Rate and Rhythm: Normal rate and regular rhythm.     Pulses: Normal pulses.     Heart sounds: Normal heart sounds.  Pulmonary:     Effort: Pulmonary effort is normal. No respiratory distress.     Breath sounds: Normal breath sounds.  Abdominal:     General: Abdomen is flat. There is no distension.     Palpations: Abdomen is soft.     Tenderness: There is no abdominal tenderness. There is no guarding or rebound.     Comments: Hyperactive BS  Genitourinary:    Vagina: No vaginal discharge.  Musculoskeletal:        General: Normal range of motion.     Cervical back: Neck supple.  Skin:    General: Skin is  warm and dry.     Capillary Refill: Capillary refill takes less than 2 seconds.     Findings: No erythema or rash.  Neurological:     General: No focal deficit present.     Mental Status: She is alert and oriented to person, place, and time.     Deep Tendon Reflexes: Reflexes normal.  Psychiatric:        Mood and Affect: Mood normal.     ED Results / Procedures / Treatments   Labs (all labs ordered are listed,  but only abnormal results are displayed) Results for orders placed or performed during the hospital encounter of 06/06/22  Lipase, blood  Result Value Ref Range   Lipase 39 11 - 51 U/L  Comprehensive metabolic panel  Result Value Ref Range   Sodium 139 135 - 145 mmol/L   Potassium 3.7 3.5 - 5.1 mmol/L   Chloride 109 98 - 111 mmol/L   CO2 22 22 - 32 mmol/L   Glucose, Bld 92 70 - 99 mg/dL   BUN 17 6 - 20 mg/dL   Creatinine, Ser 4.09 0.44 - 1.00 mg/dL   Calcium 9.3 8.9 - 81.1 mg/dL   Total Protein 6.7 6.5 - 8.1 g/dL   Albumin 4.2 3.5 - 5.0 g/dL   AST 24 15 - 41 U/L   ALT 21 0 - 44 U/L   Alkaline Phosphatase 95 38 - 126 U/L   Total Bilirubin 0.6 0.3 - 1.2 mg/dL   GFR, Estimated >91 >47 mL/min   Anion gap 8 5 - 15  CBC  Result Value Ref Range   WBC 6.2 4.0 - 10.5 K/uL   RBC 4.50 3.87 - 5.11 MIL/uL   Hemoglobin 12.9 12.0 - 15.0 g/dL   HCT 82.9 56.2 - 13.0 %   MCV 84.7 80.0 - 100.0 fL   MCH 28.7 26.0 - 34.0 pg   MCHC 33.9 30.0 - 36.0 g/dL   RDW 86.5 78.4 - 69.6 %   Platelets 285 150 - 400 K/uL   nRBC 0.0 0.0 - 0.2 %  Urinalysis, Routine w reflex microscopic Urine, Clean Catch  Result Value Ref Range   Color, Urine YELLOW YELLOW   APPearance CLEAR CLEAR   Specific Gravity, Urine 1.025 1.005 - 1.030   pH 5.5 5.0 - 8.0   Glucose, UA NEGATIVE NEGATIVE mg/dL   Hgb urine dipstick TRACE (A) NEGATIVE   Bilirubin Urine NEGATIVE NEGATIVE   Ketones, ur NEGATIVE NEGATIVE mg/dL   Protein, ur NEGATIVE NEGATIVE mg/dL   Nitrite NEGATIVE NEGATIVE   Leukocytes,Ua NEGATIVE  NEGATIVE  Urinalysis, Microscopic (reflex)  Result Value Ref Range   RBC / HPF 0-5 0 - 5 RBC/hpf   WBC, UA 0-5 0 - 5 WBC/hpf   Bacteria, UA FEW (A) NONE SEEN   Squamous Epithelial / LPF 0-5 0 - 5  Troponin I (High Sensitivity)  Result Value Ref Range   Troponin I (High Sensitivity) 3 <18 ng/L   DG Chest 2 View  Result Date: 05/17/2022 CLINICAL DATA:  Cough EXAM: CHEST - 2 VIEW COMPARISON:  Chest x-ray dated July 30, 2021 FINDINGS: The heart size and mediastinal contours are within normal limits. Both lungs are clear. The visualized skeletal structures are unremarkable. IMPRESSION: No active cardiopulmonary disease. Electronically Signed   By: Allegra Lai M.D.   On: 05/17/2022 19:05    EKG EKG Interpretation  Date/Time:  Sunday June 06 2022 19:43:19 EST Ventricular Rate:  81 PR Interval:  156 QRS Duration: 70 QT Interval:  388 QTC Calculation: 450 R Axis:   -13 Text Interpretation: Normal sinus rhythm Confirmed by Nicanor Alcon, Shaneeka Scarboro ([id number]) on 06/06/2022 11:03:34 PM  Radiology No results found.  Procedures Procedures  {Document cardiac monitor, telemetry assessment procedure when appropriate:1}  Medications Ordered in ED Medications - No data to display  ED Course/ Medical Decision Making/ A&P                           Medical Decision Making Sudden onset upper  abdominal pain at rest.  Markedly improved   Amount and/or Complexity of Data Reviewed External Data Reviewed: notes.    Details: Previous notes reviewed  Labs: ordered.    Details: All labs reviewed: urine without UTI.  Troponin negative 3. Normal white count 6.2, normal hemoglobin 12.9, normal platelets 289.  Normal sodium 139, normal potassium 3.7, normal creatinine.  Normal LFTs Radiology: ordered and independent interpretation performed.    Details: No kidney stones by me  ECG/medicine tests: ordered and independent interpretation performed. Decision-making details documented in ED  Course.  Risk OTC drugs. Prescription drug management. Risk Details: Well appearing, no signs of infection.  Stable for discharge.  Follow up with PMD for ongoing care.  Strict return.     Final Clinical Impression(s) / ED Diagnoses Final diagnoses:  None   Transfer to [facility]

## 2022-06-06 NOTE — ED Notes (Signed)
Pt attempted to give urine sample at this time, pt was unable to do obtain. Will ask again at a later time.

## 2022-06-07 ENCOUNTER — Emergency Department (HOSPITAL_COMMUNITY): Payer: BC Managed Care – PPO

## 2022-06-07 DIAGNOSIS — K760 Fatty (change of) liver, not elsewhere classified: Secondary | ICD-10-CM | POA: Diagnosis not present

## 2022-06-07 DIAGNOSIS — R1011 Right upper quadrant pain: Secondary | ICD-10-CM | POA: Diagnosis not present

## 2022-06-07 DIAGNOSIS — K7689 Other specified diseases of liver: Secondary | ICD-10-CM | POA: Diagnosis not present

## 2022-06-07 LAB — TROPONIN I (HIGH SENSITIVITY): Troponin I (High Sensitivity): 2 ng/L (ref <18)

## 2022-06-07 LAB — D-DIMER, QUANTITATIVE: D-Dimer, Quant: 0.48 ug/mL-FEU (ref 0.00–0.50)

## 2022-06-07 MED ORDER — ONDANSETRON 4 MG PO TBDP: 4.0000 mg | ORAL_TABLET | Freq: Three times a day (TID) | ORAL | 0 refills | Status: DC | PRN

## 2022-06-07 NOTE — Discharge Instructions (Signed)
The cause of your symptoms was not identified today.  Please continue your current medications as prescribed.  Get rechecked if you have worsening symptoms or new concerning symptoms.

## 2022-06-07 NOTE — ED Notes (Signed)
Ultrasound at bedside

## 2022-06-07 NOTE — ED Provider Notes (Signed)
Patient here as a transfer from med [facility] for right upper quadrant ultrasound.  She initially presented for upper abdominal pain that is sharp and pressure-like sensation.  She had nausea but no vomiting.  She has a history of hyperlipidemia, restless leg syndrome.  She is on estrogen.  No history of DVT/PE.  No tobacco use.  Right upper quadrant ultrasound with trace fluid, no evidence of cholecystitis.  On examination abdomen is soft and nontender.  Discussed with patient unclear source of symptoms.  Current clinical picture is not consistent with SBP, cholecystitis.  No evidence of pancreatitis.  She is low risk for DVT/PE, D-dimer is negative-doubt PE.  Clinical picture is not consistent with ACS-troponin negative x2.  Discussed importance of outpatient follow-up as well as return precautions.   Tilden Fossa, MD 06/07/22 [phone]

## 2022-06-08 ENCOUNTER — Encounter: Payer: Self-pay | Admitting: Physical Therapy

## 2022-06-08 ENCOUNTER — Ambulatory Visit: Payer: BC Managed Care – PPO | Attending: Family | Admitting: Physical Therapy

## 2022-06-08 DIAGNOSIS — M25672 Stiffness of left ankle, not elsewhere classified: Secondary | ICD-10-CM | POA: Diagnosis not present

## 2022-06-08 DIAGNOSIS — M25572 Pain in left ankle and joints of left foot: Secondary | ICD-10-CM | POA: Diagnosis not present

## 2022-06-08 DIAGNOSIS — R262 Difficulty in walking, not elsewhere classified: Secondary | ICD-10-CM | POA: Diagnosis not present

## 2022-06-08 DIAGNOSIS — R2242 Localized swelling, mass and lump, left lower limb: Secondary | ICD-10-CM | POA: Diagnosis not present

## 2022-06-08 NOTE — Therapy (Signed)
OUTPATIENT PHYSICAL THERAPY LOWER EXTREMITY TREATMENT   Patient Name: Theresa Gonzalez MRN: [id number] DOB:[birth year], 56 y.o., female Today's Date: 06/08/2022   PT End of Session - 06/08/22 0801     Visit Number 4    Date for PT Re-Evaluation 07/11/22    Authorization Type BCBS    PT Start Time 0752    PT Stop Time 0841    PT Time Calculation (min) 49 min    Activity Tolerance Patient tolerated treatment well    Behavior During Therapy WFL for tasks assessed/performed              Past Medical History:  Diagnosis Date   Angio-edema    Asthma    Pneumonia    PONV (postoperative nausea and vomiting)    Pre-diabetes    Recurrent upper respiratory infection (URI)    Restless legs    Urticaria    Past Surgical History:  Procedure Laterality Date   ANKLE SURGERY     HERNIA REPAIR Bilateral    knee replacement Left 06/2019   LAPAROSCOPIC LYSIS OF ADHESIONS     TUBAL LIGATION     Patient Active Problem List   Diagnosis Date Noted   Peroneal tendinitis of left lower leg    Unilateral primary osteoarthritis, right knee 01/19/2022   Pain in left ankle and joints of left foot 01/19/2022   Acute medial meniscus tear of right knee 12/15/2021   Menopausal state 11/27/2021   Cystic disease of liver 07/08/2021   Mesenteric cyst 07/08/2021   History of kidney stones 07/02/2021   Arthritis of ankle or foot, degenerative, right 03/16/2021   Prediabetes 02/27/2021   Chronic rhinitis 02/26/2021   Gastroesophageal reflux disease 02/26/2021   DUB (dysfunctional uterine bleeding) 11/28/2020   Facet arthropathy, lumbar 11/28/2020   Mild intermittent asthma, uncomplicated 35/45/6256   RLS (restless legs syndrome) 11/28/2020   SK (seborrheic keratosis) 11/28/2020   Vitamin D insufficiency 11/28/2020   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, 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   Mixed hyperlipidemia 02/04/2011    PCP: Myrene Galas PROVIDER: Coralyn Pear, NP  REFERRING  DIAG: peroneal tendonitis  THERAPY DIAG:  Pain in left ankle and joints of left foot  Stiffness of left ankle, not elsewhere classified  Difficulty in walking, not elsewhere classified  Localized swelling, mass and lump, left lower limb  Rationale for Evaluation and Treatment: Rehabilitation  ONSET DATE: 03/12/22  SUBJECTIVE:   SUBJECTIVE STATEMENT: Patient went to the ED yesterday due to left upper quadrant pain, heart, pancrease were ruled out, she will see another MD on Friday to look further, reports ankle is okay, still swelling, no changes after the vaso PERTINENT HISTORY: See above PAIN:  Are you having pain? Yes: NPRS scale: 1/10 Pain location: left lateral ankle above the malleolous Pain description: nerve tingling. Dull ache Aggravating factors: stairs, uneven surfaces, pivot pain up to 5/10 Relieving factors: off feet pain can be 0/10  PRECAUTIONS: None  WEIGHT BEARING RESTRICTIONS: No  FALLS:  Has patient fallen in last 6 months? No  LIVING ENVIRONMENT: Lives with: lives with their family Lives in: House/apartment Stairs: Yes: Internal: 16 steps; can reach both Has following equipment at home: None  OCCUPATION: student  PLOF: Independent and was walking 2 miles 4 x/week, does a workout video as well  PATIENT GOALS: do stairs without issues, no pain, good strength  NEXT MD VISIT:   OBJECTIVE:   COGNITION: Overall cognitive status: Within functional limits for tasks assessed  SENSATION: C/o numbness and tingling in the left 5th toe and lateral foot  EDEMA:  Circumferential: right 23.5 cm, left 27 cm mid malleolous  PALPATION: Scar has some decreased mobility, palpation of the scar is causing increased tingling of the 5th toe, very tender and tight in the left PF, tender achilles, stiff toes  LOWER EXTREMITY ROM:  Active ROM Right eval Left eval Left 06/01/22  Hip flexion     Hip extension     Hip abduction     Hip adduction     Hip  internal rotation     Hip external rotation     Knee flexion     Knee extension     Ankle dorsiflexion  0 4  Ankle plantarflexion     Ankle inversion  23 40  Ankle eversion  2 11   (Blank rows = not tested)  LOWER EXTREMITY MMT:  MMT Right eval Left eval  Hip flexion    Hip extension    Hip abduction    Hip adduction    Hip internal rotation    Hip external rotation    Knee flexion    Knee extension    Ankle dorsiflexion  4  Ankle plantarflexion  4  Ankle inversion  4  Ankle eversion  4-P!   (Blank rows = not tested)  LOWER EXTREMITY SPECIAL TESTS:  Ankle special tests: SLS left 5-6 seconds  on airex she really struggled reports does not feel safe or stable, ankle  tended to want to roll laterally  FUNCTIONAL TESTS:  Timed up and go (TUG): 10 s  GAIT: Distance walked: 100 feet Assistive device utilized: None Level of assistance: Complete Independence Comments: pain at heel off/toe off, difficulty going down stairs at times has to do one step at a time, pain at heel off   TODAY'S TREATMENT:                                                                                                                              DATE:   06/08/22 Nustep level 5 x 6 mintues LE only Resisted gait all directions  Seated sit fit ankle motions Red tband ankle motions 2x10 all Gastroc and soleus stretches Airex balance beam side stepping and tandem walking SLS on airex, airex ball toss Towel scrunches Tow stretches  06/01/22 Nustep L 5 5 min LE only Resisted gait 40# 4 way x 4  Calf raises on bar 2x10 , then toe raises Stand on airex step up and down 6 inch 10 x each foot Left foot on sit fit 10 x each DF/PF, INV/EV and cirlces ( some wt on RT LE as could not control without out) LLE 6 inch heel tap 2 sets 10 ( some left knee pain) Leg Press LLE only 20# 2 sets 10, calf raises BIL 2 sets 10 ( some left knee pain) Trampoline and foam beam activities Rocker board in //  bars VASO  for swelling 10 min left ankle  05/25/22 NuStep L5 x19mns  4 way ankle greenTB 2x10 Ankle rockers DF/PF, INV/EV x10 Gastroc stretch on slant 30s  Anterior heel taps 6" Step ups 6"  Walking on beam narrow and side steps  Tandem standing on foam 30s SL on foam 10s  Resisted gait 30# 4 way x 4  Calf raises on bar 2x10   PATIENT EDUCATION:  Education details: POC and HEP Person educated: Patient Education method: Explanation Education comprehension: verbalized understanding  HOME EXERCISE PROGRAM: Access Code: [email or web address]: [email or web address] Date: 05/11/2022 Prepared by: MLum Babe Exercises - Standing Gastroc Stretch on Step with Counter Support  - 2 x daily - 7 x weekly - 2 sets - 5 reps - 20 hold - Standing Soleus Stretch  - 2 x daily - 7 x weekly - 2 sets - 5 reps - 20 hold - Standing Dorsiflexion Self-Mobilization on Step  - 2 x daily - 7 x weekly - 2 sets - 5 reps - 2 hold - CLX Ankle Dorsiflexion and Eversion  - 2 x daily - 7 x weekly - 2 sets - 10 reps - 3 hold  ASSESSMENT:  CLINICAL IMPRESSION: Pt with min ankle pain, She has better ROM and has better coordination, she still has difficulty with going down stairs OBJECTIVE IMPAIRMENTS: Abnormal gait, decreased activity tolerance, decreased balance, decreased endurance, decreased mobility, difficulty walking, decreased ROM, decreased strength, increased edema, increased muscle spasms, impaired flexibility, and pain.   REHAB POTENTIAL: Good  CLINICAL DECISION MAKING: Stable/uncomplicated  EVALUATION COMPLEXITY: Low   GOALS: Goals reviewed with patient? Yes  SHORT TERM GOALS: Target date: 05/25/22 Independent with initial HEP Goal status: met 06/01/22  LONG TERM GOALS: Target date: 08/03/22  Independent with advanced HE{P Goal status: INITIAL  2.  Go down stairs step over step without pain Goal status: progressing  3.  Increase DF to 10 degrees Goal status:  progressing  4.  Increase left ankle eversion to 10 degrees Goal status: progressing  5.  Decrease pain 50% at end of day Baseline:  Goal status: progressing  PLAN:  PT FREQUENCY: 1-2x/week  PT DURATION: 12 weeks  PLANNED INTERVENTIONS: Therapeutic exercises, Therapeutic activity, Neuromuscular re-education, Balance training, Gait training, Patient/Family education, Self Care, Joint mobilization, Joint manipulation, Taping, Vasopneumatic device, and Manual therapy  PLAN FOR NEXT SESSION: work on the ROM, lateral strength and balance, she is fearful of uneven surfaces   ASumner Boast PT 06/08/2022, 8:02 AM [place] [id number]: [phone]  Fax:  [phone]

## 2022-06-09 ENCOUNTER — Telehealth: Payer: Self-pay

## 2022-06-09 NOTE — Telephone Encounter (Signed)
Transition Care Management Unsuccessful Follow-up Telephone Call  Date of discharge and from where:  06/07/2022 [facility] ED. Dx: Upper Abdomen Pain  Attempts:  1st Attempt  Reason for unsuccessful TCM follow-up call:  Left voice message

## 2022-06-10 ENCOUNTER — Telehealth: Payer: Self-pay

## 2022-06-10 NOTE — Telephone Encounter (Signed)
Cirigliano, Verlin Dike DO  Orion Modest, RN  ER chart reviewed. Please call to check in on her and see how she is feeling today. To keep f/u appt later this week w/ me as scheduled.

## 2022-06-10 NOTE — Telephone Encounter (Signed)
Transition Care Management Unsuccessful Follow-up Telephone Call  Date of discharge and from where:  06/07/22 [facility] ED. Dx: Upper Abdominal Pain.  Attempts:  2nd Attempt  Reason for unsuccessful TCM follow-up call:  Left voice message

## 2022-06-10 NOTE — Telephone Encounter (Signed)
Called to check in on patient & she says her pain has improved since being discharged from ED. She still has some discomfort in the LUQ & experiences nausea w/o vomiting at least 4 days out of the week when she first wakes up. Normal bowel movements. She's currently on omeprazole daily & has zofran prn, however it makes her drowsy so she doesn't take it often. She plans to keep OV tomorrow with Dr. Barron Alvine, and would like to discuss with him about potential EGD and parasite testing since she lived in [place] for 8 years.

## 2022-06-11 ENCOUNTER — Encounter: Payer: Self-pay | Admitting: Gastroenterology

## 2022-06-11 ENCOUNTER — Ambulatory Visit (INDEPENDENT_AMBULATORY_CARE_PROVIDER_SITE_OTHER): Payer: BC Managed Care – PPO | Admitting: Gastroenterology

## 2022-06-11 VITALS — BP 116/74 | HR 65 | Ht 63.0 in | Wt 169.0 lb

## 2022-06-11 DIAGNOSIS — R112 Nausea with vomiting, unspecified: Secondary | ICD-10-CM

## 2022-06-11 DIAGNOSIS — R1013 Epigastric pain: Secondary | ICD-10-CM

## 2022-06-11 DIAGNOSIS — K219 Gastro-esophageal reflux disease without esophagitis: Secondary | ICD-10-CM

## 2022-06-11 DIAGNOSIS — K668 Other specified disorders of peritoneum: Secondary | ICD-10-CM | POA: Diagnosis not present

## 2022-06-11 DIAGNOSIS — R1012 Left upper quadrant pain: Secondary | ICD-10-CM

## 2022-06-11 NOTE — Progress Notes (Signed)
Chief Complaint:    Abdominal pain, ER follow-up  GI History: 56 year old female with a history as below, initially seen in the GI clinic on 08/14/2021 for evaluation of RUQ/right flank pain.  -07/08/2021: CT AP: 3.7 cm density in central mesentery.  Recommend MRI with/without contrast and if solid, plan for biopsy or PET scan.  Benign liver cysts. - 07/24/2021: Abdominal ultrasound: Benign left hepatic cysts - 07/28/2021: MRI abdomen: Lesion of the central small bowel mesentery only partially included on this examination of the abdomen.  Appears to be thinly septated cyst without associated contrast enhancement or solid component.  Most likely reflects benign mesenteric cyst.  Multiple benign liver cysts, hepatic steatosis, small hiatal hernia - 07/30/2021: Was seen at [facility] and sent to ER for evaluation of right-sided abdominal pain - 07/30/2021: ER evaluation for right-sided abdominal pain.  HD stable.  Normal CBC, CMP, lipase.  Normal CXR - 07/30/2021: CT AP: Hepatic cysts without duct dilation.  Water density cystic lesion in central pelvis measuring 3.3 x 2.5 cm without enhancing nodularity, c/w mesenteric cyst. - 08/14/2021: Initial appointment in GI clinic for evaluation of RUQ/right flank pain.  Diagnosed with rib tip syndrome/costochondritis and recommended course of NSAIDs, conservative management.  Consideration for EGD and/or HIDA if no improvement. Sxs resolved.  - 04/23/2022: MRI abdomen: Mild hepatic steatosis with several small benign-appearing hepatic cysts.  3.2 x 2.8 cm cystic lesion in the central small bowel mesentery just left of midline.  Contains several thin internal septations, but no thick septation or soft tissue component.  No change compared with previous exams.  Uterine fibroids.  Recommended repeat CT abdomen/pelvis in 9-12 months for surveillance, and if again stable, no continued surveillance needed   Endoscopic history: - Colonoscopy (12/2018): No report  available for review, but normal per patient   HPI:     Patient is a 56 y.o. female presenting to the Gastroenterology Clinic for evaluation of RUQ pain.  Pain started acutely Sunday, in epigastrium and LUQ>RUQ. Nausea w/o emesis.  No hematemesis, melena, change in bowel habits.  Pain is completely different from her last evaluation in the office by me which was more diagnostic of costochondritis and MSK pain.  She presented to the [facility] ER on 06/06/2022 for evaluation.  Was ultimately transferred to [facility] ER for further evaluation. - Normal CBC, CMP, lipase, troponin, UA, D-dimer - CT renal protocol: Punctate bilateral nonobstructing renal calculi.  Mild fatty liver, several liver cysts without duct dilatation.  Apparent GB wall thickening without cholelithiasis.  1.6 x 3 cm cystic lesion in the mesentery, stable from previous - RUQ [place]: Normal GB, no cholelithiasis.  Fatty liver infiltration with hepatic cysts. - Symptoms started to improve and was discharged home  Pain somewhat improving now.  Of note, she had ankle surgery on 03/12/2022, and had been taking APAP and Motrin Q8 hours x2 months, then prn.  GERD o/w well controlled w/ omeprazole 20 mg/day.        Review of systems:     No chest pain, no SOB, no fevers, no urinary sx   Past Medical History:  Diagnosis Date   Angio-edema    Asthma    Pneumonia    PONV (postoperative nausea and vomiting)    Pre-diabetes    Recurrent upper respiratory infection (URI)    Restless legs    Urticaria     Patient's surgical history, family medical history, social history, medications and allergies were all reviewed in Epic  Current Outpatient Medications  Medication Sig Dispense Refill   estradiol (CLIMARA - DOSED IN MG/24 HR) 0.05 mg/24hr patch Place 0.5 mg onto the skin once a week.     fenofibrate (TRICOR) 145 MG tablet Take 1 tablet (145 mg total) by mouth daily. 90 tablet 3   MAGNESIUM GLUCONATE PO Take 120 mg by  mouth daily.     omeprazole (PRILOSEC) 20 MG capsule Take 20 mg by mouth daily.     ondansetron (ZOFRAN) 4 MG tablet Take 1 tablet (4 mg total) by mouth every 8 (eight) hours as needed for nausea or vomiting. 20 tablet 0   ondansetron (ZOFRAN-ODT) 4 MG disintegrating tablet Take 1 tablet (4 mg total) by mouth every 8 (eight) hours as needed for nausea or vomiting. 12 tablet 0   pramipexole (MIRAPEX) 1 MG tablet Take 1 tablet (1 mg total) by mouth at bedtime. 90 tablet 3   Prasterone, DHEA, (DHEA PO) Take 5 mg by mouth daily.     Pregnenolone Micronized (PREGNENOLONE PO) Take 75 mg by mouth daily.     Probiotic Product (PROBIOTIC DAILY) CAPS Take 1 capsule by mouth daily.     progesterone (PROMETRIUM) 100 MG capsule Take 300 mg by mouth at bedtime.     No current facility-administered medications for this visit.    Physical Exam:     BP 116/74   Pulse 65   Ht 5\' 3"  (1.6 m)   Wt 169 lb (76.7 kg)   BMI 29.94 kg/m   GENERAL:  Pleasant female in NAD PSYCH: : Cooperative, normal affect EENT:  conjunctiva pink, mucous membranes moist, neck supple without masses CARDIAC:  RRR, no murmur heard, no peripheral edema PULM: Normal respiratory effort, lungs CTA bilaterally, no wheezing ABDOMEN: Mild TTP in epigastrium and LUQ without rebound, guarding, peritoneal signs.  Nondistended, soft.  No obvious masses, no hepatomegaly,  normal bowel sounds SKIN:  turgor, no lesions seen Musculoskeletal:  Normal muscle tone, normal strength.  No costochondritis or rib pain NEURO: Alert and oriented x 3, no focal neurologic deficits   IMPRESSION and PLAN:    1) Epigastric pain 2) LUQ pain 3) Nausea without emesis Acute onset upper abdominal pain with nausea without emesis.  Discussed broad DDx to include possible PUD, gastritis, H. pylori, atypical GERD, and less likely biliary etiology. - Expedited EGD on 12/11 to evaluate for mucosal/luminal pathology with random and directed biopsies - Holding  NSAIDs - Resume PPI as prescribed - If EGD unrevealing and symptoms persist/return, plan for HIDA  4) GERD - Reflux symptoms otherwise well-controlled with current therapy - Continue Prilosec - Continue antireflux lifestyle/dietary modifications - Evaluate for erosive esophagitis, hiatal hernia, LES laxity at time of EGD as above  5) Mesenteric cyst - CT abdomen/pelvis in 6-9 months.  If unchanged, likely no need for continued surveillance imaging   The indications, risks, and benefits of EGD were explained to the patient in detail. Risks include but are not limited to bleeding, perforation, adverse reaction to medications, and cardiopulmonary compromise. Sequelae include but are not limited to the possibility of surgery, hospitalization, and mortality. The patient verbalized understanding and wished to proceed. All questions answered, referred to scheduler. Further recommendations pending results of the exam.        Lavena Bullion ,DO, FACG 06/11/2022, 8:56 AM

## 2022-06-11 NOTE — Telephone Encounter (Signed)
Transition Care Management Unsuccessful Follow-up Telephone Call  Date of discharge and from where:  06/07/22 [facility] ED. Dx: Upper Abdominal Pain  Attempts:  3rd Attempt  Reason for unsuccessful TCM follow-up call:  Left voice message

## 2022-06-11 NOTE — Patient Instructions (Addendum)
You have been scheduled for an endoscopy. Please follow written instructions given to you at your visit today. If you use inhalers (even only as needed), please bring them with you on the day of your procedure.  If you are age 57 or younger, your body mass index should be between 19-25. Your Body mass index is 29.94 kg/m. If this is out of the aformentioned range listed, please consider follow up with your Primary Care Provider.   __________________________________________________________  The [place] [facility] providers would like to encourage you to use [facility] to communicate with providers for non-urgent requests or questions.  Due to long hold times on the telephone, sending your provider a message by [facility] may be a faster and more efficient way to get a response.  Please allow 48 business hours for a response.  Please remember that this is for non-urgent requests.   The [place] [facility] providers would like to encourage you to use [facility] to communicate with providers for non-urgent requests or questions.  Due to long hold times on the telephone, sending your provider a message by [facility] may be a faster and more efficient way to get a response.  Please allow 48 business hours for a response.  Please remember that this is for non-urgent requests.   Due to recent changes in healthcare laws, you may see the results of your imaging and laboratory studies on MyChart before your provider has had a chance to review them.  We understand that in some cases there may be results that are confusing or concerning to you. Not all laboratory results come back in the same time frame and the provider may be waiting for multiple results in order to interpret others.  Please give [place] 48 hours in order for your provider to thoroughly review all the results before contacting the office for clarification of your results.     Thank you for choosing me and [facility].  Vito Cirigliano, D.O.

## 2022-06-14 ENCOUNTER — Ambulatory Visit (AMBULATORY_SURGERY_CENTER): Payer: BC Managed Care – PPO | Admitting: Gastroenterology

## 2022-06-14 ENCOUNTER — Encounter: Payer: Self-pay | Admitting: Gastroenterology

## 2022-06-14 VITALS — BP 118/61 | HR 77 | Temp 97.8°F | Resp 14 | Ht 63.0 in | Wt 169.0 lb

## 2022-06-14 DIAGNOSIS — R1013 Epigastric pain: Secondary | ICD-10-CM

## 2022-06-14 DIAGNOSIS — K449 Diaphragmatic hernia without obstruction or gangrene: Secondary | ICD-10-CM | POA: Diagnosis not present

## 2022-06-14 DIAGNOSIS — R1012 Left upper quadrant pain: Secondary | ICD-10-CM

## 2022-06-14 DIAGNOSIS — R110 Nausea: Secondary | ICD-10-CM

## 2022-06-14 DIAGNOSIS — K219 Gastro-esophageal reflux disease without esophagitis: Secondary | ICD-10-CM | POA: Diagnosis not present

## 2022-06-14 MED ORDER — SODIUM CHLORIDE 0.9 % IV SOLN: 500.0000 mL | Freq: Once | INTRAVENOUS | Status: DC

## 2022-06-14 NOTE — Progress Notes (Signed)
Called to room to assist during endoscopic procedure.  Patient ID and intended procedure confirmed with present staff. Received instructions for my participation in the procedure from the performing physician.  

## 2022-06-14 NOTE — Op Note (Signed)
[facility] Patient Name: Theresa Gonzalez Procedure Date: 06/14/2022 10:04 AM MRN: [id number] Endoscopist: Doristine Locks , MD, [id number] Age: 56 Referring MD:  Date of Birth: 1965-08-31 Gender: Female Account #: [id number] Procedure:                Upper GI endoscopy Indications:              Epigastric abdominal pain, Abdominal pain in the                            left upper quadrant, Esophageal reflux, Nausea Medicines:                Monitored Anesthesia Care Procedure:                Pre-Anesthesia Assessment:                           - Prior to the procedure, a History and Physical                            was performed, and patient medications and                            allergies were reviewed. The patient's tolerance of                            previous anesthesia was also reviewed. The risks                            and benefits of the procedure and the sedation                            options and risks were discussed with the patient.                            All questions were answered, and informed consent                            was obtained. Prior Anticoagulants: The patient has                            taken no anticoagulant or antiplatelet agents. ASA                            Grade Assessment: II - A patient with mild systemic                            disease. After reviewing the risks and benefits,                            the patient was deemed in satisfactory condition to                            undergo the procedure.  After obtaining informed consent, the endoscope was                            passed under direct vision. Throughout the                            procedure, the patient's blood pressure, pulse, and                            oxygen saturations were monitored continuously. The                            GIF [id number] #[id number] was introduced through the                            mouth,  and advanced to the third part of duodenum.                            The upper GI endoscopy was accomplished without                            difficulty. The patient tolerated the procedure                            well. Scope In: Scope Out: Findings:                 The upper third of the esophagus, middle third of                            the esophagus and lower third of the esophagus were                            normal.                           A 4 cm hiatal hernia was present.                           The entire examined stomach was normal. The pylorus                            was patent and easily traversed. Biopsies were                            taken with a cold forceps for Helicobacter pylori                            testing. Estimated blood loss was minimal.                           The examined duodenum was normal. Complications:            No immediate complications. Estimated Blood Loss:     Estimated blood loss was minimal. Impression:               -  Normal upper third of esophagus, middle third of                            esophagus and lower third of esophagus.                           - 4 cm hiatal hernia.                           - Normal stomach. Biopsied.                           - Normal examined duodenum. Recommendation:           - Patient has a contact number available for                            emergencies. The signs and symptoms of potential                            delayed complications were discussed with the                            patient. Return to normal activities tomorrow.                            Written discharge instructions were provided to the                            patient.                           - Resume previous diet.                           - Continue present medications.                           - Await pathology results.                           - Return to GI clinic PRN.                            - If symptoms recur/persist, can further evaluate                            with HIDA scan. Doristine Locks, MD 06/14/2022 10:25:38 AM

## 2022-06-14 NOTE — Patient Instructions (Signed)
Handout on hiatal hernia given to patient. Await pathology results of biopsies taken today. Return to GI clinic as needed.  If symptoms recur/persist, can further evaluate with HIDA scan. Resume previous diet and continue present medications.    YOU HAD AN ENDOSCOPIC PROCEDURE TODAY AT THE [facility]:   Refer to the procedure report that was given to you for any specific questions about what was found during the examination.  If the procedure report does not answer your questions, please call your gastroenterologist to clarify.  If you requested that your care partner not be given the details of your procedure findings, then the procedure report has been included in a sealed envelope for you to review at your convenience later.  YOU SHOULD EXPECT: Some feelings of bloating in the abdomen. Passage of more gas than usual.  Walking can help get rid of the air that was put into your GI tract during the procedure and reduce the bloating. If you had a lower endoscopy (such as a colonoscopy or flexible sigmoidoscopy) you may notice spotting of blood in your stool or on the toilet paper. If you underwent a bowel prep for your procedure, you may not have a normal bowel movement for a few days.  Please Note:  You might notice some irritation and congestion in your nose or some drainage.  This is from the oxygen used during your procedure.  There is no need for concern and it should clear up in a day or so.  SYMPTOMS TO REPORT IMMEDIATELY:  Following upper endoscopy (EGD)  Vomiting of blood or coffee ground material  New chest pain or pain under the shoulder blades  Painful or persistently difficult swallowing  New shortness of breath  Fever of 100F or higher  Black, tarry-looking stools  For urgent or emergent issues, a gastroenterologist can be reached at any hour by calling [phone]. Do not use MyChart messaging for urgent concerns.    DIET:  We do recommend a small meal at  first, but then you may proceed to your regular diet.  Drink plenty of fluids but you should avoid alcoholic beverages for 24 hours.  ACTIVITY:  You should plan to take it easy for the rest of today and you should NOT DRIVE or use heavy machinery until tomorrow (because of the sedation medicines used during the test).    FOLLOW UP: Our staff will call the number listed on your records the next business day following your procedure.  We will call around 7:15- 8:00 am to check on you and address any questions or concerns that you may have regarding the information given to you following your procedure. If we do not reach you, we will leave a message.     If any biopsies were taken you will be contacted by phone or by letter within the next 1-3 weeks.  Please call us at [phone] if you have not heard about the biopsies in 3 weeks.    SIGNATURES/CONFIDENTIALITY: You and/or your care partner have signed paperwork which will be entered into your electronic medical record.  These signatures attest to the fact that that the information above on your After Visit Summary has been reviewed and is understood.  Full responsibility of the confidentiality of this discharge information lies with you and/or your care-partner.

## 2022-06-14 NOTE — Progress Notes (Signed)
Sedate, gd SR, tolerated procedure well, VSS, report to RN 

## 2022-06-14 NOTE — Progress Notes (Signed)
GASTROENTEROLOGY PROCEDURE H&P NOTE   Primary Care Physician: Loyola Mast, MD    Reason for Procedure:   LUQ, MEG, RUQ pain, nausea  Plan:    EGD  Patient is appropriate for endoscopic procedure(s) in the ambulatory ([facility]) setting.  The nature of the procedure, as well as the risks, benefits, and alternatives were carefully and thoroughly reviewed with the patient. Ample time for discussion and questions allowed. The patient understood, was satisfied, and agreed to proceed.     HPI: Theresa Gonzalez is a 56 y.o. female who presents for EGD for evaluation of upper abdominal pain.  Patient was most recently seen in the Gastroenterology Clinic on 06/11/2022 by me.  No interval change in medical history since that appointment. Please refer to that note for full details regarding GI history and clinical presentation.   Past Medical History:  Diagnosis Date   Angio-edema    Asthma    Pneumonia    PONV (postoperative nausea and vomiting)    Pre-diabetes    Recurrent upper respiratory infection (URI)    Restless legs    Urticaria     Past Surgical History:  Procedure Laterality Date   ANKLE SURGERY     HERNIA REPAIR Bilateral    knee replacement Left 06/2019   LAPAROSCOPIC LYSIS OF ADHESIONS     TUBAL LIGATION      Prior to Admission medications   Medication Sig Start Date End Date Taking? Authorizing Provider  estradiol (CLIMARA - DOSED IN MG/24 HR) 0.05 mg/24hr patch Place 0.5 mg onto the skin once a week. 02/24/22  Yes [provider]  fenofibrate (TRICOR) 145 MG tablet Take 1 tablet (145 mg total) by mouth daily. 11/27/21  Yes Loyola Mast, MD  MAGNESIUM GLUCONATE PO Take 120 mg by mouth daily.   Yes [provider]  omeprazole (PRILOSEC) 20 MG capsule Take 20 mg by mouth daily.   Yes [provider]  ondansetron (ZOFRAN) 4 MG tablet Take 1 tablet (4 mg total) by mouth every 8 (eight) hours as needed for nausea or vomiting. 05/17/21  Yes  [name], PA-C  ondansetron (ZOFRAN-ODT) 4 MG disintegrating tablet Take 1 tablet (4 mg total) by mouth every 8 (eight) hours as needed for nausea or vomiting. 06/07/22  Yes Tilden Fossa, MD  pramipexole (MIRAPEX) 1 MG tablet Take 1 tablet (1 mg total) by mouth at bedtime. 11/27/21  Yes Loyola Mast, MD  Pregnenolone Micronized (PREGNENOLONE PO) Take 75 mg by mouth daily.   Yes [provider]  Probiotic Product (PROBIOTIC DAILY) CAPS Take 1 capsule by mouth daily.   Yes [provider]  progesterone (PROMETRIUM) 100 MG capsule Take 300 mg by mouth at bedtime. 11/25/21  Yes [provider]  Prasterone, DHEA, (DHEA PO) Take 5 mg by mouth daily.    [provider]    Current Outpatient Medications  Medication Sig Dispense Refill   estradiol (CLIMARA - DOSED IN MG/24 HR) 0.05 mg/24hr patch Place 0.5 mg onto the skin once a week.     fenofibrate (TRICOR) 145 MG tablet Take 1 tablet (145 mg total) by mouth daily. 90 tablet 3   MAGNESIUM GLUCONATE PO Take 120 mg by mouth daily.     omeprazole (PRILOSEC) 20 MG capsule Take 20 mg by mouth daily.     ondansetron (ZOFRAN) 4 MG tablet Take 1 tablet (4 mg total) by mouth every 8 (eight) hours as needed for nausea or vomiting. 20 tablet 0  ondansetron (ZOFRAN-ODT) 4 MG disintegrating tablet Take 1 tablet (4 mg total) by mouth every 8 (eight) hours as needed for nausea or vomiting. 12 tablet 0   pramipexole (MIRAPEX) 1 MG tablet Take 1 tablet (1 mg total) by mouth at bedtime. 90 tablet 3   Pregnenolone Micronized (PREGNENOLONE PO) Take 75 mg by mouth daily.     Probiotic Product (PROBIOTIC DAILY) CAPS Take 1 capsule by mouth daily.     progesterone (PROMETRIUM) 100 MG capsule Take 300 mg by mouth at bedtime.     Prasterone, DHEA, (DHEA PO) Take 5 mg by mouth daily.     Current Facility-Administered Medications  Medication Dose Route Frequency Provider Last Rate Last Admin   0.9 %  sodium chloride  infusion  500 mL Intravenous Once Lanie Schelling V, DO        Allergies as of 06/14/2022 - Review Complete 06/14/2022  Allergen Reaction Noted   Penicillins Shortness Of Breath, Swelling, and Rash 05/21/2014   Potassium citrate Itching 06/19/2019   Ropinirole Shortness Of Breath 02/03/2011   Cetirizine hcl Other (See Comments) 02/03/2011   Nabumetone Rash 06/14/2011   Nsaids Rash 07/01/2011   Sodium hyaluronate Hives 03/23/2011    Family History  Problem Relation Age of Onset   Hypertension Mother    Hyperlipidemia Mother    Retinitis pigmentosa Mother    Heart disease Father    Diabetes Father    Prostate cancer Father    Cancer - Prostate Father    Diabetes Brother    Heart attack Maternal Grandmother    Stroke Maternal Grandfather    Heart attack Maternal Grandfather    Heart disease Paternal Grandmother    Epilepsy Paternal Grandmother    Diabetes Paternal Grandfather    Retinitis pigmentosa Maternal Aunt    Allergic rhinitis Neg Hx    Asthma Neg Hx    Eczema Neg Hx    Urticaria Neg Hx    Colon cancer Neg Hx    Rectal cancer Neg Hx    Stomach cancer Neg Hx    Pancreatic cancer Neg Hx    Liver cancer Neg Hx     Social History   Socioeconomic History   Marital status: Married    Spouse name: Not on file   Number of children: 2   Years of education: Not on file   Highest education level: Not on file  Occupational History   Occupation: Consulting civil engineer  Tobacco Use   Smoking status: Never   Smokeless tobacco: Never  Vaping Use   Vaping Use: Never used  Substance and Sexual Activity   Alcohol use: Yes    Comment: socially   Drug use: Never   Sexual activity: Yes  Other Topics Concern   Not on file  Social History Narrative   Not on file   Social Determinants of Health   Financial Resource Strain: Not on file  Food Insecurity: Not on file  Transportation Needs: Not on file  Physical Activity: Not on file  Stress: Not on file  Social Connections: Not on  file  Intimate Partner Violence: Not on file    Physical Exam: Vital signs in last 24 hours: @BP  134/67 (BP Location: Right Arm, Patient Position: Sitting, Cuff Size: Normal)   Pulse 64   Temp 97.8 F (36.6 C) (Temporal)   Ht 5\' 3"  (1.6 m)   Wt 169 lb (76.7 kg)   SpO2 98%   BMI 29.94 kg/m  GEN: NAD EYE: Sclerae anicteric ENT: MMM  CV: Non-tachycardic Pulm: CTA b/l GI: Soft, NT/ND NEURO:  Alert & Oriented x 3   Doristine Locks, DO  Gastroenterology   06/14/2022 9:18 AM

## 2022-06-15 ENCOUNTER — Telehealth: Payer: Self-pay

## 2022-06-15 NOTE — Telephone Encounter (Signed)
No answer, left message to call if having any issues or concerns, B.Kyliah Deanda RN 

## 2022-07-01 ENCOUNTER — Telehealth: Payer: BC Managed Care – PPO | Admitting: Physician Assistant

## 2022-07-01 DIAGNOSIS — J069 Acute upper respiratory infection, unspecified: Secondary | ICD-10-CM

## 2022-07-01 MED ORDER — FLUTICASONE PROPIONATE 50 MCG/ACT NA SUSP: 2.0000 | Freq: Every day | NASAL | 0 refills | Status: DC

## 2022-07-01 MED ORDER — BENZONATATE 100 MG PO CAPS: 100.0000 mg | ORAL_CAPSULE | Freq: Three times a day (TID) | ORAL | 0 refills | Status: DC | PRN

## 2022-07-01 NOTE — Progress Notes (Signed)
I have spent 5 minutes in review of e-visit questionnaire, review and updating patient chart, medical decision making and response to patient.   Sharnese Heath Cody Willma Obando, PA-C    

## 2022-07-01 NOTE — Progress Notes (Signed)

## 2022-07-19 ENCOUNTER — Ambulatory Visit (INDEPENDENT_AMBULATORY_CARE_PROVIDER_SITE_OTHER): Payer: BC Managed Care – PPO | Admitting: Family Medicine

## 2022-07-19 ENCOUNTER — Encounter: Payer: Self-pay | Admitting: Family Medicine

## 2022-07-19 VITALS — BP 116/76 | HR 78 | Temp 96.6°F | Ht 63.0 in | Wt 167.8 lb

## 2022-07-19 DIAGNOSIS — H6690 Otitis media, unspecified, unspecified ear: Secondary | ICD-10-CM

## 2022-07-19 MED ORDER — CEPHALEXIN 500 MG PO CAPS: 500.0000 mg | ORAL_CAPSULE | Freq: Two times a day (BID) | ORAL | 0 refills | Status: AC

## 2022-07-19 NOTE — Progress Notes (Signed)
Assessment/Plan:   Problem List Items Addressed This Visit   None Visit Diagnoses     Acute otitis media, unspecified otitis media type    -  Primary   Relevant Medications   cephALEXin (KEFLEX) 500 MG capsule      Brief Summary: The patient presents with right-sided otalgia and clogging sensation, worsening over the past 4 days, with associated nocturnal discomfort. Examination findings suggest the presence of fluid behind the tympanic membrane without overt signs of infection or wax impaction.  Differential diagnosis: - Eustachian Tube Dysfunction - Acute Otitis Media - Serous Otitis Media  Plan: The patient should be started on a course of Cephalexin as a safe alternative to penicillin, given her specific allergy. Continue the use of over-the-counter treatments for symptomatic relief and advise on the use of pain relief such as Ibuprofen or Tylenol as needed. Follow-up is necessary if symptoms persist or worsen.  There are no discontinued medications.    Subjective:  HPI: Encounter date: 07/19/2022  Theresa Gonzalez is a 57 y.o. 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   She  has a past medical history of Angio-edema, Asthma, Pneumonia, PONV (postoperative nausea and vomiting), Pre-diabetes, Recurrent upper respiratory infection (URI),  Restless legs, and Urticaria..   She presents with chief complaint of Acute Visit (C/o right ear being clogged x 2 weeks/Thinks it may be water form the beach ) .  Problem 1: The patient reports a clogged sensation in the right ear, described as comparable to a drumming sound, especially notable with chewing. This has been occurring for approximately 2 weeks, with a worsening of symptoms in the last 4 days. There is a positional component to the sensation, with some relief noted upon leaning over but promptly returning upon resuming an upright posture. Last night, the patient experienced a dull pain sufficient to disturb sleep, though no continual ringing noise or hearing loss was reported. The patient also mentions persistent postnasal drip and a runny nose, which are ongoing issues for her. She normally uses Flonase for these symptoms but has not applied this treatment in relation to the ear problem.  Past Surgical History:  Procedure Laterality Date   ANKLE SURGERY     HERNIA REPAIR Bilateral    knee replacement Left 06/2019   LAPAROSCOPIC LYSIS OF ADHESIONS     TUBAL LIGATION      Outpatient Medications Prior to Visit  Medication Sig Dispense Refill   benzonatate (TESSALON) 100 MG capsule Take 1 capsule (100 mg total) by mouth 3 (three) times daily as needed for cough. 30 capsule 0   estradiol (CLIMARA - DOSED IN MG/24 HR) 0.05 mg/24hr patch Place 0.5 mg onto the skin once a week.     fenofibrate (TRICOR) 145 MG tablet Take 1 tablet (145 mg total) by mouth daily. 90 tablet 3   fluticasone (FLONASE) 50 MCG/ACT nasal spray Place 2 sprays into both nostrils daily. 16 g 0   MAGNESIUM GLUCONATE PO Take 120 mg by  mouth daily.     omeprazole (PRILOSEC) 20 MG capsule Take 20 mg by mouth daily.     ondansetron (ZOFRAN) 4 MG tablet Take 1 tablet (4 mg total) by mouth every 8 (eight) hours as needed for nausea or vomiting. 20 tablet 0   pramipexole (MIRAPEX) 1 MG tablet Take 1 tablet (1 mg total) by  mouth at bedtime. 90 tablet 3   Prasterone, DHEA, (DHEA PO) Take 5 mg by mouth daily.     Pregnenolone Micronized (PREGNENOLONE PO) Take 75 mg by mouth daily.     Probiotic Product (PROBIOTIC DAILY) CAPS Take 1 capsule by mouth daily.     progesterone (PROMETRIUM) 100 MG capsule Take 300 mg by mouth at bedtime.     NP THYROID 15 MG tablet Take 15 mg by mouth every morning. (Patient not taking: Reported on 07/19/2022)     ondansetron (ZOFRAN-ODT) 4 MG disintegrating tablet Take 1 tablet (4 mg total) by mouth every 8 (eight) hours as needed for nausea or vomiting. (Patient not taking: Reported on 07/19/2022) 12 tablet 0   No facility-administered medications prior to visit.    Family History  Problem Relation Age of Onset   Hypertension Mother    Hyperlipidemia Mother    Retinitis pigmentosa Mother    Heart disease Father    Diabetes Father    Prostate cancer Father    Cancer - Prostate Father    Diabetes Brother    Heart attack Maternal Grandmother    Stroke Maternal Grandfather    Heart attack Maternal Grandfather    Heart disease Paternal Grandmother    Epilepsy Paternal Grandmother    Diabetes Paternal Grandfather    Retinitis pigmentosa Maternal Aunt    Allergic rhinitis Neg Hx    Asthma Neg Hx    Eczema Neg Hx    Urticaria Neg Hx    Colon cancer Neg Hx    Rectal cancer Neg Hx    Stomach cancer Neg Hx    Pancreatic cancer Neg Hx    Liver cancer Neg Hx     Social History   Socioeconomic History   Marital status: Married    Spouse name: Not on file   Number of children: 2   Years of education: Not on file   Highest education level: Not on file  Occupational History   Occupation: Ship broker  Tobacco Use   Smoking status: Never   Smokeless tobacco: Never  Vaping Use   Vaping Use: Never used  Substance and Sexual Activity   Alcohol use: Yes    Comment: socially   Drug use: Never   Sexual activity: Yes  Other Topics Concern   Not on file  Social History Narrative    Not on file   Social Determinants of Health   Financial Resource Strain: Not on file  Food Insecurity: Not on file  Transportation Needs: Not on file  Physical Activity: Not on file  Stress: Not on file  Social Connections: Not on file  Intimate Partner Violence: Not on file  Objective:  Physical Exam: BP 116/76 (BP Location: Left Arm, Patient Position: Sitting, Cuff Size: Small)   Pulse 78   Temp (!) 96.6 F (35.9 C) (Temporal)   Ht 5\' 3"  (1.6 m)   Wt 167 lb 12.8 oz (76.1 kg)   SpO2 97%   BMI 29.72 kg/m    General: No acute distress. Awake and conversant.  Eyes: Normal conjunctiva, anicteric. Round symmetric pupils.  ENT: Hearing grossly intact. No nasal discharge. Erythema and fluid noted behind the right tympanic membrane. Left ear and nasal passages within normal limits. Neck: Neck is supple. No masses or thyromegaly.  Respiratory: Respirations are non-labored. No auditory wheezing.  Skin: Warm. No rashes or ulcers.  Psych: Alert and oriented. Cooperative, Appropriate mood and affect, Normal judgment.  CV: No cyanosis or JVD MSK: Normal ambulation. No clubbing  Neuro: Sensation and CN II-XII grossly normal.        , MD, MS

## 2022-08-03 DIAGNOSIS — E538 Deficiency of other specified B group vitamins: Secondary | ICD-10-CM | POA: Diagnosis not present

## 2022-08-03 DIAGNOSIS — E8881 Metabolic syndrome: Secondary | ICD-10-CM | POA: Diagnosis not present

## 2022-08-03 DIAGNOSIS — G4700 Insomnia, unspecified: Secondary | ICD-10-CM | POA: Diagnosis not present

## 2022-08-03 DIAGNOSIS — Z1329 Encounter for screening for other suspected endocrine disorder: Secondary | ICD-10-CM | POA: Diagnosis not present

## 2022-08-03 DIAGNOSIS — E559 Vitamin D deficiency, unspecified: Secondary | ICD-10-CM | POA: Diagnosis not present

## 2022-08-03 DIAGNOSIS — R109 Unspecified abdominal pain: Secondary | ICD-10-CM | POA: Diagnosis not present

## 2022-08-03 DIAGNOSIS — N951 Menopausal and female climacteric states: Secondary | ICD-10-CM | POA: Diagnosis not present

## 2022-08-03 DIAGNOSIS — G479 Sleep disorder, unspecified: Secondary | ICD-10-CM | POA: Diagnosis not present

## 2022-08-12 DIAGNOSIS — H0101B Ulcerative blepharitis left eye, upper and lower eyelids: Secondary | ICD-10-CM | POA: Diagnosis not present

## 2022-08-12 DIAGNOSIS — Z83518 Family history of other specified eye disorder: Secondary | ICD-10-CM | POA: Diagnosis not present

## 2022-08-12 DIAGNOSIS — H5213 Myopia, bilateral: Secondary | ICD-10-CM | POA: Diagnosis not present

## 2022-08-12 DIAGNOSIS — D3132 Benign neoplasm of left choroid: Secondary | ICD-10-CM | POA: Diagnosis not present

## 2022-08-12 DIAGNOSIS — H52223 Regular astigmatism, bilateral: Secondary | ICD-10-CM | POA: Diagnosis not present

## 2022-08-12 DIAGNOSIS — H0101A Ulcerative blepharitis right eye, upper and lower eyelids: Secondary | ICD-10-CM | POA: Diagnosis not present

## 2022-08-12 DIAGNOSIS — H524 Presbyopia: Secondary | ICD-10-CM | POA: Diagnosis not present

## 2022-08-18 DIAGNOSIS — G4700 Insomnia, unspecified: Secondary | ICD-10-CM | POA: Diagnosis not present

## 2022-08-18 DIAGNOSIS — E538 Deficiency of other specified B group vitamins: Secondary | ICD-10-CM | POA: Diagnosis not present

## 2022-08-18 DIAGNOSIS — N951 Menopausal and female climacteric states: Secondary | ICD-10-CM | POA: Diagnosis not present

## 2022-08-18 DIAGNOSIS — E559 Vitamin D deficiency, unspecified: Secondary | ICD-10-CM | POA: Diagnosis not present

## 2022-09-06 ENCOUNTER — Ambulatory Visit: Payer: BC Managed Care – PPO | Admitting: Family Medicine

## 2022-09-06 VITALS — BP 124/84 | HR 66 | Temp 97.1°F | Ht 63.0 in | Wt 164.6 lb

## 2022-09-06 DIAGNOSIS — N3281 Overactive bladder: Secondary | ICD-10-CM | POA: Insufficient documentation

## 2022-09-06 HISTORY — DX: Overactive bladder: N32.81

## 2022-09-06 LAB — POCT URINALYSIS DIPSTICK
Bilirubin, UA: NEGATIVE
Blood, UA: NEGATIVE
Glucose, UA: NEGATIVE
Ketones, UA: NEGATIVE
Leukocytes, UA: NEGATIVE
Nitrite, UA: NEGATIVE
Protein, UA: NEGATIVE
Spec Grav, UA: 1.01 (ref 1.010–1.025)
Urobilinogen, UA: 0.2 E.U./dL
pH, UA: 7 (ref 5.0–8.0)

## 2022-09-06 MED ORDER — OXYBUTYNIN CHLORIDE ER 10 MG PO TB24: 10.0000 mg | ORAL_TABLET | Freq: Every day | ORAL | 11 refills | Status: DC

## 2022-09-06 NOTE — Assessment & Plan Note (Signed)
The urine is normal. Based on the history, I believe Theresa Gonzalez has an overactive bladder with urinary frequency and bladder spasms. I will give her a trial of oxybutynin and see if this improves her symptoms. I will have her back in 1 month to reassess.

## 2022-09-06 NOTE — Progress Notes (Signed)
[facility] [address] [place] [address] Dept: [phone] Dept Fax: [phone]  Office Visit  Subjective:    Patient ID: Theresa Gonzalez, female    DOB: 22-Sep-1965, 57 y.o..   MRN: [id number]  Chief Complaint  Patient presents with   Dysuria    C/o having pain with urination, pressure in low abdomen, Rt side flank pain and itching x  2 weeks.     History of Present Illness:  Patient is in today for with a recent history of dysuria and frequency. She has had some mild perineal itching, but does not have a discharge. she has also felt occasional crampy pain in her lower abdomen. She is not running fever. Theresa Gonzalez is currently managed on an estradiol patch and pregnenolone for vasomotor symptoms of menopause.  She does admit that she is getting up multiple times a night to urinate.  Past Medical History: Patient Active Problem List   Diagnosis Date Noted   Overactive bladder 09/06/2022   Peroneal tendinitis of left lower leg    Unilateral primary osteoarthritis, right knee 01/19/2022   Pain in left ankle and joints of left foot 01/19/2022   Acute medial meniscus tear of right knee 12/15/2021   Menopausal state 11/27/2021   Cystic disease of liver 07/08/2021   Mesenteric cyst 07/08/2021   History of kidney stones 07/02/2021   Arthritis of ankle or foot, degenerative, right 03/16/2021   Prediabetes 02/27/2021   Chronic rhinitis 02/26/2021   Gastroesophageal reflux disease 02/26/2021   DUB (dysfunctional uterine bleeding) 11/28/2020   Facet arthropathy, lumbar 11/28/2020   Mild intermittent asthma, uncomplicated 0000000   RLS (restless legs syndrome) 11/28/2020   SK (seborrheic keratosis) 11/28/2020   Vitamin D insufficiency 11/28/2020   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, 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   Mixed hyperlipidemia 02/04/2011   Past Surgical History:  Procedure Laterality Date    ANKLE SURGERY     HERNIA REPAIR Bilateral    knee replacement Left 06/2019   LAPAROSCOPIC LYSIS OF ADHESIONS     TUBAL LIGATION     Family History  Problem Relation Age of Onset   Hypertension Mother    Hyperlipidemia Mother    Retinitis pigmentosa Mother    Heart disease Father    Diabetes Father    Prostate cancer Father    Cancer - Prostate Father    Diabetes Brother    Heart attack Maternal Grandmother    Stroke Maternal Grandfather    Heart attack Maternal Grandfather    Heart disease Paternal Grandmother    Epilepsy Paternal Grandmother    Diabetes Paternal Grandfather    Retinitis pigmentosa Maternal Aunt    Allergic rhinitis Neg Hx    Asthma Neg Hx    Eczema Neg Hx    Urticaria Neg Hx    Colon cancer Neg Hx    Rectal cancer Neg Hx    Stomach cancer Neg Hx    Pancreatic cancer Neg Hx    Liver cancer Neg Hx    Outpatient Medications Prior to Visit  Medication Sig Dispense Refill   DOTTI 0.075 MG/24HR Place 1 patch onto the skin 2 (two) times a week.     fenofibrate (TRICOR) 145 MG tablet Take 1 tablet (145 mg total) by mouth daily. 90 tablet 3   MAGNESIUM GLUCONATE PO Take 120 mg by mouth daily.     omeprazole (PRILOSEC) 20 MG capsule Take 20 mg by mouth daily.  ondansetron (ZOFRAN) 4 MG tablet Take 1 tablet (4 mg total) by mouth every 8 (eight) hours as needed for nausea or vomiting. 20 tablet 0   pramipexole (MIRAPEX) 1 MG tablet Take 1 tablet (1 mg total) by mouth at bedtime. 90 tablet 3   Prasterone, DHEA, (DHEA PO) Take 5 mg by mouth daily.     Pregnenolone Micronized (PREGNENOLONE PO) Take 75 mg by mouth daily.     Probiotic Product (PROBIOTIC DAILY) CAPS Take 1 capsule by mouth daily.     progesterone (PROMETRIUM) 100 MG capsule Take 300 mg by mouth at bedtime.     estradiol (CLIMARA - DOSED IN MG/24 HR) 0.05 mg/24hr patch Place 0.5 mg onto the skin once a week.     benzonatate (TESSALON) 100 MG capsule Take 1 capsule (100 mg total) by mouth 3 (three)  times daily as needed for cough. 30 capsule 0   fluticasone (FLONASE) 50 MCG/ACT nasal spray Place 2 sprays into both nostrils daily. 16 g 0   NP THYROID 15 MG tablet Take 15 mg by mouth every morning. (Patient not taking: Reported on 07/19/2022)     ondansetron (ZOFRAN-ODT) 4 MG disintegrating tablet Take 1 tablet (4 mg total) by mouth every 8 (eight) hours as needed for nausea or vomiting. (Patient not taking: Reported on 07/19/2022) 12 tablet 0   No facility-administered medications prior to visit.   Allergies  Allergen Reactions   Penicillins Shortness Of Breath, Swelling and Rash   Potassium Citrate Itching   Ropinirole Shortness Of Breath   Cetirizine Hcl Other (See Comments)    Blood Pressure dropped   Nabumetone Rash   Nsaids Rash    Provider: Mallory Shirk CFM - Allergy Description: NSAIDs CFM - Allergy Annotation: rash   Sodium Hyaluronate Hives     Objective:   Today's Vitals   09/06/22 1544  BP: 124/84  Pulse: 66  Temp: (!) 97.1 F (36.2 C)  TempSrc: Temporal  SpO2: 97%  Weight: 164 lb 9.6 oz (74.7 kg)  Height: '5\' 3"'$  (1.6 m)   Body mass index is 29.16 kg/m.   General: Well developed, well nourished. No acute distress. Psych: Alert and oriented. Normal mood and affect.  There are no preventive care reminders to display for this patient.  Lab Results Component Ref Range & Units 15:49 (09/06/22)  Color, UA  yellow  Clarity, UA  clear  Glucose, UA Negative Negative  Bilirubin, UA  neg  Ketones, UA  neg  Spec Grav, UA 1.010 - 1.025 1.010  Blood, UA  neg  pH, UA 5.0 - 8.0 7.0  Protein, UA Negative Negative  Urobilinogen, UA 0.2 or 1.0 E.U./dL 0.2  Nitrite, UA  neg  Leukocytes, UA Negative Negative   Assessment & Plan:   Problem List Items Addressed This Visit       Genitourinary   Overactive bladder - Primary    The urine is normal. Based on the history, I believe Theresa Gonzalez has an overactive bladder with urinary frequency and bladder spasms. I will  give her a trial of oxybutynin and see if this improves her symptoms. I will have her back in 1 month to reassess.      Relevant Medications   oxybutynin (DITROPAN XL) 10 MG 24 hr tablet   Other Relevant Orders   POCT Urinalysis Dipstick (Completed)    No follow-ups on file.   Haydee Salter, MD

## 2022-09-07 DIAGNOSIS — D485 Neoplasm of uncertain behavior of skin: Secondary | ICD-10-CM | POA: Diagnosis not present

## 2022-09-07 DIAGNOSIS — L821 Other seborrheic keratosis: Secondary | ICD-10-CM | POA: Diagnosis not present

## 2022-10-07 ENCOUNTER — Ambulatory Visit: Payer: BC Managed Care – PPO | Admitting: Family Medicine

## 2022-11-03 ENCOUNTER — Other Ambulatory Visit: Payer: Self-pay | Admitting: Family Medicine

## 2022-11-03 ENCOUNTER — Ambulatory Visit
Admission: RE | Admit: 2022-11-03 | Discharge: 2022-11-03 | Disposition: A | Discharge status: Home / Self Care | Payer: BC Managed Care – PPO | Source: Ambulatory Visit | Attending: Family Medicine | Admitting: Family Medicine

## 2022-11-03 DIAGNOSIS — Z1231 Encounter for screening mammogram for malignant neoplasm of breast: Secondary | ICD-10-CM

## 2022-11-14 ENCOUNTER — Other Ambulatory Visit: Payer: Self-pay | Admitting: Family Medicine

## 2022-11-14 DIAGNOSIS — G2581 Restless legs syndrome: Secondary | ICD-10-CM

## 2022-12-14 DIAGNOSIS — N951 Menopausal and female climacteric states: Secondary | ICD-10-CM | POA: Diagnosis not present

## 2022-12-14 DIAGNOSIS — E559 Vitamin D deficiency, unspecified: Secondary | ICD-10-CM | POA: Diagnosis not present

## 2022-12-14 DIAGNOSIS — R7301 Impaired fasting glucose: Secondary | ICD-10-CM | POA: Diagnosis not present

## 2022-12-14 DIAGNOSIS — E538 Deficiency of other specified B group vitamins: Secondary | ICD-10-CM | POA: Diagnosis not present

## 2022-12-14 DIAGNOSIS — G4700 Insomnia, unspecified: Secondary | ICD-10-CM | POA: Diagnosis not present

## 2022-12-14 DIAGNOSIS — R109 Unspecified abdominal pain: Secondary | ICD-10-CM | POA: Diagnosis not present

## 2022-12-15 ENCOUNTER — Ambulatory Visit: Payer: BC Managed Care – PPO | Admitting: Family Medicine

## 2022-12-15 VITALS — BP 124/80 | HR 70 | Temp 98.0°F | Ht 63.0 in | Wt 157.8 lb

## 2022-12-15 DIAGNOSIS — R7303 Prediabetes: Secondary | ICD-10-CM | POA: Diagnosis not present

## 2022-12-15 DIAGNOSIS — E782 Mixed hyperlipidemia: Secondary | ICD-10-CM

## 2022-12-15 DIAGNOSIS — E559 Vitamin D deficiency, unspecified: Secondary | ICD-10-CM | POA: Diagnosis not present

## 2022-12-15 DIAGNOSIS — N3281 Overactive bladder: Secondary | ICD-10-CM

## 2022-12-15 LAB — LIPID PANEL
Cholesterol: 197 mg/dL (ref 0–200)
HDL: 57.5 mg/dL (ref >39.00)
LDL Cholesterol: 114 mg/dL — ABNORMAL HIGH (ref 0–99)
NonHDL: 139.69
Total CHOL/HDL Ratio: 3
Triglycerides: 129 mg/dL (ref 0.0–149.0)
VLDL: 25.8 mg/dL (ref 0.0–40.0)

## 2022-12-15 LAB — HEMOGLOBIN A1C: Hgb A1c MFr Bld: 5.6 % (ref 4.6–6.5)

## 2022-12-15 LAB — VITAMIN D 25 HYDROXY (VIT D DEFICIENCY, FRACTURES): VITD: 66.69 ng/mL (ref 30.00–100.00)

## 2022-12-15 LAB — GLUCOSE, RANDOM: Glucose, Bld: 96 mg/dL (ref 70–99)

## 2022-12-15 NOTE — Progress Notes (Signed)
[facility] [address] [place] [address] Dept: [phone] Dept Fax: [phone]  Chronic Care Office Visit  Subjective:    Patient ID: Alicemae Roed, female    DOB: June 27, 1966, 57 y.o..   MRN: [id number]  Chief Complaint  Patient presents with   Medical Management of Chronic Issues    1 month f/u.  Fasting today.  Stopped Oxybutynin due to side effects.    History of Present Illness:  Patient is in today for reassessment of chronic medical issues.  At her last visit a month ago, Ms. Bartoletti had complained of dysuria and frequency with nocturia. I had prescribed a trial of oxybutynin. Ms. Bonvillain notes that the medicine seemed to worsen morning time nausea that she has more chronically. She stopped the medicine. She feels her symptoms have improved. She wonders if these weren't stress related,a s she was finishing up her bachelor's degree at the time.  Ms. Wallen has a history of dyslipidemia, mainly with elevated triglycerides and is managed on fenofibrate 145 mg daily. She also has a history of prediabetes.   Past Medical History: Patient Active Problem List   Diagnosis Date Noted   Overactive bladder 09/06/2022   Peroneal tendinitis of left lower leg    Unilateral primary osteoarthritis, right knee 01/19/2022   Pain in left ankle and joints of left foot 01/19/2022   Acute medial meniscus tear of right knee 12/15/2021   Menopausal state 11/27/2021   Cystic disease of liver 07/08/2021   Mesenteric cyst 07/08/2021   History of kidney stones 07/02/2021   Arthritis of ankle or foot, degenerative, right 03/16/2021   Prediabetes 02/27/2021   Chronic rhinitis 02/26/2021   Gastroesophageal reflux disease 02/26/2021   DUB (dysfunctional uterine bleeding) 11/28/2020   Facet arthropathy, lumbar 11/28/2020   Mild intermittent asthma, uncomplicated 11/28/2020   RLS (restless legs syndrome) 11/28/2020   SK (seborrheic  keratosis) 11/28/2020   Vitamin D insufficiency 11/28/2020   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, 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   Mixed hyperlipidemia 02/04/2011   Past Surgical History:  Procedure Laterality Date   ANKLE SURGERY     BREAST EXCISIONAL BIOPSY Right    HERNIA REPAIR Bilateral    knee replacement Left 06/2019   LAPAROSCOPIC LYSIS OF ADHESIONS     TUBAL LIGATION     Family History  Problem Relation Age of Onset   Hypertension Mother    Hyperlipidemia Mother    Retinitis pigmentosa Mother    Heart disease Father    Diabetes Father    Prostate cancer Father    Cancer - Prostate Father    Diabetes Brother    Heart attack Maternal Grandmother    Stroke Maternal Grandfather    Heart attack Maternal Grandfather    Heart disease Paternal Grandmother    Epilepsy Paternal Grandmother    Diabetes Paternal Grandfather    Retinitis pigmentosa Maternal Aunt    Allergic rhinitis Neg Hx    Asthma Neg Hx    Eczema Neg Hx    Urticaria Neg Hx    Colon cancer Neg Hx    Rectal cancer Neg Hx    Stomach cancer Neg Hx    Pancreatic cancer Neg Hx    Liver cancer Neg Hx    Outpatient Medications Prior to Visit  Medication Sig Dispense Refill   DOTTI 0.075 MG/24HR Place 1 patch onto the skin 2 (two) times a week.     fenofibrate (TRICOR) 145  MG tablet Take 1 tablet (145 mg total) by mouth daily. 90 tablet 3   MAGNESIUM GLUCONATE PO Take 120 mg by mouth daily.     omeprazole (PRILOSEC) 20 MG capsule Take 20 mg by mouth daily.     ondansetron (ZOFRAN) 4 MG tablet Take 1 tablet (4 mg total) by mouth every 8 (eight) hours as needed for nausea or vomiting. 20 tablet 0   pramipexole (MIRAPEX) 1 MG tablet TAKE 1 TABLET BY MOUTH AT BEDTIME. 90 tablet 3   Prasterone, DHEA, (DHEA PO) Take 5 mg by mouth daily.     Pregnenolone Micronized (PREGNENOLONE PO) Take 75 mg by mouth daily.     Probiotic Product (PROBIOTIC DAILY) CAPS Take 1 capsule by mouth daily.      progesterone (PROMETRIUM) 100 MG capsule Take 300 mg by mouth at bedtime.     oxybutynin (DITROPAN XL) 10 MG 24 hr tablet Take 1 tablet (10 mg total) by mouth at bedtime. 30 tablet 11   No facility-administered medications prior to visit.   Allergies  Allergen Reactions   Penicillins Shortness Of Breath, Swelling and Rash   Potassium Citrate Itching   Ropinirole Shortness Of Breath   Cetirizine Hcl Other (See Comments)    Blood Pressure dropped   Nabumetone Rash   Nsaids Rash    Provider: Valere Dross CFM - Allergy Description: NSAIDs CFM - Allergy Annotation: rash   Sodium Hyaluronate Hives   Oxybutynin Nausea Only   Objective:   Today's Vitals   12/15/22 0807  BP: 124/80  Pulse: 70  Temp: 98 F (36.7 C)  TempSrc: Temporal  SpO2: 98%  Weight: 157 lb 12.8 oz (71.6 kg)  Height: 5\' 3"  (1.6 m)   Body mass index is 27.95 kg/m.   General: Well developed, well nourished. No acute distress. Psych: Alert and oriented. Normal mood and affect.  There are no preventive care reminders to display for this patient.   Assessment & Plan:   Problem List Items Addressed This Visit       Genitourinary   Overactive bladder - Primary    Symptoms are improved now and may have been stress related.        Other   Mixed hyperlipidemia    We will reassess lipids today. Plan to continue fenofibrate 145 mg daily.      Relevant Orders   Lipid panel   Vitamin D insufficiency    I will reassess her Vitamin D levels, as it has been some time since this was checked.      Relevant Orders   VITAMIN D 25 Hydroxy (Vit-D Deficiency, Fractures)   Prediabetes    We will conduct annual screening for progression to diabetes.      Relevant Orders   Glucose, random   Hemoglobin A1c    Return in about 6 months (around 06/16/2023) for Reassessment.   Loyola Mast, MD

## 2022-12-15 NOTE — Assessment & Plan Note (Signed)
I will reassess her Vitamin D levels, as it has been some time since this was checked.

## 2022-12-15 NOTE — Assessment & Plan Note (Signed)
Symptoms are improved now and may have been stress related.

## 2022-12-15 NOTE — Assessment & Plan Note (Signed)
We will reassess lipids today. Plan to continue fenofibrate 145 mg daily.

## 2022-12-15 NOTE — Assessment & Plan Note (Signed)
We will conduct annual screening for progression to diabetes.

## 2022-12-28 DIAGNOSIS — E559 Vitamin D deficiency, unspecified: Secondary | ICD-10-CM | POA: Diagnosis not present

## 2022-12-28 DIAGNOSIS — E538 Deficiency of other specified B group vitamins: Secondary | ICD-10-CM | POA: Diagnosis not present

## 2022-12-28 DIAGNOSIS — G4700 Insomnia, unspecified: Secondary | ICD-10-CM | POA: Diagnosis not present

## 2022-12-28 DIAGNOSIS — N951 Menopausal and female climacteric states: Secondary | ICD-10-CM | POA: Diagnosis not present

## 2023-02-22 ENCOUNTER — Encounter: Payer: Self-pay | Admitting: Family Medicine

## 2023-02-22 ENCOUNTER — Ambulatory Visit: Payer: BC Managed Care – PPO | Admitting: Family Medicine

## 2023-02-22 VITALS — HR 80 | Temp 98.1°F | Ht 63.0 in | Wt 158.4 lb

## 2023-02-22 DIAGNOSIS — R5383 Other fatigue: Secondary | ICD-10-CM | POA: Diagnosis not present

## 2023-02-22 DIAGNOSIS — Z789 Other specified health status: Secondary | ICD-10-CM | POA: Diagnosis not present

## 2023-02-22 DIAGNOSIS — R079 Chest pain, unspecified: Secondary | ICD-10-CM | POA: Diagnosis not present

## 2023-02-22 NOTE — Progress Notes (Signed)
[facility] [address] [place] [address] Dept: [phone] Dept Fax: [phone]  Office Visit  Subjective:    Patient ID: Theresa Gonzalez, female    DOB: 05-16-1966, 57 y.o..   MRN: [id number]  Chief Complaint  Patient presents with   Fatigue    While on trip didn't feel well, yesterday took Asprin due to elevated heart rate and today having tingling in LT arm.    History of Present Illness:  Patient is in today complaining of fatigue and lack of energy. She returned 9 days ago from a trip to [place]. While there, she was visiting rural communities in the [place] of the country. She did eat local foods and drink local water. She notes that after several days, she was feeling worn down. She was given oral rehydration solution. At one point, a medical member of her team did feel she was looking gray. Upon return home, she is some better, but her symptoms have not resolved. She has not run fever. She did take Malarone for malaria prophylaxis. She did note a few mosquito bites while she was in [place]. She also took some albendazole as recommended by a physician on their trip related to eating some fo the local food. she is no longer on these meds.  In addition to feeling run down, she has had a couple of episodes of palpitations and a tingling in her left chest. The worst was about 2 days ago.  Past Medical History: Patient Active Problem List   Diagnosis Date Noted   Overactive bladder 09/06/2022   Peroneal tendinitis of left lower leg    Unilateral primary osteoarthritis, right knee 01/19/2022   Pain in left ankle and joints of left foot 01/19/2022   Acute medial meniscus tear of right knee 12/15/2021   Menopausal state 11/27/2021   Cystic disease of liver 07/08/2021   Mesenteric cyst 07/08/2021   History of kidney stones 07/02/2021   Arthritis of ankle or foot, degenerative, right 03/16/2021   Prediabetes  02/27/2021   Chronic rhinitis 02/26/2021   Gastroesophageal reflux disease 02/26/2021   DUB (dysfunctional uterine bleeding) 11/28/2020   Facet arthropathy, lumbar 11/28/2020   Mild intermittent asthma, uncomplicated 11/28/2020   RLS (restless legs syndrome) 11/28/2020   SK (seborrheic keratosis) 11/28/2020   Vitamin D insufficiency 11/28/2020   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, 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   Mixed hyperlipidemia 02/04/2011   Past Surgical History:  Procedure Laterality Date   ANKLE SURGERY     BREAST EXCISIONAL BIOPSY Right    HERNIA REPAIR Bilateral    knee replacement Left 06/2019   LAPAROSCOPIC LYSIS OF ADHESIONS     TUBAL LIGATION     Family History  Problem Relation Age of Onset   Hypertension Mother    Hyperlipidemia Mother    Retinitis pigmentosa Mother    Heart disease Father    Diabetes Father    Prostate cancer Father    Cancer - Prostate Father    Diabetes Brother    Heart attack Maternal Grandmother    Stroke Maternal Grandfather    Heart attack Maternal Grandfather    Heart disease Paternal Grandmother    Epilepsy Paternal Grandmother    Diabetes Paternal Grandfather    Retinitis pigmentosa Maternal Aunt    Allergic rhinitis Neg Hx    Asthma Neg Hx    Eczema Neg Hx    Urticaria Neg Hx    Colon cancer Neg  Hx    Rectal cancer Neg Hx    Stomach cancer Neg Hx    Pancreatic cancer Neg Hx    Liver cancer Neg Hx    Outpatient Medications Prior to Visit  Medication Sig Dispense Refill   estradiol (VIVELLE-DOT) 0.1 MG/24HR patch Place 1 patch onto the skin 2 (two) times a week.     fenofibrate (TRICOR) 145 MG tablet Take 1 tablet (145 mg total) by mouth daily. 90 tablet 3   MAGNESIUM GLUCONATE PO Take 120 mg by mouth daily.     omeprazole (PRILOSEC) 20 MG capsule Take 20 mg by mouth daily.     ondansetron (ZOFRAN) 4 MG tablet Take 1 tablet (4 mg total) by mouth every 8 (eight) hours as needed for nausea or vomiting. 20  tablet 0   pramipexole (MIRAPEX) 1 MG tablet TAKE 1 TABLET BY MOUTH AT BEDTIME. 90 tablet 3   Prasterone, DHEA, (DHEA PO) Take 5 mg by mouth daily.     Pregnenolone Micronized (PREGNENOLONE PO) Take 75 mg by mouth daily.     Probiotic Product (PROBIOTIC DAILY) CAPS Take 1 capsule by mouth daily.     progesterone (PROMETRIUM) 100 MG capsule Take 300 mg by mouth at bedtime.     DOTTI 0.075 MG/24HR Place 1 patch onto the skin 2 (two) times a week.     No facility-administered medications prior to visit.   Allergies  Allergen Reactions   Penicillins Shortness Of Breath, Swelling and Rash   Potassium Citrate Itching   Ropinirole Shortness Of Breath   Cetirizine Hcl Other (See Comments)    Blood Pressure dropped   Nabumetone Rash   Nsaids Rash    Provider: Valere Dross CFM - Allergy Description: NSAIDs CFM - Allergy Annotation: rash   Sodium Hyaluronate Hives   Oxybutynin Nausea Only     Objective:   Today's Vitals   02/22/23 1505  Pulse: 80  Temp: 98.1 F (36.7 C)  TempSrc: Temporal  SpO2: 99%  Weight: 158 lb 6.4 oz (71.8 kg)  Height: 5\' 3"  (1.6 m)   Body mass index is 28.06 kg/m.   General: Well developed, well nourished. No acute distress. CV: RRR without murmurs or rubs. Pulses 2+ bilaterally. Psych: Alert and oriented. Normal mood and affect.  There are no preventive care reminders to display for this patient.  EKG: Normal sinus rhythm (rate= 73) with a low-voltage pattern.    Assessment & Plan:  1. Other fatigue 2. Travel within last 14 days The current symptoms may have been due to prolonged travel and dehydration. However, I will check some labs to screen for signs of complications of tropical disease. If these are normal. I recommend she work on hydraitona nd rest. If symptoms persists past another 7 days, I would recommend referral to [facility].  - Comprehensive metabolic panel - CBC with Differential/Platelet  2. Chest pain, unspecified type Suspect this is  related to her dehydration symptoms. No sign of cardiac issues.  - EKG 12-Lead    Return in about 1 week (around 03/01/2023), or if symptoms worsen or fail to improve.   Loyola Mast, MD

## 2023-02-23 ENCOUNTER — Telehealth: Payer: Self-pay | Admitting: Family Medicine

## 2023-02-23 LAB — COMPREHENSIVE METABOLIC PANEL
ALT: 15 U/L (ref 0–35)
AST: 16 U/L (ref 0–37)
Albumin: 4.4 g/dL (ref 3.5–5.2)
Alkaline Phosphatase: 92 U/L (ref 39–117)
BUN: 19 mg/dL (ref 6–23)
CO2: 26 meq/L (ref 19–32)
Calcium: 9.3 mg/dL (ref 8.4–10.5)
Chloride: 108 mEq/L (ref 96–112)
Creatinine, Ser: 1.15 mg/dL (ref 0.40–1.20)
GFR: 52.49 mL/min — ABNORMAL LOW (ref >60.00)
Glucose, Bld: 104 mg/dL — ABNORMAL HIGH (ref 70–99)
Potassium: 4.4 meq/L (ref 3.5–5.1)
Sodium: 141 meq/L (ref 135–145)
Total Bilirubin: 0.3 mg/dL (ref 0.2–1.2)
Total Protein: 6.4 g/dL (ref 6.0–8.3)

## 2023-02-23 LAB — CBC WITH DIFFERENTIAL/PLATELET
Basophils Absolute: 0.1 10*3/uL (ref 0.0–0.1)
Basophils Relative: 1 % (ref 0.0–3.0)
Eosinophils Absolute: 0.2 10*3/uL (ref 0.0–0.7)
Eosinophils Relative: 2.9 % (ref 0.0–5.0)
HCT: 40.2 % (ref 36.0–46.0)
Hemoglobin: 13.2 g/dL (ref 12.0–15.0)
Lymphocytes Relative: 29.7 % (ref 12.0–46.0)
Lymphs Abs: 1.9 10*3/uL (ref 0.7–4.0)
MCHC: 32.9 g/dL (ref 30.0–36.0)
MCV: 86.9 fl (ref 78.0–100.0)
Monocytes Absolute: 0.6 10*3/uL (ref 0.1–1.0)
Monocytes Relative: 9 % (ref 3.0–12.0)
Neutro Abs: 3.7 10*3/uL (ref 1.4–7.7)
Neutrophils Relative %: 57.4 % (ref 43.0–77.0)
Platelets: 243 10*3/uL (ref 150.0–400.0)
RBC: 4.62 Mil/uL (ref 3.87–5.11)
RDW: 13.6 % (ref 11.5–15.5)
WBC: 6.5 10*3/uL (ref 4.0–10.5)

## 2023-02-23 NOTE — Telephone Encounter (Signed)
Pt has a question about her labs. She would like to discuss that her FTEGFR is so low. She is concerned.  [phone]

## 2023-02-25 ENCOUNTER — Encounter: Payer: Self-pay | Admitting: Family Medicine

## 2023-03-23 ENCOUNTER — Encounter: Payer: Self-pay | Admitting: Family Medicine

## 2023-03-23 ENCOUNTER — Ambulatory Visit: Payer: BC Managed Care – PPO | Admitting: Family Medicine

## 2023-03-23 VITALS — BP 128/80 | HR 58 | Temp 97.5°F | Ht 63.0 in | Wt 160.0 lb

## 2023-03-23 DIAGNOSIS — R6889 Other general symptoms and signs: Secondary | ICD-10-CM | POA: Diagnosis not present

## 2023-03-23 DIAGNOSIS — R5383 Other fatigue: Secondary | ICD-10-CM | POA: Diagnosis not present

## 2023-03-23 DIAGNOSIS — N1831 Chronic kidney disease, stage 3a: Secondary | ICD-10-CM

## 2023-03-23 DIAGNOSIS — R079 Chest pain, unspecified: Secondary | ICD-10-CM

## 2023-03-23 HISTORY — DX: Chronic kidney disease, stage 3a: N18.31

## 2023-03-23 NOTE — Progress Notes (Signed)
[facility] [address] [place] [address] Dept: [phone] Dept Fax: [phone]  Office Visit  Subjective:    Patient ID: Theresa Gonzalez, female    DOB: 08-11-1965, 57 y.o..   MRN: [id number]  Chief Complaint  Patient presents with   Follow-up    F/u lab results    History of Present Illness:  Patient is in today for reassessment of her fatigue issues. I saw Ms. Seivert on 8/20. She returned 9 days prior from a trip to [place]. While there, she was visiting rural communities in the [place] of the country. She did eat local foods and drink local water. She noted that after several days, she was feeling worn down. She was given oral rehydration solution. At one point, a medical member of her team did feel she was looking gray. Upon return home, she is some better, but her symptoms have not resolved. She has not run fever. She did take Malarone for malaria prophylaxis. She noted a few mosquito bites while she was in [place]. She also took some albendazole as recommended by a physician on their trip related to eating some of the local food. She is no longer on these meds.  In addition to feeling run down, she has had a couple of episodes of palpitations and a tingling in her left chest. She continues to note issues at this point with exercise intolerance and some dyspnea. She does not feel she has fully recovered from the events in [place].  Past Medical History: Patient Active Problem List   Diagnosis Date Noted   Stage 3a chronic kidney disease (CKD) (HCC) 03/23/2023   Overactive bladder 09/06/2022   Peroneal tendinitis of left lower leg    Unilateral primary osteoarthritis, right knee 01/19/2022   Pain in left ankle and joints of left foot 01/19/2022   Acute medial meniscus tear of right knee 12/15/2021   Menopausal state 11/27/2021   Cystic disease of liver 07/08/2021   Mesenteric cyst 07/08/2021   History of kidney  stones 07/02/2021   Arthritis of ankle or foot, degenerative, right 03/16/2021   Prediabetes 02/27/2021   Chronic rhinitis 02/26/2021   Gastroesophageal reflux disease 02/26/2021   DUB (dysfunctional uterine bleeding) 11/28/2020   Facet arthropathy, lumbar 11/28/2020   Mild intermittent asthma, uncomplicated 11/28/2020   RLS (restless legs syndrome) 11/28/2020   SK (seborrheic keratosis) 11/28/2020   Vitamin D insufficiency 11/28/2020   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, 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   Mixed hyperlipidemia 02/04/2011   Past Surgical History:  Procedure Laterality Date   ANKLE SURGERY     BREAST EXCISIONAL BIOPSY Right    HERNIA REPAIR Bilateral    knee replacement Left 06/2019   LAPAROSCOPIC LYSIS OF ADHESIONS     TUBAL LIGATION     Family History  Problem Relation Age of Onset   Hypertension Mother    Hyperlipidemia Mother    Retinitis pigmentosa Mother    Heart disease Father    Diabetes Father    Prostate cancer Father    Cancer - Prostate Father    Diabetes Brother    Heart attack Maternal Grandmother    Stroke Maternal Grandfather    Heart attack Maternal Grandfather    Heart disease Paternal Grandmother    Epilepsy Paternal Grandmother    Diabetes Paternal Grandfather    Retinitis pigmentosa Maternal Aunt    Allergic rhinitis Neg Hx    Asthma Neg Hx  Eczema Neg Hx    Urticaria Neg Hx    Colon cancer Neg Hx    Rectal cancer Neg Hx    Stomach cancer Neg Hx    Pancreatic cancer Neg Hx    Liver cancer Neg Hx    Outpatient Medications Prior to Visit  Medication Sig Dispense Refill   estradiol (VIVELLE-DOT) 0.1 MG/24HR patch Place 1 patch onto the skin 2 (two) times a week.     fenofibrate (TRICOR) 145 MG tablet Take 1 tablet (145 mg total) by mouth daily. 90 tablet 3   MAGNESIUM GLUCONATE PO Take 120 mg by mouth daily.     omeprazole (PRILOSEC) 20 MG capsule Take 20 mg by mouth daily.     ondansetron (ZOFRAN) 4 MG tablet  Take 1 tablet (4 mg total) by mouth every 8 (eight) hours as needed for nausea or vomiting. 20 tablet 0   pramipexole (MIRAPEX) 1 MG tablet TAKE 1 TABLET BY MOUTH AT BEDTIME. 90 tablet 3   Prasterone, DHEA, (DHEA PO) Take 5 mg by mouth daily.     Pregnenolone Micronized (PREGNENOLONE PO) Take 75 mg by mouth daily.     Probiotic Product (PROBIOTIC DAILY) CAPS Take 1 capsule by mouth daily.     progesterone (PROMETRIUM) 100 MG capsule Take 300 mg by mouth at bedtime.     No facility-administered medications prior to visit.   Allergies  Allergen Reactions   Penicillins Shortness Of Breath, Swelling and Rash   Potassium Citrate Itching   Ropinirole Shortness Of Breath   Cetirizine Hcl Other (See Comments)    Blood Pressure dropped   Nabumetone Rash   Nsaids Rash    Provider: Valere Dross CFM - Allergy Description: NSAIDs CFM - Allergy Annotation: rash   Sodium Hyaluronate (Non-Avian) Hives   Oxybutynin Nausea Only     Objective:   Today's Vitals   03/23/23 0949  BP: 128/80  Pulse: (!) 58  Temp: (!) 97.5 F (36.4 C)  TempSrc: Temporal  SpO2: 99%  Weight: 160 lb (72.6 kg)  Height: 5\' 3"  (1.6 m)   Body mass index is 28.34 kg/m.   General: Well developed, well nourished. No acute distress. Psych: Alert and oriented. Normal mood and affect.  Health Maintenance Due  Topic Date Due   Cervical Cancer Screening (HPV/Pap Cotest)  Never done   Lab Results    Latest Ref Rng & Units 02/22/2023    3:56 PM 06/06/2022    7:45 PM 03/12/2022    6:10 AM  CBC  WBC 4.0 - 10.5 K/uL 6.5  6.2  5.1   Hemoglobin 12.0 - 15.0 g/dL 21.3  08.6  57.8   Hematocrit 36.0 - 46.0 % 40.2  38.1  41.6   Platelets 150.0 - 400.0 K/uL 243.0  285  256        Latest Ref Rng & Units 02/22/2023    3:56 PM 12/15/2022    8:45 AM 06/06/2022    7:45 PM  CMP  Glucose 70 - 99 mg/dL 469  96  92   BUN 6 - 23 mg/dL 19   17   Creatinine 6.29 - 1.20 mg/dL 5.28   4.13   Sodium 244 - 145 mEq/L 141   139   Potassium  3.5 - 5.1 mEq/L 4.4   3.7   Chloride 96 - 112 mEq/L 108   109   CO2 19 - 32 mEq/L 26   22   Calcium 8.4 - 10.5 mg/dL 9.3   9.3  Total Protein 6.0 - 8.3 g/dL 6.4   6.7   Total Bilirubin 0.2 - 1.2 mg/dL 0.3   0.6   Alkaline Phos 39 - 117 U/L 92   95   AST 0 - 37 U/L 16   24   ALT 0 - 35 U/L 15   21       Assessment & Plan:  1. Stage 3a chronic kidney disease (CKD) (HCC) Reviewed prior eGFR results. She has demonstrated a steady decline. We discussed implications of moderate CKD. Recommended avoidance of NSAIDs, controllign blood pressure well,and keeping hydrated. I will plan annual monitoring.  2. Exercise intolerance 3. Other fatigue 4. Chest pain, unspecified type Etiologies are unclear. I would recommend a cardiac evaluation to assess for any underlying cause for these symptoms.  - Ambulatory referral to Cardiology    Return in about 6 months (around 09/20/2023) for Reassessment.   Loyola Mast, MD

## 2023-03-23 NOTE — Assessment & Plan Note (Signed)
Reviewed prior eGFR results. She has demonstrated a steady decline. We discussed implications of moderate CKD. Recommended avoidance of NSAIDs, controllign blood pressure well,and keeping hydrated. I will plan annual monitoring.

## 2023-03-28 DIAGNOSIS — G4700 Insomnia, unspecified: Secondary | ICD-10-CM | POA: Diagnosis not present

## 2023-03-28 DIAGNOSIS — R7301 Impaired fasting glucose: Secondary | ICD-10-CM | POA: Diagnosis not present

## 2023-03-28 DIAGNOSIS — E612 Magnesium deficiency: Secondary | ICD-10-CM | POA: Diagnosis not present

## 2023-03-28 DIAGNOSIS — N951 Menopausal and female climacteric states: Secondary | ICD-10-CM | POA: Diagnosis not present

## 2023-04-01 ENCOUNTER — Ambulatory Visit: Payer: Self-pay

## 2023-04-01 ENCOUNTER — Ambulatory Visit
Admission: EM | Admit: 2023-04-01 | Discharge: 2023-04-01 | Disposition: A | Discharge status: Home / Self Care | Payer: BC Managed Care – PPO | Attending: Physician Assistant | Admitting: Physician Assistant

## 2023-04-01 ENCOUNTER — Emergency Department (HOSPITAL_COMMUNITY): Payer: BC Managed Care – PPO

## 2023-04-01 ENCOUNTER — Emergency Department (HOSPITAL_COMMUNITY)
Admission: EM | Admit: 2023-04-01 | Discharge: 2023-04-01 | Disposition: A | Discharge status: Home / Self Care | Payer: BC Managed Care – PPO | Attending: Emergency Medicine | Admitting: Emergency Medicine

## 2023-04-01 ENCOUNTER — Other Ambulatory Visit: Payer: Self-pay

## 2023-04-01 DIAGNOSIS — R519 Headache, unspecified: Secondary | ICD-10-CM | POA: Diagnosis not present

## 2023-04-01 DIAGNOSIS — R0602 Shortness of breath: Secondary | ICD-10-CM

## 2023-04-01 DIAGNOSIS — R0600 Dyspnea, unspecified: Secondary | ICD-10-CM | POA: Diagnosis not present

## 2023-04-01 DIAGNOSIS — R42 Dizziness and giddiness: Secondary | ICD-10-CM | POA: Diagnosis not present

## 2023-04-01 DIAGNOSIS — Z1152 Encounter for screening for COVID-19: Secondary | ICD-10-CM | POA: Diagnosis not present

## 2023-04-01 LAB — CBC WITH DIFFERENTIAL/PLATELET
Abs Immature Granulocytes: 0.04 10*3/uL (ref 0.00–0.07)
Basophils Absolute: 0 10*3/uL (ref 0.0–0.1)
Basophils Relative: 1 %
Eosinophils Absolute: 0.1 10*3/uL (ref 0.0–0.5)
Eosinophils Relative: 2 %
HCT: 41.6 % (ref 36.0–46.0)
Hemoglobin: 13.8 g/dL (ref 12.0–15.0)
Immature Granulocytes: 1 %
Lymphocytes Relative: 27 %
Lymphs Abs: 1.6 10*3/uL (ref 0.7–4.0)
MCH: 28.3 pg (ref 26.0–34.0)
MCHC: 33.2 g/dL (ref 30.0–36.0)
MCV: 85.2 fL (ref 80.0–100.0)
Monocytes Absolute: 0.4 10*3/uL (ref 0.1–1.0)
Monocytes Relative: 8 %
Neutro Abs: 3.7 10*3/uL (ref 1.7–7.7)
Neutrophils Relative %: 61 %
Platelets: 234 10*3/uL (ref 150–400)
RBC: 4.88 MIL/uL (ref 3.87–5.11)
RDW: 13.4 % (ref 11.5–15.5)
WBC: 5.8 10*3/uL (ref 4.0–10.5)
nRBC: 0 % (ref 0.0–0.2)

## 2023-04-01 LAB — BASIC METABOLIC PANEL
Anion gap: 15 (ref 5–15)
BUN: 11 mg/dL (ref 6–20)
CO2: 20 mmol/L — ABNORMAL LOW (ref 22–32)
Calcium: 9.2 mg/dL (ref 8.9–10.3)
Chloride: 107 mmol/L (ref 98–111)
Creatinine, Ser: 0.78 mg/dL (ref 0.44–1.00)
GFR, Estimated: >60 mL/min (ref >60)
Glucose, Bld: 93 mg/dL (ref 70–99)
Potassium: 3.8 mmol/L (ref 3.5–5.1)
Sodium: 142 mmol/L (ref 135–145)

## 2023-04-01 LAB — D-DIMER, QUANTITATIVE: D-Dimer, Quant: <0.27 ug{FEU}/mL (ref 0.00–0.50)

## 2023-04-01 LAB — RESP PANEL BY RT-PCR (RSV, FLU A&B, COVID)  RVPGX2
Influenza A by PCR: NEGATIVE
Influenza B by PCR: NEGATIVE
Resp Syncytial Virus by PCR: NEGATIVE
SARS Coronavirus 2 by RT PCR: NEGATIVE

## 2023-04-01 LAB — TROPONIN I (HIGH SENSITIVITY)
Troponin I (High Sensitivity): 3 ng/L (ref <18)
Troponin I (High Sensitivity): 3 ng/L (ref <18)

## 2023-04-01 LAB — BRAIN NATRIURETIC PEPTIDE: B Natriuretic Peptide: 11.7 pg/mL (ref 0.0–100.0)

## 2023-04-01 NOTE — ED Triage Notes (Signed)
Pt reports she felt lightheaded this morning and her blood pressure was high 140/90 and shortness of breath.

## 2023-04-01 NOTE — Discharge Instructions (Addendum)
Go directly to the emergency department

## 2023-04-01 NOTE — ED Notes (Signed)
Report received from Margo Aye RN. Assumed care of pt at this time.

## 2023-04-01 NOTE — ED Provider Notes (Signed)
[facility]    CSN: [id number] Arrival date & time: 04/01/23  1249      History   Chief Complaint Chief Complaint  Patient presents with   Hypertension   Shortness of Breath         HPI Theresa Gonzalez is a 57 y.o. female.    Hypertension Associated symptoms include shortness of breath.  Shortness of Breath  Not feeling for approximately 1 hour, states with taking an online class when she began to feel lightheaded, this was associated with some blurred vision.  She checked her blood pressure and found it to be elevated, she normally runs with a systolic of approximately 115.  States she had a similar episode beginning of August while in [place] and elevated blood pressure  systolic 150s, associated with other symptoms such as fatigue.  She admits feeling fatigued now, with a vague sensation of shortness of breath denies specific chest pain however has noted a "pinching sensation" to her minimally over the last 1 month.  She had nausea this morning.  Denies current nausea, diaphoresis .Marland Kitchen  Had headache for 3 days ending yesterday no current headache.  Denies paresthesias, abdominal pain, vomiting, diarrhea Admits recent travel went to [place] beginning of August.  Admits estrogen use. Past Medical History:  Diagnosis Date   Angio-edema    Asthma    Pneumonia    PONV (postoperative nausea and vomiting)    Pre-diabetes    Recurrent upper respiratory infection (URI)    Restless legs    Urticaria     Patient Active Problem List   Diagnosis Date Noted   Stage 3a chronic kidney disease (CKD) (HCC) 03/23/2023   Overactive bladder 09/06/2022   Peroneal tendinitis of left lower leg    Unilateral primary osteoarthritis, right knee 01/19/2022   Pain in left ankle and joints of left foot 01/19/2022   Acute medial meniscus tear of right knee 12/15/2021   Menopausal state 11/27/2021   Cystic disease of liver 07/08/2021   Mesenteric cyst 07/08/2021   History of kidney stones  07/02/2021   Arthritis of ankle or foot, degenerative, right 03/16/2021   Prediabetes 02/27/2021   Chronic rhinitis 02/26/2021   Gastroesophageal reflux disease 02/26/2021   DUB (dysfunctional uterine bleeding) 11/28/2020   Facet arthropathy, lumbar 11/28/2020   Mild intermittent asthma, uncomplicated 11/28/2020   RLS (restless legs syndrome) 11/28/2020   SK (seborrheic keratosis) 11/28/2020   Vitamin D insufficiency 11/28/2020   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      Home Medications    Prior to Admission medications   Medication Sig Start Date End Date Taking? Authorizing Provider  estradiol (VIVELLE-DOT) 0.1 MG/24HR patch Place 1 patch onto the skin 2 (two) times a week. 12/28/22   [provider]  fenofibrate (TRICOR) 145 MG tablet Take 1 tablet (145 mg total) by mouth daily. 11/27/21   Loyola Mast, MD  MAGNESIUM GLUCONATE PO Take 120 mg by mouth daily.    [provider]  omeprazole (PRILOSEC) 20 MG capsule Take 20 mg by mouth daily.  [provider]  ondansetron (ZOFRAN) 4 MG tablet Take 1 tablet (4 mg total) by mouth every 8 (eight) hours as needed for nausea or vomiting. 05/17/21   [name], PA-C  pramipexole (MIRAPEX) 1 MG tablet TAKE 1 TABLET BY MOUTH AT BEDTIME. 11/14/22   Loyola Mast, MD  Prasterone, DHEA, (DHEA PO) Take 5 mg by mouth daily.    [provider]  Pregnenolone Micronized (PREGNENOLONE PO) Take 75 mg by mouth daily.    [provider]  Probiotic Product (PROBIOTIC DAILY) CAPS Take 1 capsule by mouth daily.    [provider]   progesterone (PROMETRIUM) 100 MG capsule Take 300 mg by mouth at bedtime. 11/25/21   [provider]    Family History Family History  Problem Relation Age of Onset   Hypertension Mother    Hyperlipidemia Mother    Retinitis pigmentosa Mother    Heart disease Father    Diabetes Father    Prostate cancer Father    Cancer - Prostate Father    Diabetes Brother    Heart attack Maternal Grandmother    Stroke Maternal Grandfather    Heart attack Maternal Grandfather    Heart disease Paternal Grandmother    Epilepsy Paternal Grandmother    Diabetes Paternal Grandfather    Retinitis pigmentosa Maternal Aunt    Allergic rhinitis Neg Hx    Asthma Neg Hx    Eczema Neg Hx    Urticaria Neg Hx    Colon cancer Neg Hx    Rectal cancer Neg Hx    Stomach cancer Neg Hx    Pancreatic cancer Neg Hx    Liver cancer Neg Hx     Social History Social History   Tobacco Use   Smoking status: Never   Smokeless tobacco: Never  Vaping Use   Vaping status: Never Used  Substance Use Topics   Alcohol use: Yes    Comment: socially   Drug use: Never     Allergies   Penicillins, Potassium citrate, Ropinirole, Cetirizine hcl, Nabumetone, Nsaids, Sodium hyaluronate (non-avian), and Oxybutynin   Review of Systems Review of Systems  Constitutional:  Negative for appetite change and chills.  Respiratory:  Positive for shortness of breath.      Physical Exam Triage Vital Signs ED Triage Vitals  Encounter Vitals Group     BP 04/01/23 1300 (!) 145/93     Systolic BP Percentile --      Diastolic BP Percentile --      Pulse Rate 04/01/23 1300 68     Resp 04/01/23 1300 18     Temp 04/01/23 1300 99 F (37.2 C)     Temp Source 04/01/23 1257 Oral     SpO2 04/01/23 1300 98 %     Weight --      Height --      Head Circumference --      Peak Flow --      Pain Score 04/01/23 1301 0     Pain Loc --      Pain Education --      Exclude from Growth Chart --    No data  found.  Updated Vital Signs BP (!) 145/93 (BP Location: Left Arm)   Pulse 68   Temp 99 F (37.2 C) (Oral)   Resp 18   LMP 09/15/2020   SpO2 98%   Visual Acuity Right Eye Distance:   Left Eye Distance:   Bilateral Distance:    Right  Eye Near:   Left Eye Near:    Bilateral Near:     Physical Exam Vitals and nursing note reviewed.  Constitutional:      Appearance: She is well-developed.  HENT:     Head: Normocephalic and atraumatic.     Mouth/Throat:     Mouth: Mucous membranes are moist.  Cardiovascular:     Rate and Rhythm: Normal rate and regular rhythm.  Pulmonary:     Effort: Pulmonary effort is normal.     Breath sounds: Normal breath sounds. No wheezing or rhonchi.  Musculoskeletal:        General: Normal range of motion.     Cervical back: Neck supple.  Skin:    General: Skin is warm.  Neurological:     General: No focal deficit present.     Mental Status: She is alert and oriented to person, place, and time.     Cranial Nerves: No cranial nerve deficit.     Motor: No weakness.  Psychiatric:        Mood and Affect: Mood normal.      UC Treatments / Results  Labs (all labs ordered are listed, but only abnormal results are displayed) Labs Reviewed - No data to display  EKG ED ECG REPORT   Date: 04/01/2023  EKG Time: 1:29 PM  Rate: 63  Rhythm: normal sinus rhythm,  normal EKG, normal sinus rhythm, unchanged from previous tracings  Axis: left  Intervals:none  ST&T Change: none  Narrative Interpretation: low voltage QRS             Radiology No results found.  Procedures Procedures (including critical care time)  Medications Ordered in UC Medications - No data to display  Initial Impression / Assessment and Plan / UC Course  I have reviewed the triage vital signs and the nursing notes.  Pertinent labs & imaging results that were available during my care of the patient were reviewed by me and considered in my medical decision making  (see chart for details).     57 year old female with long-term severe lightheadedness associated with a prescription, associated shortness of breath.  Appropriate use risk factors including recent travel, estrogen use.  Admits smoking as a teenager. Discussed with patient need for ED evaluation worrisome symptoms of lightheadedness, shortness of breath, elevated blood pressure.  Differential diagnosis includes coronary artery disease, PE.  EKG with no acute findings.  Vital signs are stable, mildly elevated BP. patient will drive self to local ED Final Clinical Impressions(s) / UC Diagnoses   Final diagnoses:  Shortness of breath     Discharge Instructions      Go directly to the emergency department     ED Prescriptions   None    PDMP not reviewed this encounter.   Meliton Rattan, [place] 04/01/23 1330

## 2023-04-01 NOTE — ED Triage Notes (Signed)
Pt came from school after vision got blurry and when she checked her BP it was 143/95. No hx of HTN. Traveled to [place] August 7-18, was on malaria meds before traveling and was having similar episodes. Has had episodes of headache, blurred vision, HTN and dizziness since she started those meds. Aox4.

## 2023-04-01 NOTE — Discharge Instructions (Signed)
The test today in the ED did not give [place] the answer as to the source of your symptoms but overall very reassuring.  Your blood tests were normal.  X-rays and scans did not show any acute abnormalities.  Follow-up with a cardiologist and primary doctor as planned for further evaluation.  Return as needed for worsening symptoms

## 2023-04-01 NOTE — ED Provider Triage Note (Signed)
Emergency Medicine Provider Triage Evaluation Note  Theresa Gonzalez , a 57 y.o. female  was evaluated in triage.  Pt complains of lightheadedness, shortness of breath worse with exertion, and intermittent chest pain has been ongoing since she got back from [place].  Patient also takes estrogen supplementation.  She denies fever, chills, cough, congestion.  Review of Systems  Positive:  Negative: See above   Physical Exam  BP (!) 141/88 (BP Location: Right Arm)   Pulse 66   Temp 97.8 F (36.6 C) (Oral)   Resp 20   Ht 5\' 3"  (1.6 m)   Wt 70.3 kg   LMP 09/15/2020   SpO2 98%   BMI 27.46 kg/m  Gen:   Awake, no distress   Resp:  Normal effort  MSK:   Moves extremities without difficulty  Other:  No calf tenderness  Medical Decision Making  Medically screening exam initiated at 2:57 PM.  Appropriate orders placed.  Theresa Gonzalez was informed that the remainder of the evaluation will be completed by another provider, this initial triage assessment does not replace that evaluation, and the importance of remaining in the ED until their evaluation is complete.     Honor Loh Ferney, [place] 04/01/23 [phone]

## 2023-04-01 NOTE — ED Provider Notes (Signed)
[place] EMERGENCY DEPARTMENT AT [facility] Provider Note   CSN: [id number] Arrival date & time: 04/01/23  1356     History  Chief Complaint  Patient presents with   Dizziness   Shortness of Breath   Blurred Vision    Theresa Gonzalez is a 57 y.o. female.   Dizziness Associated symptoms: shortness of breath   Shortness of Breath    Patient has a history of urticaria or angioedema restless leg syndrome pneumonia.  She presents ED for recurrent episodes ongoing since she traveled to [place] last month.  Patient was started on malaria medications while she was there.  Patient states while traveling she was having trouble with intermittent headaches.  She is also having hypertension and having episodes of feeling lightheaded and shortness of breath.  She has been feeling very fatigued especially with any exertion.  Patient denies any chest pain.  She has not had any new leg swelling.  She is having intermittent headaches and she does not usually have headaches.  She is not currently having a headache now.  No known fevers or chills.  Patient states she saw her doctor on August 20.She had basic laboratory tests.  Patient was given a referral to cardiology by her doctor.  She has not seen them yet.   PT had another episode today while at work so she came to the ED. Home Medications Prior to Admission medications   Medication Sig Start Date End Date Taking? Authorizing Provider  estradiol (VIVELLE-DOT) 0.1 MG/24HR patch Place 1 patch onto the skin 2 (two) times a week. 12/28/22   [provider]  fenofibrate (TRICOR) 145 MG tablet Take 1 tablet (145 mg total) by mouth daily. 11/27/21   Loyola Mast, MD  MAGNESIUM GLUCONATE PO Take 120 mg by mouth daily.    [provider]  omeprazole (PRILOSEC) 20 MG capsule Take 20 mg by mouth daily.    [provider]  ondansetron (ZOFRAN) 4 MG tablet Take 1 tablet (4 mg total) by mouth every 8 (eight) hours as  needed for nausea or vomiting. 05/17/21   [name], PA-C  pramipexole (MIRAPEX) 1 MG tablet TAKE 1 TABLET BY MOUTH AT BEDTIME. 11/14/22   Loyola Mast, MD  Prasterone, DHEA, (DHEA PO) Take 5 mg by mouth daily.    [provider]  Pregnenolone Micronized (PREGNENOLONE PO) Take 75 mg by mouth daily.    [provider]  Probiotic Product (PROBIOTIC DAILY) CAPS Take 1 capsule by mouth daily.    [provider]  progesterone (PROMETRIUM) 100 MG capsule Take 300 mg by mouth at bedtime. 11/25/21   [provider]      Allergies    Penicillins, Potassium citrate, Ropinirole, Cetirizine hcl, Nabumetone, Nsaids, Sodium hyaluronate (non-avian), and Oxybutynin    Review of Systems   Review of Systems  Respiratory:  Positive for shortness of breath.   Neurological:  Positive for dizziness.    Physical Exam Updated Vital Signs BP 126/71   Pulse 65   Temp 98.5 F (36.9 C) (Oral)   Resp 17   Ht 1.6 m (5\' 3" )   Wt 70.3 kg   LMP 09/15/2020   SpO2 99%   BMI 27.46 kg/m  Physical Exam Vitals and nursing note reviewed.  Constitutional:      Appearance: She is well-developed. She is not ill-appearing or diaphoretic.  HENT:     Head: Normocephalic and atraumatic.     Right Ear: External ear  normal.     Left Ear: External ear normal.  Eyes:     General: No scleral icterus.       Right eye: No discharge.        Left eye: No discharge.     Conjunctiva/sclera: Conjunctivae normal.  Neck:     Trachea: No tracheal deviation.  Cardiovascular:     Rate and Rhythm: Normal rate and regular rhythm.  Pulmonary:     Effort: Pulmonary effort is normal. No respiratory distress.     Breath sounds: Normal breath sounds. No stridor. No wheezing or rales.  Abdominal:     General: Bowel sounds are normal. There is no distension.     Palpations: Abdomen is soft.     Tenderness: There is no abdominal tenderness. There is no guarding or rebound.   Musculoskeletal:        General: No tenderness or deformity.     Cervical back: Neck supple.     Right lower leg: No edema.     Left lower leg: No edema.  Skin:    General: Skin is warm and dry.     Findings: No rash.  Neurological:     General: No focal deficit present.     Mental Status: She is alert.     Cranial Nerves: No cranial nerve deficit, dysarthria or facial asymmetry.     Sensory: No sensory deficit.     Motor: No abnormal muscle tone or seizure activity.     Coordination: Coordination normal.  Psychiatric:        Mood and Affect: Mood normal.     ED Results / Procedures / Treatments   Labs (all labs ordered are listed, but only abnormal results are displayed) Labs Reviewed  BASIC METABOLIC PANEL - Abnormal; Notable for the following components:      Result Value   CO2 20 (*)    All other components within normal limits  RESP PANEL BY RT-PCR (RSV, FLU A&B, COVID)  RVPGX2  CBC WITH DIFFERENTIAL/PLATELET  D-DIMER, QUANTITATIVE  BRAIN NATRIURETIC PEPTIDE  TROPONIN I (HIGH SENSITIVITY)  TROPONIN I (HIGH SENSITIVITY)    EKG EKG Interpretation Date/Time:  Friday April 01 2023 14:29:09 EDT Ventricular Rate:  69 PR Interval:  152 QRS Duration:  95 QT Interval:  411 QTC Calculation: 441 R Axis:   1  Text Interpretation: Sinus rhythm Low voltage, precordial leads Confirmed by Linwood Dibbles [phone]) on 04/01/2023 3:17:15 PM  Radiology DG Chest 2 View  Result Date: 04/01/2023 CLINICAL DATA:  Shortness of breath. EXAM: CHEST - 2 VIEW COMPARISON:  05/17/2022 FINDINGS: The lungs are clear without focal pneumonia, edema, pneumothorax or pleural effusion. The cardiopericardial silhouette is within normal limits for size. No acute bony abnormality. Telemetry leads overlie the chest. IMPRESSION: No active cardiopulmonary disease. Electronically Signed   By: Kennith Center M.D.   On: 04/01/2023 18:27   CT Head Wo Contrast  Result Date: 04/01/2023 CLINICAL DATA:   Headache, new onset (Age >= 51y) EXAM: CT HEAD WITHOUT CONTRAST TECHNIQUE: Contiguous axial images were obtained from the base of the skull through the vertex without intravenous contrast. RADIATION DOSE REDUCTION: This exam was performed according to the departmental dose-optimization program which includes automated exposure control, adjustment of the mA and/or kV according to patient size and/or use of iterative reconstruction technique. COMPARISON:  None Available. FINDINGS: Brain: No evidence of acute infarction, hemorrhage, hydrocephalus, extra-axial collection or mass lesion/mass effect. Vascular: No hyperdense vessel identified. Skull: No acute fracture. Sinuses/Orbits:  Clear sinuses.  No acute orbital findings. Other: No mastoid effusions. IMPRESSION: No evidence of acute intracranial abnormality. Electronically Signed   By: Feliberto Harts M.D.   On: 04/01/2023 16:58    Procedures Procedures    Medications Ordered in ED Medications - No data to display  ED Course/ Medical Decision Making/ A&P Clinical Course as of 04/01/23 1936  Fri Apr 01, 2023  1650 CBC metabolic panel COVID-negative.  D-dimer negative.  Troponin normal [JK]  1711 Head CT without acute abnormality [JK]  1924 X-ray without acute finding [JK]    Clinical Course User Index [JK] Linwood Dibbles, MD                                 Medical Decision Making Problems Addressed: Dizziness: acute illness or injury Dyspnea, unspecified type: acute illness or injury that poses a threat to life or bodily functions  Amount and/or Complexity of Data Reviewed Labs: ordered. Decision-making details documented in ED Course. Radiology: ordered and independent interpretation performed.   Patient presented to the ED for evaluation lightheadedness shortness of breath intermittent chest discomfort headaches.  Symptoms ongoing since a trip to [place].  Patient was on antimalarial medications at that time.  Other people with her on the  trip noted that she had poor skin color.  Patient has not had any fevers.  No prior history of heart or lung disease.  Patient had another episode today.  She did see her doctor and was waiting for cardiac evaluation but was instructed come to ED considering her recurrent or worsening symptoms.  Patient's ED workup is reassuring.  She has normal vital signs.  No oxygen requirement.  No tachycardia or hypotension.  Her lab test did not show anemia or metabolic abnormalities.  Serial troponins are normal.  Her BMP is normal.  COVID flu RSV negative.  D-dimer is negative.  No findings to suggest PE or acute cardiac syndrome.  Head CT was ordered considering her plaints of dizziness increasing headaches.  No signs of any acute abnormality.  No focal neurodeficits noted on exam.  Chest x-ray does not show pneumonia.  Etiology of symptoms unclear but the workup is reassuring.  Patient does have scheduled outpatient cardiology follow-up.  Evaluation and diagnostic testing in the emergency department does not suggest an emergent condition requiring admission or immediate intervention beyond what has been performed at this time.  The patient is safe for discharge and has been instructed to return immediately for worsening symptoms, change in symptoms or any other concerns.        Final Clinical Impression(s) / ED Diagnoses Final diagnoses:  Dizziness  Dyspnea, unspecified type    Rx / DC Orders ED Discharge Orders     None         Linwood Dibbles, MD 04/01/23 [phone]

## 2023-04-04 NOTE — Progress Notes (Unsigned)
Cardiology Office Note:    Date:  04/04/2023   ID:  Theresa Gonzalez, DOB Nov 27, 1965, MRN [id number]  PCP:  Loyola Mast, MD  Cardiologist:  Norman Herrlich, MD   Referring MD: Loyola Mast, MD  ASSESSMENT:    No diagnosis found. PLAN:    In order of problems listed above:  ***  Next appointment   Medication Adjustments/Labs and Tests Ordered: Current medicines are reviewed at length with the patient today.  Concerns regarding medicines are outlined above.  No orders of the defined types were placed in this encounter.  No orders of the defined types were placed in this encounter.    No chief complaint on file. ***  History of Present Illness:    Theresa Gonzalez is a 57 y.o. female with a history of stage IIIa CKD fatigue chest pain and palpitation who is being seen today at the request of Loyola Mast, MD.  She was seen [facility] ED 04/01/2019 for for dizziness and shortness of breath.  Evaluation included a D-dimer that was low high-sensitivity troponin that was low BNP level that was very low 11.7 and the exclude heart failure category chest x-ray was normal and EKG showed sinus rhythm low voltage otherwise normal. Past Medical History:  Diagnosis Date   Angio-edema    Asthma    Pneumonia    PONV (postoperative nausea and vomiting)    Pre-diabetes    Recurrent upper respiratory infection (URI)    Restless legs    Urticaria     Past Surgical History:  Procedure Laterality Date   ANKLE SURGERY     BREAST EXCISIONAL BIOPSY Right    HERNIA REPAIR Bilateral    knee replacement Left 06/2019   LAPAROSCOPIC LYSIS OF ADHESIONS     TUBAL LIGATION      Current Medications: No outpatient medications have been marked as taking for the 04/05/23 encounter (Appointment) with Baldo Daub, MD.     Allergies:   Penicillins, Potassium citrate, Ropinirole, Cetirizine hcl, Nabumetone, Nsaids, Sodium hyaluronate (non-avian), and Oxybutynin   Social History    Socioeconomic History   Marital status: Married    Spouse name: Not on file   Number of children: 2   Years of education: Not on file   Highest education level: Bachelor's degree (e.g., BA, AB, BS)  Occupational History   Occupation: Student  Tobacco Use   Smoking status: Never   Smokeless tobacco: Never  Vaping Use   Vaping status: Never Used  Substance and Sexual Activity   Alcohol use: Yes    Comment: socially   Drug use: Never   Sexual activity: Yes  Other Topics Concern   Not on file  Social History Narrative   Not on file   Social Determinants of Health   Financial Resource Strain: Low Risk  (12/14/2022)   Overall Financial Resource Strain (CARDIA)    Difficulty of Paying Living Expenses: Not hard at all  Food Insecurity: No Food Insecurity (12/14/2022)   Hunger Vital Sign    Worried About Running Out of Food in the Last Year: Never true    Ran Out of Food in the Last Year: Never true  Transportation Needs: No Transportation Needs (12/14/2022)   PRAPARE - Administrator, Civil Service (Medical): No    Lack of Transportation (Non-Medical): No  Physical Activity: Sufficiently Active (12/14/2022)   Exercise Vital Sign    Days of Exercise per Week: 5 days    Minutes  of Exercise per Session: 30 min  Stress: No Stress Concern Present (12/14/2022)   [employer] of Occupational Health - Occupational Stress Questionnaire    Feeling of Stress : Only a little  Social Connections: Socially Integrated (12/14/2022)   Social Connection and Isolation Panel [NHANES]    Frequency of Communication with Friends and Family: More than three times a week    Frequency of Social Gatherings with Friends and Family: Once a week    Attends Religious Services: More than 4 times per year    Active Member of [employer] or Organizations: Yes    Attends Engineer, structural: More than 4 times per year    Marital Status: Married     Family History: The patient's ***family  history includes Cancer - Prostate in her father; Diabetes in her brother, father, and paternal grandfather; Epilepsy in her paternal grandmother; Heart attack in her maternal grandfather and maternal grandmother; Heart disease in her father and paternal grandmother; Hyperlipidemia in her mother; Hypertension in her mother; Prostate cancer in her father; Retinitis pigmentosa in her maternal aunt and mother; Stroke in her maternal grandfather. There is no history of Allergic rhinitis, Asthma, Eczema, Urticaria, Colon cancer, Rectal cancer, Stomach cancer, Pancreatic cancer, or Liver cancer.  ROS:   ROS Please see the history of present illness.    *** All other systems reviewed and are negative.  EKGs/Labs/Other Studies Reviewed:    The following studies were reviewed today: ***      EKG:  EKG is *** ordered today.  The ekg ordered today is personally reviewed and demonstrates ***  Recent Labs: 02/22/2023: ALT 15 04/01/2023: B Natriuretic Peptide 11.7; BUN 11; Creatinine, Ser 0.78; Hemoglobin 13.8; Platelets 234; Potassium 3.8; Sodium 142  Recent Lipid Panel    Component Value Date/Time   CHOL 197 12/15/2022 0845   TRIG 129.0 12/15/2022 0845   HDL 57.50 12/15/2022 0845   CHOLHDL 3 12/15/2022 0845   VLDL 25.8 12/15/2022 0845   LDLCALC 114 (H) 12/15/2022 0845   LDLDIRECT 103.0 12/05/2020 0846    Physical Exam:    VS:  LMP 09/15/2020     Wt Readings from Last 3 Encounters:  04/01/23 155 lb (70.3 kg)  03/23/23 160 lb (72.6 kg)  02/22/23 158 lb 6.4 oz (71.8 kg)     GEN: *** Well nourished, well developed in no acute distress HEENT: Normal NECK: No JVD; No carotid bruits LYMPHATICS: No lymphadenopathy CARDIAC: ***RRR, no murmurs, rubs, gallops RESPIRATORY:  Clear to auscultation without rales, wheezing or rhonchi  ABDOMEN: Soft, non-tender, non-distended MUSCULOSKELETAL:  No edema; No deformity  SKIN: Warm and dry NEUROLOGIC:  Alert and oriented x 3 PSYCHIATRIC:  Normal  affect     Signed, Norman Herrlich, MD  04/04/2023 1:49 PM    [facility]

## 2023-04-05 ENCOUNTER — Ambulatory Visit: Payer: BC Managed Care – PPO | Attending: Cardiology

## 2023-04-05 ENCOUNTER — Ambulatory Visit: Payer: BC Managed Care – PPO | Admitting: Cardiology

## 2023-04-05 ENCOUNTER — Encounter: Payer: Self-pay | Admitting: Cardiology

## 2023-04-05 VITALS — BP 122/78 | HR 76 | Ht 63.0 in | Wt 158.2 lb

## 2023-04-05 DIAGNOSIS — I2089 Other forms of angina pectoris: Secondary | ICD-10-CM

## 2023-04-05 DIAGNOSIS — R6889 Other general symptoms and signs: Secondary | ICD-10-CM

## 2023-04-05 DIAGNOSIS — R079 Chest pain, unspecified: Secondary | ICD-10-CM

## 2023-04-05 DIAGNOSIS — R0602 Shortness of breath: Secondary | ICD-10-CM

## 2023-04-05 LAB — ECHOCARDIOGRAM COMPLETE
Area-P 1/2: 3.48 cm2
Height: 63 in
S' Lateral: 2.5 cm
Weight: 2531.2 [oz_av]

## 2023-04-05 MED ORDER — METOPROLOL TARTRATE 100 MG PO TABS: 100.0000 mg | ORAL_TABLET | Freq: Once | ORAL | 0 refills | Status: DC

## 2023-04-05 NOTE — Patient Instructions (Signed)
Medication Instructions:  Your physician recommends that you continue on your current medications as directed. Please refer to the Current Medication list given to you today.  *If you need a refill on your cardiac medications before your next appointment, please call your pharmacy*   Lab Work: None If you have labs (blood work) drawn today and your tests are completely normal, you will receive your results only by: MyChart Message (if you have MyChart) OR A paper copy in the mail If you have any lab test that is abnormal or we need to change your treatment, we will call you to review the results.   Testing/Procedures: Your physician has requested that you have an echocardiogram. Echocardiography is a painless test that uses sound waves to create images of your heart. It provides your doctor with information about the size and shape of your heart and how well your heart's chambers and valves are working. This procedure takes approximately one hour. There are no restrictions for this procedure. Please do NOT wear cologne, perfume, aftershave, or lotions (deodorant is allowed). Please arrive 15 minutes prior to your appointment time.    Your cardiac CT will be scheduled at one of the below locations:   [facility] [address] [place], [place] [address] [phone]  OR  [facility] [address] [place], [place] [address] [phone]  OR   [facility] [address] [place], [place] [address] [phone]  If scheduled at [facility], please arrive at the [facility] and Children's Entrance (Entrance C2) of [facility] 30 minutes prior to test start time. You can use the FREE valet parking offered at entrance C (encouraged to control the heart rate for the test)  Proceed to the [facility] Radiology Department (first floor) to check-in and test prep.  All radiology patients  and guests should use entrance C2 at [facility], accessed from [facility], even though the hospital's physical address listed is [address].    If scheduled at [facility] or [facility], please arrive 15 mins early for check-in and test prep.  There is spacious parking and easy access to the radiology department from the [facility] Heart and Vascular entrance. Please enter here and check-in with the desk attendant.   Please follow these instructions carefully (unless otherwise directed):  An IV will be required for this test and Nitroglycerin will be given.  Hold all erectile dysfunction medications at least 3 days (72 hrs) prior to test. (Ie viagra, cialis, sildenafil, tadalafil, etc)   On the Night Before the Test: Be sure to Drink plenty of water. Do not consume any caffeinated/decaffeinated beverages or chocolate 12 hours prior to your test. Do not take any antihistamines 12 hours prior to your test.  On the Day of the Test: Drink plenty of water until 1 hour prior to the test. Do not eat any food 1 hour prior to test. You may take your regular medications prior to the test.  Take metoprolol (Lopressor) two hours prior to test. FEMALES- please wear underwire-free bra if available, avoid dresses & tight clothing       After the Test: Drink plenty of water. After receiving IV contrast, you may experience a mild flushed feeling. This is normal. On occasion, you may experience a mild rash up to 24 hours after the test. This is not dangerous. If this occurs, you can take  Benadryl 25 mg and increase your fluid intake. If you experience trouble breathing, this can be serious. If it is severe call 911 IMMEDIATELY. If it is mild, please call our office.  We will call to schedule your test 2-4 weeks out understanding that some insurance companies will need an authorization prior to the service being performed.    For more information and frequently asked questions, please visit our website : [email or web address]  For non-scheduling related questions, please contact the cardiac imaging nurse navigator should you have any questions/concerns: Cardiac Imaging Nurse Navigators Direct Office Dial: [phone]   For scheduling needs, including cancellations and rescheduling, please call [place], [phone].    Follow-Up: At [facility], you and your health needs are our priority.  As part of our continuing mission to provide you with exceptional heart care, we have created designated Provider Care Teams.  These Care Teams include your primary Cardiologist (physician) and Advanced Practice Providers (APPs -  Physician Assistants and Nurse Practitioners) who all work together to provide you with the care you need, when you need it.  We recommend signing up for the patient portal called "MyChart".  Sign up information is provided on this After Visit Summary.  MyChart is used to connect with patients for Virtual Visits (Telemedicine).  Patients are able to view lab/test results, encounter notes, upcoming appointments, etc.  Non-urgent messages can be sent to your provider as well.   To learn more about what you can do with MyChart, go to [email or web address].    Your next appointment:   6 week(s)  Provider:   Norman Herrlich, MD    Other Instructions None

## 2023-04-06 ENCOUNTER — Ambulatory Visit: Payer: BC Managed Care – PPO

## 2023-04-08 ENCOUNTER — Encounter: Payer: Self-pay | Admitting: Cardiology

## 2023-04-11 ENCOUNTER — Encounter (HOSPITAL_COMMUNITY): Payer: Self-pay

## 2023-04-13 ENCOUNTER — Ambulatory Visit (HOSPITAL_COMMUNITY)
Admission: RE | Admit: 2023-04-13 | Discharge: 2023-04-13 | Disposition: A | Discharge status: Home / Self Care | Payer: BC Managed Care – PPO | Source: Ambulatory Visit | Attending: Cardiology | Admitting: Cardiology

## 2023-04-13 DIAGNOSIS — I2089 Other forms of angina pectoris: Secondary | ICD-10-CM | POA: Diagnosis not present

## 2023-04-13 DIAGNOSIS — I2510 Atherosclerotic heart disease of native coronary artery without angina pectoris: Secondary | ICD-10-CM | POA: Diagnosis not present

## 2023-04-13 MED ORDER — NITROGLYCERIN 0.4 MG SL SUBL
0.8000 mg | SUBLINGUAL_TABLET | Freq: Once | SUBLINGUAL | Status: AC
  Administered 2023-04-13: 0.8 mg via SUBLINGUAL

## 2023-04-13 MED ORDER — NITROGLYCERIN 0.4 MG SL SUBL
SUBLINGUAL_TABLET | SUBLINGUAL | Status: AC
  Filled 2023-04-13: qty 2

## 2023-04-13 MED ORDER — IOHEXOL 350 MG/ML SOLN
100.0000 mL | Freq: Once | INTRAVENOUS | Status: AC | PRN
  Administered 2023-04-13: 100 mL via INTRAVENOUS

## 2023-04-14 ENCOUNTER — Encounter: Payer: Self-pay | Admitting: Cardiology

## 2023-04-25 ENCOUNTER — Encounter: Payer: Self-pay | Admitting: Cardiology

## 2023-04-27 DIAGNOSIS — G4700 Insomnia, unspecified: Secondary | ICD-10-CM | POA: Diagnosis not present

## 2023-04-27 DIAGNOSIS — N951 Menopausal and female climacteric states: Secondary | ICD-10-CM | POA: Diagnosis not present

## 2023-04-27 DIAGNOSIS — E559 Vitamin D deficiency, unspecified: Secondary | ICD-10-CM | POA: Diagnosis not present

## 2023-04-27 DIAGNOSIS — E538 Deficiency of other specified B group vitamins: Secondary | ICD-10-CM | POA: Diagnosis not present

## 2023-05-03 ENCOUNTER — Ambulatory Visit: Payer: BC Managed Care – PPO

## 2023-05-04 ENCOUNTER — Telehealth: Payer: Self-pay | Admitting: Cardiology

## 2023-05-04 NOTE — Telephone Encounter (Signed)
Patient called to follow-up on CT Cardiac Morphology test results.

## 2023-05-10 NOTE — Telephone Encounter (Signed)
Called the patient and explained that we have received the over read for the cardiac CTA but Dr. Dulce Sellar has not interpreted it at this time. Once Dr. Dulce Sellar interprets the over read for her cardiac CTA we will call her with the results. Patient verbalized understanding and had no further questions at this time.

## 2023-05-15 NOTE — Progress Notes (Unsigned)
Cardiology Office Note:    Date:  05/17/2023   ID:  Theresa Gonzalez, DOB [birth year], MRN [id number]  PCP:  Loyola Mast, MD  Cardiologist:  Norman Herrlich, MD    Referring MD: Loyola Mast, MD her cardiac CTA, and a mildly prominent lymph node at the aorta coronary window nonspecific and then recommended a follow-up chest CT with IV contrast.  Please follow-up on this   ASSESSMENT:    1. SOB (shortness of breath) on exertion    PLAN:    In order of problems listed above:  Reassuring cardiac testing I suspect she is having intermittent asthma at this time Minimal coronary atherosclerosis coronary score of 0   Next appointment: As needed   Medication Adjustments/Labs and Tests Ordered: Current medicines are reviewed at length with the patient today.  Concerns regarding medicines are outlined above.  No orders of the defined types were placed in this encounter.  No orders of the defined types were placed in this encounter.    History of Present Illness:    Theresa Gonzalez is a 57 y.o. female with a hx of stage III CKD and fatigue chest pain and palpitation occurring during and after mission trip to [place] last seen 04/05/2023.  Following the visit and echocardiogram reported 04/05/2023 showing normal left ventricular size wall thickness systolic function EF 60 to 65% diastolic function normal GLS normal -22.0.  Right ventricle normal size and function and no valvular abnormality.  She had a cardiac CTA performed 04/13/2023 with a calcium score of 0 and minimal coronary atherosclerosis 0 to 24% proximal right coronary artery.  There is also mildly prominent lymph node enlargement nonspecific.  Compliance with diet, lifestyle and medications: Yes  She has had a remarkable improvement pretty much back to normal but intermittently she has coughing she will find himself breathless and has background history of asthma. She is reassured by the results of her cardiac CTA and  echocardiogram. No further chest discomfort edema palpitation or syncope I reviewed greater than 20-30 strips mobile Kardia sinus rhythm sinus tachycardia no atrial fibrillation  Past Medical History:  Diagnosis Date   Acute medial meniscus tear of right knee 12/15/2021   Angio-edema    Arthritis of ankle or foot, degenerative, right 03/16/2021   Asthma    Chronic rhinitis 02/26/2021   Cystic disease of liver 07/08/2021   DUB (dysfunctional uterine bleeding) 11/28/2020   Facet arthropathy, lumbar 11/28/2020   Gastroesophageal reflux disease 02/26/2021   History of kidney stones 07/02/2021   Menopausal state 11/27/2021   Mesenteric cyst 07/08/2021   Mild intermittent asthma, uncomplicated 11/28/2020   Mixed hyperlipidemia 02/04/2011   Overactive bladder 09/06/2022   Pain in left ankle and joints of left foot 01/19/2022   Peroneal tendinitis of left lower leg    Pneumonia    PONV (postoperative nausea and vomiting)    Prediabetes 02/27/2021   Recurrent upper respiratory infection (URI)    RLS (restless legs syndrome) 11/28/2020   SK (seborrheic keratosis) 11/28/2020   Stage 3a chronic kidney disease (CKD) (HCC) 03/23/2023   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   pramipexole (MIRAPEX) 1 MG tablet TAKE 1 TABLET BY MOUTH AT BEDTIME.  progesterone (PROMETRIUM) 200 MG capsule Take 400 mg by mouth at bedtime.      EKGs/Labs/Other Studies Reviewed:    The following studies were reviewed today:  Cardiac Studies & Procedures       ECHOCARDIOGRAM  ECHOCARDIOGRAM COMPLETE 04/05/2023  Narrative ECHOCARDIOGRAM REPORT    Patient Name:   [facility] Date of Exam:  04/05/2023 Medical Rec #:  [id number]     Height:       63.0 in Accession #:    [id number]    Weight:       158.0 lb Date of Birth:  19-Jan-1966     BSA:          1.749 m Patient Age:    57 years      BP:           122/78 mmHg Patient Gender: F             HR:           67 bpm. Exam Location:  [place]  Procedure: 2D Echo, Cardiac Doppler, Color Doppler and Strain Analysis  Indications:    Chest pain of uncertain etiology [R07.9 (ICD-10-CM)]  History:        Patient has no prior history of Echocardiogram examinations. Signs/Symptoms:Chest Pain and Dyspnea.  Sonographer:    Margreta Journey RDCS Referring Phys: Norman Herrlich MD  IMPRESSIONS   1. Left ventricular ejection fraction, by estimation, is 60 to 65%. The left ventricle has normal function. The left ventricle has no regional wall motion abnormalities. Left ventricular diastolic parameters were normal. The average left ventricular global longitudinal strain is -22.0 %. The global longitudinal strain is normal. 2. Right ventricular systolic function is normal. The right ventricular size is normal. 3. The mitral valve is grossly normal. No evidence of mitral valve regurgitation. No evidence of mitral stenosis. 4. The aortic valve is tricuspid. Aortic valve regurgitation is not visualized. No aortic stenosis is present. 5. The inferior vena cava is normal in size with greater than 50% respiratory variability, suggesting right atrial pressure of 3 mmHg.  Comparison(s): No prior Echocardiogram.  FINDINGS Left Ventricle: Left ventricular ejection fraction, by estimation, is 60 to 65%. The left ventricle has normal function. The left ventricle has no regional wall motion abnormalities. The average left ventricular global longitudinal strain is -22.0 %. The global longitudinal strain is normal. The left ventricular internal cavity size was normal in size. There is no left ventricular hypertrophy. Left ventricular diastolic parameters were  normal.  Right Ventricle: The right ventricular size is normal. No increase in right ventricular wall thickness. Right ventricular systolic function is normal.  Left Atrium: Left atrial size was normal in size.  Right Atrium: Right atrial size was normal in size.  Pericardium: There is no evidence of pericardial effusion.  Mitral Valve: The mitral valve is grossly normal. No evidence of mitral valve regurgitation. No evidence of mitral valve stenosis.  Tricuspid Valve: The tricuspid valve is not well visualized. Tricuspid valve regurgitation is not demonstrated. No evidence of tricuspid stenosis.  Aortic Valve: The aortic valve is tricuspid. Aortic valve regurgitation is not visualized. No aortic stenosis is present.  Pulmonic Valve: The pulmonic valve was not well visualized. Pulmonic valve regurgitation is not visualized. No evidence of pulmonic stenosis.  Aorta: The aortic root is normal in size and structure.  Venous: The inferior vena cava is normal in size with greater than 50% respiratory variability, suggesting right atrial pressure of 3 mmHg.  IAS/Shunts: The interatrial  septum was not well visualized.   LEFT VENTRICLE PLAX 2D LVIDd:         4.20 cm   Diastology LVIDs:         2.50 cm   LV e' medial:    9.85 cm/s LV PW:         0.90 cm   LV E/e' medial:  10.1 LV IVS:        0.90 cm   LV e' lateral:   12.60 cm/s LVOT diam:     1.90 cm   LV E/e' lateral: 7.9 LV SV:         65 LV SV Index:   37        2D Longitudinal Strain LVOT Area:     2.84 cm  2D Strain GLS Avg:     -22.0 %   RIGHT VENTRICLE             IVC RV Basal diam:  3.00 cm     IVC diam: 1.60 cm RV Mid diam:    2.50 cm RV S prime:     19.70 cm/s TAPSE (M-mode): 3.1 cm  LEFT ATRIUM             Index        RIGHT ATRIUM           Index LA diam:        3.30 cm 1.89 cm/m   RA Area:     11.80 cm LA Vol (A2C):   39.3 ml 22.46 ml/m  RA Volume:   23.40 ml  13.38 ml/m LA Vol (A4C):   75.6 ml 43.21 ml/m LA  Biplane Vol: 57.8 ml 33.04 ml/m AORTIC VALVE LVOT Vmax:   104.00 cm/s LVOT Vmean:  69.800 cm/s LVOT VTI:    0.228 m  AORTA Ao Root diam: 3.50 cm Ao Asc diam:  3.10 cm Ao Desc diam: 2.00 cm  MITRAL VALVE MV Area (PHT): 3.48 cm    SHUNTS MV Decel Time: 218 msec    Systemic VTI:  0.23 m MV E velocity: 99.85 cm/s  Systemic Diam: 1.90 cm MV A velocity: 73.00 cm/s MV E/A ratio:  1.37  Sreedhar reddy Madireddy Electronically signed by Vern Claude reddy Madireddy Signature Date/Time: 04/05/2023/4:37:01 PM    Final     CT SCANS  CT CORONARY MORPH W/CTA COR W/SCORE 04/13/2023  Addendum 05/07/2023  8:48 AM ADDENDUM REPORT: 05/07/2023 08:46  EXAM: OVER-READ INTERPRETATION  CT CHEST  The following report is an over-read performed by radiologist Dr. Nadara Eaton [facility] on 05/07/2023. This over-read does not include interpretation of cardiac or coronary anatomy or pathology. The coronary CTA interpretation by the cardiologist is attached.  COMPARISON:  CT renal stone protocol 06/06/2022 and CT 07/08/2021  FINDINGS: Mildly prominent lymph node at the aortopulmonary window on the non contrast images measures roughly 1.1 cm in the short axis on image 2/3. Mediastinum is incompletely evaluated on this cardiac examination. Small hiatal hernia. Limited evaluation of the pulmonary arteries on this exam due to the phase of contrast. Again noted are adjacent cysts or bilobed cystic structure in the left hepatic lobe. Probable additional cyst in the central aspect of the liver. A 2 mm nodule in left lower lobe on image 35/12 is likely stable since 07/08/2021. No significant airspace disease or consolidation at the lung bases. Mild dependent changes at the lung bases. Again noted is a focus of sclerosis involving the right transverse process of a lower thoracic  vertebral body on image 25/2. This sclerotic focus is relatively well-circumscribed and suspect this is benign  etiology such as a bone island. No acute bone abnormality.  IMPRESSION: 1. No acute extracardiac findings. 2. Small hiatal hernia. 3. Mildly prominent lymph node at the aortopulmonary window. This is nonspecific. Consider follow-up chest CT with IV contrast for further characterization.   Electronically Signed By: Richarda Overlie M.D. On: 05/07/2023 08:46  Narrative CLINICAL DATA:  60F with DOE  EXAM: Cardiac/Coronary CTA  TECHNIQUE: The patient was scanned on a [employer].  FINDINGS: A 100 kV prospective scan was triggered in the descending thoracic aorta at 111 HU's. Axial non-contrast 3 mm slices were carried out through the heart. The data set was analyzed on a dedicated work station and scored using the Agatson method. Gantry rotation speed was 250 msecs and collimation was .6 mm. No beta blockade and 0.8 mg of sl NTG was given. The 3D data set was reconstructed in 5% intervals of the 35-75% of the R-R cycle. Phases were analyzed on a dedicated work station using MPR, MIP and VRT modes. The patient received 100 cc of contrast.  Coronary Arteries:  Normal coronary origin.  Right dominance.  RCA is a large dominant artery that gives rise to PDA and PLA. Noncalcified plaque in proximal RCA causes 0-24% stenosis  Left main is a large artery that gives rise to LAD and LCX arteries.  LAD is a large vessel that has no plaque.  LCX is a non-dominant artery that gives rise to one large OM1 branch. There is no plaque.  Other findings:  Left Ventricle: Normal size  Left Atrium: Normal size  Pulmonary Veins: Normal configuration  Right Ventricle: Normal size  Right Atrium: Normal size  Cardiac valves: No calcifications  Thoracic aorta: Normal size  Pulmonary Arteries: Normal size  Systemic Veins: Normal drainage  Pericardium: Normal thickness  IMPRESSION: 1. Coronary calcium score of 0.  2. Total plaque volume 43mm3 which is 51st percentile for  age and sex-matched controls (calcified plaque 26mm3; noncalcified plaque 59mm3). TPV is mild  3. Normal coronary origin with right dominance.  4. Nonobstructive CAD, with noncalcified plaque in proximal RCA causing minimal (0-24%) stenosis  CAD-RADS 1. Minimal non-obstructive CAD (0-24%). Consider non-atherosclerotic causes of chest pain. Consider preventive therapy and risk factor modification.  Electronically Signed: By: Epifanio Lesches M.D. On: 04/13/2023 17:23              Recent Labs: 02/22/2023: ALT 15 04/01/2023: B Natriuretic Peptide 11.7; BUN 11; Creatinine, Ser 0.78; Hemoglobin 13.8; Platelets 234; Potassium 3.8; Sodium 142  Recent Lipid Panel    Component Value Date/Time   CHOL 197 12/15/2022 0845   TRIG 129.0 12/15/2022 0845   HDL 57.50 12/15/2022 0845   CHOLHDL 3 12/15/2022 0845   VLDL 25.8 12/15/2022 0845   LDLCALC 114 (H) 12/15/2022 0845   LDLDIRECT 103.0 12/05/2020 0846    Physical Exam:    VS:  BP (!) 144/88 (BP Location: Right Arm, Patient Position: Sitting, Cuff Size: Normal)   Pulse 82   Ht 5\' 3"  (1.6 m)   Wt 163 lb 3.2 oz (74 kg)   LMP 09/15/2020   SpO2 96%   BMI 28.91 kg/m     Wt Readings from Last 3 Encounters:  05/17/23 163 lb 3.2 oz (74 kg)  04/05/23 158 lb 3.2 oz (71.8 kg)  04/01/23 155 lb (70.3 kg)     GEN: Marked improvement she appears healthy well nourished,  well developed in no acute distress HEENT: Normal NECK: No JVD; No carotid bruits LYMPHATICS: No lymphadenopathy CARDIAC: RRR, no murmurs, rubs, gallops RESPIRATORY:  Clear to auscultation without rales, wheezing or rhonchi  ABDOMEN: Soft, non-tender, non-distended MUSCULOSKELETAL:  No edema; No deformity  SKIN: Warm and dry NEUROLOGIC:  Alert and oriented x 3 PSYCHIATRIC:  Normal affect    Signed, Norman Herrlich, MD  05/17/2023 3:27 PM    [facility]

## 2023-05-16 ENCOUNTER — Other Ambulatory Visit: Payer: Self-pay

## 2023-05-16 DIAGNOSIS — R112 Nausea with vomiting, unspecified: Secondary | ICD-10-CM | POA: Insufficient documentation

## 2023-05-16 DIAGNOSIS — L509 Urticaria, unspecified: Secondary | ICD-10-CM | POA: Insufficient documentation

## 2023-05-16 DIAGNOSIS — J069 Acute upper respiratory infection, unspecified: Secondary | ICD-10-CM | POA: Insufficient documentation

## 2023-05-16 DIAGNOSIS — T783XXA Angioneurotic edema, initial encounter: Secondary | ICD-10-CM | POA: Insufficient documentation

## 2023-05-16 DIAGNOSIS — Z9889 Other specified postprocedural states: Secondary | ICD-10-CM | POA: Insufficient documentation

## 2023-05-16 DIAGNOSIS — J189 Pneumonia, unspecified organism: Secondary | ICD-10-CM | POA: Insufficient documentation

## 2023-05-16 DIAGNOSIS — J45909 Unspecified asthma, uncomplicated: Secondary | ICD-10-CM | POA: Insufficient documentation

## 2023-05-17 ENCOUNTER — Encounter: Payer: Self-pay | Admitting: Cardiology

## 2023-05-17 ENCOUNTER — Ambulatory Visit: Payer: BC Managed Care – PPO | Attending: Cardiology | Admitting: Cardiology

## 2023-05-17 VITALS — BP 144/88 | HR 82 | Ht 63.0 in | Wt 163.2 lb

## 2023-05-17 DIAGNOSIS — R0602 Shortness of breath: Secondary | ICD-10-CM | POA: Diagnosis not present

## 2023-05-17 NOTE — Patient Instructions (Signed)
Medication Instructions:  Your physician recommends that you continue on your current medications as directed. Please refer to the Current Medication list given to you today.  *If you need a refill on your cardiac medications before your next appointment, please call your pharmacy*   Lab Work: None If you have labs (blood work) drawn today and your tests are completely normal, you will receive your results only by: MyChart Message (if you have MyChart) OR A paper copy in the mail If you have any lab test that is abnormal or we need to change your treatment, we will call you to review the results.   Testing/Procedures: None   Follow-Up: At [facility], you and your health needs are our priority.  As part of our continuing mission to provide you with exceptional heart care, we have created designated Provider Care Teams.  These Care Teams include your primary Cardiologist (physician) and Advanced Practice Providers (APPs -  Physician Assistants and Nurse Practitioners) who all work together to provide you with the care you need, when you need it.  We recommend signing up for the patient portal called "MyChart".  Sign up information is provided on this After Visit Summary.  MyChart is used to connect with patients for Virtual Visits (Telemedicine).  Patients are able to view lab/test results, encounter notes, upcoming appointments, etc.  Non-urgent messages can be sent to your provider as well.   To learn more about what you can do with MyChart, go to [email or web address].    Your next appointment:   Follow up as needed  Provider:   Brian Munley, MD    Other Instructions None  

## 2023-06-06 ENCOUNTER — Ambulatory Visit: Payer: BC Managed Care – PPO | Admitting: Internal Medicine

## 2023-06-06 VITALS — BP 128/74 | HR 83 | Temp 98.2°F | Ht 63.0 in | Wt 163.2 lb

## 2023-06-06 DIAGNOSIS — J40 Bronchitis, not specified as acute or chronic: Secondary | ICD-10-CM | POA: Diagnosis not present

## 2023-06-06 LAB — POC COVID19 BINAXNOW: SARS Coronavirus 2 Ag: NEGATIVE

## 2023-06-06 LAB — POCT INFLUENZA A/B
Influenza A, POC: NEGATIVE
Influenza B, POC: NEGATIVE

## 2023-06-06 LAB — POCT RAPID STREP A (OFFICE): Rapid Strep A Screen: NEGATIVE

## 2023-06-06 MED ORDER — AZITHROMYCIN 250 MG PO TABS
ORAL_TABLET | ORAL | 0 refills | Status: AC
Start: 2023-06-06 — End: 2023-06-11

## 2023-06-06 MED ORDER — TRIAMCINOLONE ACETONIDE 40 MG/ML IJ SUSP
40.0000 mg | Freq: Once | INTRAMUSCULAR | Status: AC
Start: 2023-06-06 — End: 2023-06-06
  Administered 2023-06-06: 40 mg via INTRAMUSCULAR

## 2023-06-06 MED ORDER — PROMETHAZINE-DM 6.25-15 MG/5ML PO SYRP: 5.0000 mL | ORAL_SOLUTION | Freq: Four times a day (QID) | ORAL | 0 refills | Status: DC | PRN

## 2023-06-06 MED ORDER — ALBUTEROL SULFATE HFA 108 (90 BASE) MCG/ACT IN AERS: 2.0000 | INHALATION_SPRAY | Freq: Four times a day (QID) | RESPIRATORY_TRACT | 2 refills | Status: DC | PRN

## 2023-06-06 NOTE — Progress Notes (Signed)
[facility] PRIMARY CARE [facility] [address] [place] [address] Dept: [phone] Dept Fax: [phone]  Acute Care Office Visit  Subjective:   Theresa Gonzalez 07-Mar-1966 06/06/2023  Chief Complaint  Patient presents with   Cough    Cough and sore throat onset 06/05/2023 Runny nose onset 06/03/2023    HPI: Discussed the use of AI scribe software for clinical note transcription with the patient, who gave verbal consent to proceed.  History of Present Illness   The patient, with a history of asthma and recurrent respiratory infections, presents with respiratory symptoms that began on Friday with a runny nose. Over the weekend, she developed a sore throat and a cough.  She has a history of her cough quickly progressing to bronchitis. She denies fever but reports an episode of shivering despite not feeling cold. She has not noticed any sinus pressure but feels postnasal drainage. She had a brief earache in the left ear that has since resolved. The cough is productive and has been worsening, but she denies shortness of breath, chest pain, and wheezing. She attempted to manage her symptoms with Benadryl without relief.       The following portions of the patient's history were reviewed and updated as appropriate: past medical history, past surgical history, family history, social history, allergies, medications, and problem list.   Patient Active Problem List   Diagnosis Date Noted   Angio-edema    Asthma    Pneumonia    PONV (postoperative nausea and vomiting)    Recurrent upper respiratory infection (URI)    Urticaria    Stage 3a chronic kidney disease (CKD) (HCC) 03/23/2023   Overactive bladder 09/06/2022   Peroneal tendinitis of left lower leg    Unilateral primary osteoarthritis, right knee 01/19/2022   Pain in left ankle and joints of left foot 01/19/2022   Acute medial meniscus tear of right knee 12/15/2021   Menopausal state  11/27/2021   Cystic disease of liver 07/08/2021   Mesenteric cyst 07/08/2021   History of kidney stones 07/02/2021   Arthritis of ankle or foot, degenerative, right 03/16/2021   Prediabetes 02/27/2021   Chronic rhinitis 02/26/2021   Gastroesophageal reflux disease 02/26/2021   DUB (dysfunctional uterine bleeding) 11/28/2020   Facet arthropathy, lumbar 11/28/2020   Mild intermittent asthma, uncomplicated 11/28/2020   RLS (restless legs syndrome) 11/28/2020   SK (seborrheic keratosis) 11/28/2020   Vitamin D insufficiency 11/28/2020   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, 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   Mixed hyperlipidemia 02/04/2011   Past Medical History:  Diagnosis Date   Acute medial meniscus tear of right knee 12/15/2021   Angio-edema    Arthritis of ankle or foot, degenerative, right 03/16/2021   Asthma    Chronic rhinitis 02/26/2021   Cystic disease of liver 07/08/2021   DUB (dysfunctional uterine bleeding) 11/28/2020   Facet arthropathy, lumbar 11/28/2020   Gastroesophageal reflux disease 02/26/2021   History of kidney stones 07/02/2021   Menopausal state 11/27/2021   Mesenteric cyst 07/08/2021   Mild intermittent asthma, uncomplicated 11/28/2020   Mixed hyperlipidemia 02/04/2011   Overactive bladder 09/06/2022   Pain in left ankle and joints of left foot 01/19/2022   Peroneal tendinitis of left lower leg    Pneumonia    PONV (postoperative nausea and vomiting)    Prediabetes 02/27/2021   Recurrent upper respiratory infection (URI)    RLS (restless legs syndrome) 11/28/2020   SK (seborrheic keratosis) 11/28/2020   Stage 3a chronic  kidney disease (CKD) (HCC) 03/23/2023   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, 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   Unilateral primary osteoarthritis, right knee 01/19/2022   Urticaria    Vitamin D insufficiency 11/28/2020   Past Surgical History:  Procedure Laterality Date   ANKLE SURGERY     BREAST  EXCISIONAL BIOPSY Right    HERNIA REPAIR Bilateral    knee replacement Left 06/2019   LAPAROSCOPIC LYSIS OF ADHESIONS     TUBAL LIGATION     Family History  Problem Relation Age of Onset   Hypertension Mother    Hyperlipidemia Mother    Retinitis pigmentosa Mother    Heart disease Father    Diabetes Father    Prostate cancer Father    Cancer - Prostate Father    Diabetes Brother    Heart attack Maternal Grandmother    Stroke Maternal Grandfather    Heart attack Maternal Grandfather    Heart disease Paternal Grandmother    Epilepsy Paternal Grandmother    Diabetes Paternal Grandfather    Retinitis pigmentosa Maternal Aunt    Allergic rhinitis Neg Hx    Asthma Neg Hx    Eczema Neg Hx    Urticaria Neg Hx    Colon cancer Neg Hx    Rectal cancer Neg Hx    Stomach cancer Neg Hx    Pancreatic cancer Neg Hx    Liver cancer Neg Hx     Current Outpatient Medications:    albuterol (VENTOLIN HFA) 108 (90 Base) MCG/ACT inhaler, Inhale 2 puffs into the lungs every 6 (six) hours as needed for wheezing or shortness of breath., Disp: 18 g, Rfl: 2   azithromycin (ZITHROMAX) 250 MG tablet, Take 2 tablets on day 1, then 1 tablet daily on days 2 through 5, Disp: 6 tablet, Rfl: 0   estradiol (ESTRACE) 1 MG tablet, Take 1 mg by mouth every morning., Disp: , Rfl:    pramipexole (MIRAPEX) 1 MG tablet, TAKE 1 TABLET BY MOUTH AT BEDTIME., Disp: 90 tablet, Rfl: 3   progesterone (PROMETRIUM) 200 MG capsule, Take 400 mg by mouth at bedtime., Disp: , Rfl:    promethazine-dextromethorphan (PROMETHAZINE-DM) 6.25-15 MG/5ML syrup, Take 5 mLs by mouth 4 (four) times daily as needed for cough., Disp: 180 mL, Rfl: 0   estradiol (VIVELLE-DOT) 0.1 MG/24HR patch, Place 1 patch onto the skin 2 (two) times a week., Disp: , Rfl:  Allergies  Allergen Reactions   Penicillins Shortness Of Breath, Swelling and Rash   Potassium Citrate Itching   Ropinirole Shortness Of Breath   Cetirizine Hcl Other (See Comments)     Blood Pressure dropped   Nabumetone Rash   Nsaids Rash    Provider: Valere Dross CFM - Allergy Description: NSAIDs CFM - Allergy Annotation: rash   Sodium Hyaluronate (Non-Avian) Hives   Oxybutynin Nausea Only     ROS: A complete ROS was performed with pertinent positives/negatives noted in the HPI. The remainder of the ROS are negative.    Objective:   Today's Vitals   06/06/23 0945  BP: 128/74  Pulse: 83  Temp: 98.2 F (36.8 C)  TempSrc: Temporal  SpO2: 98%  Weight: 163 lb 3.2 oz (74 kg)  Height: 5\' 3"  (1.6 m)    GENERAL: Well-appearing, in NAD. Well nourished.  SKIN: Pink, warm and dry. No rash, lesion, ulceration, or ecchymoses.  HEENT:    HEAD: Normocephalic, non-traumatic.  EYES: Conjunctive pink without exudate. EARS: External ear w/o redness, swelling, masses, or  lesions. EAC clear. TM's intact, translucent w/o bulging, appropriate landmarks visualized.  NOSE: Septum midline w/o deformity. Nares patent, mucosa pink and non-inflamed with drainage. No sinus tenderness.  THROAT: Uvula midline. Oropharynx with small amount of white PND. Tonsils non-inflamed w/o exudate. Mucus membranes pink and moist.  NECK: Trachea midline. Full ROM w/o pain or tenderness. No lymphadenopathy.  RESPIRATORY: Chest wall symmetrical. Respirations even and non-labored. Breath sounds clear to auscultation bilaterally. (+)cough CARDIAC: S1, S2 present, regular rate and rhythm. Peripheral pulses 2+ bilaterally.  EXTREMITIES: Without clubbing, cyanosis, or edema.  NEUROLOGIC:  Steady, even gait.  PSYCH/MENTAL STATUS: Alert, oriented x 3. Cooperative, appropriate mood and affect.    Results for orders placed or performed in visit on 06/06/23  POC COVID-19 BinaxNow  Result Value Ref Range   SARS Coronavirus 2 Ag Negative Negative  POCT Influenza A/B  Result Value Ref Range   Influenza A, POC Negative Negative   Influenza B, POC Negative Negative  POCT rapid strep A  Result Value Ref Range    Rapid Strep A Screen Negative Negative      Assessment & Plan:  Assessment and Plan    Bronchitis Recent onset of rhinorrhea, sore throat, and productive cough. No fever, chills, or shortness of breath. History of rapid progression to bronchitis. Negative for COVID, flu, and strep. -Start phenergan DM  -Start Mucinex (plain) -Start Z-Pak if symptoms do not improve by Wednesday. - IM Kenalog 40mg  in clinic - Albuterol inhaler refill -Return for follow-up if condition worsens.    Follow-up as needed.      Meds ordered this encounter  Medications   albuterol (VENTOLIN HFA) 108 (90 Base) MCG/ACT inhaler    Sig: Inhale 2 puffs into the lungs every 6 (six) hours as needed for wheezing or shortness of breath.    Dispense:  18 g    Refill:  2    Order Specific Question:   Supervising Provider    Answer:   Garnette Gunner [[id number]]   azithromycin (ZITHROMAX) 250 MG tablet    Sig: Take 2 tablets on day 1, then 1 tablet daily on days 2 through 5    Dispense:  6 tablet    Refill:  0    Order Specific Question:   Supervising Provider    Answer:   Garnette Gunner [[id number]]   triamcinolone acetonide (KENALOG-40) injection 40 mg   promethazine-dextromethorphan (PROMETHAZINE-DM) 6.25-15 MG/5ML syrup    Sig: Take 5 mLs by mouth 4 (four) times daily as needed for cough.    Dispense:  180 mL    Refill:  0    Order Specific Question:   Supervising Provider    Answer:   Garnette Gunner [[id number]]   Orders Placed This Encounter  Procedures   POC COVID-19 BinaxNow    Order Specific Question:   Previously tested for COVID-19    Answer:   Yes    Order Specific Question:   Resident in a congregate (group) care setting    Answer:   No    Order Specific Question:   Employed in healthcare setting    Answer:   Unknown    Order Specific Question:   Pregnant    Answer:   No   POCT Influenza A/B   POCT rapid strep A   Lab Orders         POC COVID-19 BinaxNow         POCT Influenza A/B  POCT rapid strep A     No images are attached to the encounter or orders placed in the encounter.  Return if symptoms worsen or fail to improve.   Salvatore Decent, FNP

## 2023-06-06 NOTE — Patient Instructions (Signed)
Can use Mucinex (plain) for chest congestion

## 2023-07-21 DIAGNOSIS — Z01419 Encounter for gynecological examination (general) (routine) without abnormal findings: Secondary | ICD-10-CM | POA: Diagnosis not present

## 2023-08-18 DIAGNOSIS — H2513 Age-related nuclear cataract, bilateral: Secondary | ICD-10-CM | POA: Diagnosis not present

## 2023-08-18 DIAGNOSIS — R7301 Impaired fasting glucose: Secondary | ICD-10-CM | POA: Diagnosis not present

## 2023-08-18 DIAGNOSIS — G479 Sleep disorder, unspecified: Secondary | ICD-10-CM | POA: Diagnosis not present

## 2023-08-18 DIAGNOSIS — Z83518 Family history of other specified eye disorder: Secondary | ICD-10-CM | POA: Diagnosis not present

## 2023-08-18 DIAGNOSIS — E8881 Metabolic syndrome: Secondary | ICD-10-CM | POA: Diagnosis not present

## 2023-08-18 DIAGNOSIS — D3132 Benign neoplasm of left choroid: Secondary | ICD-10-CM | POA: Diagnosis not present

## 2023-08-18 DIAGNOSIS — E559 Vitamin D deficiency, unspecified: Secondary | ICD-10-CM | POA: Diagnosis not present

## 2023-08-18 DIAGNOSIS — G4700 Insomnia, unspecified: Secondary | ICD-10-CM | POA: Diagnosis not present

## 2023-08-18 DIAGNOSIS — Z1329 Encounter for screening for other suspected endocrine disorder: Secondary | ICD-10-CM | POA: Diagnosis not present

## 2023-08-18 DIAGNOSIS — E612 Magnesium deficiency: Secondary | ICD-10-CM | POA: Diagnosis not present

## 2023-08-25 ENCOUNTER — Emergency Department
Admission: EM | Admit: 2023-08-25 | Discharge: 2023-08-25 | Disposition: A | Discharge status: Home / Self Care | Payer: BC Managed Care – PPO | Attending: Emergency Medicine | Admitting: Emergency Medicine

## 2023-08-25 ENCOUNTER — Encounter: Payer: Self-pay | Admitting: Emergency Medicine

## 2023-08-25 ENCOUNTER — Telehealth: Payer: Self-pay

## 2023-08-25 DIAGNOSIS — K529 Noninfective gastroenteritis and colitis, unspecified: Secondary | ICD-10-CM | POA: Insufficient documentation

## 2023-08-25 DIAGNOSIS — D72829 Elevated white blood cell count, unspecified: Secondary | ICD-10-CM | POA: Diagnosis not present

## 2023-08-25 DIAGNOSIS — R112 Nausea with vomiting, unspecified: Secondary | ICD-10-CM | POA: Diagnosis not present

## 2023-08-25 LAB — COMPREHENSIVE METABOLIC PANEL
ALT: 21 U/L (ref 0–44)
AST: 21 U/L (ref 15–41)
Albumin: 4.2 g/dL (ref 3.5–5.0)
Alkaline Phosphatase: 57 U/L (ref 38–126)
Anion gap: 12 (ref 5–15)
BUN: 19 mg/dL (ref 6–20)
CO2: 20 mmol/L — ABNORMAL LOW (ref 22–32)
Calcium: 9 mg/dL (ref 8.9–10.3)
Chloride: 107 mmol/L (ref 98–111)
Creatinine, Ser: 0.91 mg/dL (ref 0.44–1.00)
GFR, Estimated: >60 mL/min (ref >60)
Glucose, Bld: 146 mg/dL — ABNORMAL HIGH (ref 70–99)
Potassium: 3.8 mmol/L (ref 3.5–5.1)
Sodium: 139 mmol/L (ref 135–145)
Total Bilirubin: 0.9 mg/dL (ref 0.0–1.2)
Total Protein: 6.9 g/dL (ref 6.5–8.1)

## 2023-08-25 LAB — LIPASE, BLOOD: Lipase: 29 U/L (ref 11–51)

## 2023-08-25 LAB — CBC
HCT: 43.7 % (ref 36.0–46.0)
Hemoglobin: 15.2 g/dL — ABNORMAL HIGH (ref 12.0–15.0)
MCH: 29.5 pg (ref 26.0–34.0)
MCHC: 34.8 g/dL (ref 30.0–36.0)
MCV: 84.9 fL (ref 80.0–100.0)
Platelets: 241 10*3/uL (ref 150–400)
RBC: 5.15 MIL/uL — ABNORMAL HIGH (ref 3.87–5.11)
RDW: 13.1 % (ref 11.5–15.5)
WBC: 12.8 10*3/uL — ABNORMAL HIGH (ref 4.0–10.5)
nRBC: 0 % (ref 0.0–0.2)

## 2023-08-25 MED ORDER — ONDANSETRON HCL 4 MG/2ML IJ SOLN
4.0000 mg | INTRAMUSCULAR | Status: AC
Start: 2023-08-25 — End: 2023-08-25
  Administered 2023-08-25: 4 mg via INTRAVENOUS
  Filled 2023-08-25: qty 2

## 2023-08-25 MED ORDER — LACTATED RINGERS IV BOLUS
1000.0000 mL | Freq: Once | INTRAVENOUS | Status: AC
Start: 2023-08-25 — End: 2023-08-25
  Administered 2023-08-25: 1000 mL via INTRAVENOUS

## 2023-08-25 MED ORDER — ONDANSETRON 4 MG PO TBDP: ORAL_TABLET | ORAL | 0 refills | Status: AC

## 2023-08-25 NOTE — ED Triage Notes (Signed)
 Pt c/o consistent N/V/D since last night beginning @ 2000. Pt reports unable to tolerate or keep PO fluids.

## 2023-08-25 NOTE — Discharge Instructions (Addendum)
 We believe your symptoms are caused by either a viral infection or possibly a bad food exposure.  Either way, since your symptoms have improved, we feel it is safe for you to go home and follow up with your regular doctor.  Please read the included information and stick to a bland diet for the next two days.  Drink plenty of clear fluids, and if you were provided with a prescription, please take it according to the label instructions.    If you develop any new or worsening symptoms, including persistent vomiting not controlled with medication, fever greater than 101, severe or worsening abdominal pain, or other symptoms that concern you, please return immediately to the Emergency Department.

## 2023-08-25 NOTE — Transitions of Care (Post Inpatient/ED Visit) (Signed)
   08/25/2023  Name: Theresa Gonzalez MRN: [id number] DOB: 19-Jan-1966  Today's TOC FU Call Status: Today's TOC FU Call Status:: Unsuccessful Call (1st Attempt) Unsuccessful Call (1st Attempt) Date: 08/25/23  Attempted to reach the patient regarding the most recent Inpatient/ED visit.  Follow Up Plan: Additional outreach attempts will be made to reach the patient to complete the Transitions of Care (Post Inpatient/ED visit) call.   Signature [place], [place]

## 2023-08-25 NOTE — ED Provider Notes (Signed)
 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  She has been around numerous patients that have had similar symptoms.  The onset was acute and she has vomited multiple times and had multiple times of loose and watery diarrhea.  She denies any pain per se although sometimes she says that her stomach feels uncomfortable, but is mostly the inability to tolerate any water or other fluids and the output that is worrisome to her.  She feels little bit generally weak.  No lightheadedness or dizziness, has not passed out.  No fever.  She denies chest pain, shortness of breath, and abdominal pain.  No recent dysuria.     Physical Exam   Triage Vital Signs: ED Triage Vitals  Encounter Vitals Group     BP 08/25/23 0353 118/86     Systolic BP Percentile --      Diastolic BP Percentile --      Pulse Rate 08/25/23 0353 (!) 124     Resp 08/25/23 0353 18     Temp 08/25/23 0353 99 F (37.2 C)     Temp Source 08/25/23 0353 Oral     SpO2 08/25/23 0353 99 %     Weight 08/25/23 0351 75 kg (165 lb 5.5 oz)     Height 08/25/23 0351 1.6 m (5\' 3" )     Head Circumference --      Peak Flow --      Pain Score 08/25/23 0351 8     Pain Loc --      Pain Education --      Exclude from Growth Chart --     Most recent vital signs: Vitals:   08/25/23 0500 08/25/23 0600  BP: 122/71 121/84  Pulse: 88 94  Resp: 18   Temp:    SpO2: 97% 95%    General: Awake, appears uncomfortable but not in severe distress. CV:  Good peripheral perfusion.  Regular rate and rhythm. Resp:  Normal effort. Speaking easily and comfortably, no accessory muscle usage nor intercostal retractions.   Abd:  No distention.  No tenderness to palpation  throughout the abdomen with no rebound or guarding.   ED Results / Procedures / Treatments   Labs (all labs ordered are listed, but only abnormal results are displayed) Labs Reviewed  COMPREHENSIVE METABOLIC PANEL - Abnormal; Notable for the following components:      Result Value   CO2 20 (*)    Glucose, Bld 146 (*)    All other components within normal limits  CBC - Abnormal; Notable for the following components:   WBC 12.8 (*)    RBC 5.15 (*)    Hemoglobin 15.2 (*)    All other components within normal limits  LIPASE, BLOOD  URINALYSIS, ROUTINE W REFLEX MICROSCOPIC      PROCEDURES:  Critical Care performed: No  Procedures    IMPRESSION / MDM / ASSESSMENT AND PLAN / ED COURSE  I reviewed the triage vital signs and the nursing notes.                              Differential diagnosis includes, but  is not limited to, norovirus or other viral GI pathogen, foodborne pathogen, acute bacterial intra-abdominal infection such as diverticulitis or appendicitis, UTI/pyelonephritis, acute renal failure or acute kidney injury due to volume depletion.  Patient's presentation is most consistent with acute presentation with potential threat to life or bodily function.  Labs/studies ordered: Lipase, CBC, CMP  Interventions/Medications given:  Medications  ondansetron (ZOFRAN) injection 4 mg (4 mg Intravenous Given 08/25/23 0436)  lactated ringers bolus 1,000 mL (0 mLs Intravenous Stopped 08/25/23 0549)    (Note:  hospital course my include additional interventions and/or labs/studies not listed above.)   Vital signs are stable and within normal limits.  Labs are all essentially normal with a slightly decreased CO2 likely due to the vomiting and diarrhea.  She has a mild leukocytosis of 12.8, again could be viral.  She has absolutely no tenderness to palpation of the abdomen which is quite reassuring, and her symptoms are most consistent with a viral GI pathogen such as norovirus  which is very common amongst the patient population at this time.  Patient agrees with the plan for antiemetics and IV fluids and reassessment.  No indication for imaging currently.   Clinical Course as of 08/25/23 3086  Thu Aug 25, 2023  0611 Patient is feeling much better and is able to tolerate p.o. intake.  Still with no abdominal pain.  She is comfortable with plan for discharge and I think that is reasonable and appropriate.  She has no emergent medical condition that requires hospitalization and is appropriate to try outpatient management of her gastroenteritis.  I gave my usual and customary return precautions.  She agrees with the plan. [CF]    Clinical Course User Index [CF] [name], MD     FINAL CLINICAL IMPRESSION(S) / ED DIAGNOSES   Final diagnoses:  Gastroenteritis     Rx / DC Orders   ED Discharge Orders          Ordered    ondansetron (ZOFRAN-ODT) 4 MG disintegrating tablet        08/25/23 0612             Note:  This document was prepared using Dragon voice recognition software and may include unintentional dictation errors.   [name], MD 08/25/23 [id number]

## 2023-08-25 NOTE — ED Notes (Signed)
 Pt given ice chips

## 2023-08-25 NOTE — ED Notes (Signed)
 ED Provider at bedside.

## 2023-08-26 NOTE — Transitions of Care (Post Inpatient/ED Visit) (Signed)
   08/26/2023  Name: Theresa Gonzalez MRN: [id number] DOB: 01-11-1966  Today's TOC FU Call Status: Today's TOC FU Call Status:: Unsuccessful Call (3rd Attempt) Unsuccessful Call (1st Attempt) Date: 08/25/23 Unsuccessful Call (3rd Attempt) Date: 08/26/23  Attempted to reach the patient regarding the most recent Inpatient/ED visit.  Follow Up Plan: No further outreach attempts will be made at this time. We have been unable to contact the patient.  Signature BMS

## 2023-09-05 NOTE — Telephone Encounter (Signed)
 Pt has a hosp f/up on 09/09/23.

## 2023-09-08 ENCOUNTER — Inpatient Hospital Stay: Admitting: Family Medicine

## 2023-09-09 ENCOUNTER — Ambulatory Visit: Admitting: Family Medicine

## 2023-09-09 VITALS — BP 120/78 | HR 78 | Temp 97.6°F | Ht 63.0 in | Wt 165.8 lb

## 2023-09-09 DIAGNOSIS — G2581 Restless legs syndrome: Secondary | ICD-10-CM

## 2023-09-09 DIAGNOSIS — K529 Noninfective gastroenteritis and colitis, unspecified: Secondary | ICD-10-CM

## 2023-09-09 DIAGNOSIS — G4762 Sleep related leg cramps: Secondary | ICD-10-CM

## 2023-09-09 DIAGNOSIS — K219 Gastro-esophageal reflux disease without esophagitis: Secondary | ICD-10-CM

## 2023-09-09 MED ORDER — CYCLOBENZAPRINE HCL 5 MG PO TABS: 5.0000 mg | ORAL_TABLET | Freq: Three times a day (TID) | ORAL | 1 refills | Status: AC | PRN

## 2023-09-09 MED ORDER — PRAMIPEXOLE DIHYDROCHLORIDE 1 MG PO TABS
1.0000 mg | ORAL_TABLET | Freq: Every day | ORAL | 3 refills | Status: AC
  Filled 2024-03-13: qty 90, 90d supply, fill #0
  Filled 2024-06-12: qty 90, 90d supply, fill #1

## 2023-09-09 MED ORDER — PANTOPRAZOLE SODIUM 40 MG PO TBEC: 40.0000 mg | DELAYED_RELEASE_TABLET | Freq: Every day | ORAL | 0 refills | Status: DC

## 2023-09-09 NOTE — Assessment & Plan Note (Signed)
 I recommend we step up to a PPI for 1 month and then have her return to using her Pepcid.

## 2023-09-09 NOTE — Assessment & Plan Note (Signed)
 Stable. Continue pramipexole 1 mg at bedtime.

## 2023-09-09 NOTE — Assessment & Plan Note (Signed)
 Reviewed recent labs form [facility], all of which were normal. I recommend stretches, massage, and possible use of tonic water (quinine) at bedtime. I will provide a trial of Flexeril to see if this helps on nights when other approaches are not working.

## 2023-09-09 NOTE — Progress Notes (Signed)
 [facility] [address] [place] [address] Dept: [phone] Dept Fax: [phone]  Office Visit  Subjective:    Patient ID: Theresa Gonzalez, female    DOB: July 31, 1965, 58 y.o..   MRN: [id number]  Chief Complaint  Patient presents with   Hospitalization Follow-up    Hospital f/u for Gastroenteritis.  C/o having acid reflex and having leg cramps.  Taking Famotidine.       History of Present Illness:  Patient is in today for follow-up from a recent ED visit at [facility] on 08/25/2023 with an acute gastroenteritis.Theresa Gonzalez notes her diarrhea has resolved and she is tolerating food and fluids well. Since this episode, she has found that her GERD has been flaring. She has not found her usually famotidine to control her symptoms. additionally, she has had increased nocturnal leg cramps, though this has been going on for a while. She ahs been drinking an electrolyte solution, which has helped, but not resolved the issue.  Past Medical History: Patient Active Problem List   Diagnosis Date Noted   Angio-edema    Asthma    Pneumonia    PONV (postoperative nausea and vomiting)    Recurrent upper respiratory infection (URI)    Urticaria    Stage 3a chronic kidney disease (CKD) (HCC) 03/23/2023   Overactive bladder 09/06/2022   Peroneal tendinitis of left lower leg    Unilateral primary osteoarthritis, right knee 01/19/2022   Pain in left ankle and joints of left foot 01/19/2022   Acute medial meniscus tear of right knee 12/15/2021   Menopausal state 11/27/2021   Cystic disease of liver 07/08/2021   Mesenteric cyst 07/08/2021   History of kidney stones 07/02/2021   Arthritis of ankle or foot, degenerative, right 03/16/2021   Prediabetes 02/27/2021   Chronic rhinitis 02/26/2021   Gastroesophageal reflux disease 02/26/2021   DUB (dysfunctional uterine bleeding) 11/28/2020   Facet arthropathy, lumbar 11/28/2020   Mild intermittent  asthma, uncomplicated 11/28/2020   RLS (restless legs syndrome) 11/28/2020   SK (seborrheic keratosis) 11/28/2020   Vitamin D insufficiency 11/28/2020   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, 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   Mixed hyperlipidemia 02/04/2011   Past Surgical History:  Procedure Laterality Date   ANKLE SURGERY     BREAST EXCISIONAL BIOPSY Right    HERNIA REPAIR Bilateral    knee replacement Left 06/2019   LAPAROSCOPIC LYSIS OF ADHESIONS     TUBAL LIGATION     Family History  Problem Relation Age of Onset   Hypertension Mother    Hyperlipidemia Mother    Retinitis pigmentosa Mother    Heart disease Father    Diabetes Father    Prostate cancer Father    Cancer - Prostate Father    Diabetes Brother    Heart attack Maternal Grandmother    Stroke Maternal Grandfather    Heart attack Maternal Grandfather    Heart disease Paternal Grandmother    Epilepsy Paternal Grandmother    Diabetes Paternal Grandfather    Retinitis pigmentosa Maternal Aunt    Allergic rhinitis Neg Hx    Asthma Neg Hx    Eczema Neg Hx    Urticaria Neg Hx    Colon cancer Neg Hx    Rectal cancer Neg Hx    Stomach cancer Neg Hx    Pancreatic cancer Neg Hx    Liver cancer Neg Hx    Outpatient Medications Prior to Visit  Medication Sig Dispense Refill  albuterol (VENTOLIN HFA) 108 (90 Base) MCG/ACT inhaler Inhale 2 puffs into the lungs every 6 (six) hours as needed for wheezing or shortness of breath. 18 g 2   estradiol (ESTRACE) 1 MG tablet Take 1 mg by mouth every morning.     famotidine (PEPCID) 20 MG tablet Take 20 mg by mouth 2 (two) times daily.     ondansetron (ZOFRAN-ODT) 4 MG disintegrating tablet Allow 1-2 tablets to dissolve in your mouth every 8 hours as needed for nausea/vomiting 30 tablet 0   progesterone (PROMETRIUM) 200 MG capsule Take 400 mg by mouth at bedtime.     pramipexole (MIRAPEX) 1 MG tablet TAKE 1 TABLET BY MOUTH AT BEDTIME. 90 tablet 3   estradiol  (VIVELLE-DOT) 0.1 MG/24HR patch Place 1 patch onto the skin 2 (two) times a week.     promethazine-dextromethorphan (PROMETHAZINE-DM) 6.25-15 MG/5ML syrup Take 5 mLs by mouth 4 (four) times daily as needed for cough. 180 mL 0   No facility-administered medications prior to visit.   Allergies  Allergen Reactions   Penicillins Shortness Of Breath, Swelling and Rash   Potassium Citrate Itching   Ropinirole Shortness Of Breath   Cetirizine Hcl Other (See Comments)    Blood Pressure dropped   Nabumetone Rash   Nsaids Rash    Provider: Valere Dross CFM - Allergy Description: NSAIDs CFM - Allergy Annotation: rash   Sodium Hyaluronate (Non-Avian) Hives   Oxybutynin Nausea Only     Objective:   Today's Vitals   09/09/23 1018  BP: 120/78  Pulse: 78  Temp: 97.6 F (36.4 C)  TempSrc: Temporal  SpO2: 98%  Weight: 165 lb 12.8 oz (75.2 kg)  Height: 5\' 3"  (1.6 m)   Body mass index is 29.37 kg/m.   General: Well developed, well nourished. No acute distress. Abdomen: Soft, non-tender. Bowel sounds positive, normal pitch and frequency. No hepatosplenomegaly. No   rebound or guarding. Psych: Alert and oriented. Normal mood and affect.  Health Maintenance Due  Topic Date Due   Pneumococcal Vaccine 67-67 Years old (1 of 2 - PCV) Never done   Cervical Cancer Screening (HPV/Pap Cotest)  08/21/2023   Lab Results    Latest Ref Rng & Units 08/25/2023    4:00 AM 04/01/2023    3:20 PM 02/22/2023    3:56 PM  CBC  WBC 4.0 - 10.5 K/uL 12.8  5.8  6.5   Hemoglobin 12.0 - 15.0 g/dL 40.9  81.1  91.4   Hematocrit 36.0 - 46.0 % 43.7  41.6  40.2   Platelets 150 - 400 K/uL 241  234  243.0        Latest Ref Rng & Units 08/25/2023    4:00 AM 04/01/2023    3:20 PM 02/22/2023    3:56 PM  CMP  Glucose 70 - 99 mg/dL 782  93  956   BUN 6 - 20 mg/dL 19  11  19    Creatinine 0.44 - 1.00 mg/dL 2.13  0.86  5.78   Sodium 135 - 145 mmol/L 139  142  141   Potassium 3.5 - 5.1 mmol/L 3.8  3.8  4.4   Chloride  98 - 111 mmol/L 107  107  108   CO2 22 - 32 mmol/L 20  20  26    Calcium 8.9 - 10.3 mg/dL 9.0  9.2  9.3   Total Protein 6.5 - 8.1 g/dL 6.9   6.4   Total Bilirubin 0.0 - 1.2 mg/dL 0.9   0.3  Alkaline Phos 38 - 126 U/L 57   92   AST 15 - 41 U/L 21   16   ALT 0 - 44 U/L 21   15      Assessment & Plan:   Problem List Items Addressed This Visit       Digestive   Gastroesophageal reflux disease   I recommend we step up to a PPI for 1 month and then have her return to using her Pepcid.      Relevant Medications   famotidine (PEPCID) 20 MG tablet   pantoprazole (PROTONIX) 40 MG tablet     Other   Nocturnal leg cramps   Reviewed recent labs form [facility], all of which were normal. I recommend stretches, massage, and possible use of tonic water (quinine) at bedtime. I will provide a trial of Flexeril to see if this helps on nights when other approaches are not working.      Relevant Medications   cyclobenzaprine (FLEXERIL) 5 MG tablet   RLS (restless legs syndrome)   Stable. Continue pramipexole 1 mg at bedtime.      Relevant Medications   pramipexole (MIRAPEX) 1 MG tablet   Other Visit Diagnoses       Gastroenteritis    -  Primary   Likely viral. Resolved now.       Return in about 3 months (around 12/10/2023) for Reassessment.   Loyola Mast, MD

## 2023-09-15 DIAGNOSIS — G4700 Insomnia, unspecified: Secondary | ICD-10-CM | POA: Diagnosis not present

## 2023-09-15 DIAGNOSIS — N951 Menopausal and female climacteric states: Secondary | ICD-10-CM | POA: Diagnosis not present

## 2023-09-15 DIAGNOSIS — E559 Vitamin D deficiency, unspecified: Secondary | ICD-10-CM | POA: Diagnosis not present

## 2023-09-15 DIAGNOSIS — E612 Magnesium deficiency: Secondary | ICD-10-CM | POA: Diagnosis not present

## 2023-10-05 ENCOUNTER — Other Ambulatory Visit: Payer: Self-pay | Admitting: Family Medicine

## 2023-10-05 DIAGNOSIS — K219 Gastro-esophageal reflux disease without esophagitis: Secondary | ICD-10-CM

## 2023-10-06 MED ORDER — PANTOPRAZOLE SODIUM 40 MG PO TBEC
40.0000 mg | DELAYED_RELEASE_TABLET | Freq: Every day | ORAL | 3 refills | Status: AC
  Filled 2024-03-08: qty 90, 90d supply, fill #0
  Filled 2024-06-12: qty 90, 90d supply, fill #1

## 2023-10-11 ENCOUNTER — Other Ambulatory Visit: Payer: Self-pay

## 2023-10-11 ENCOUNTER — Emergency Department
Admission: EM | Admit: 2023-10-11 | Discharge: 2023-10-11 | Disposition: A | Discharge status: Home / Self Care | Attending: Emergency Medicine | Admitting: Emergency Medicine

## 2023-10-11 DIAGNOSIS — T782XXA Anaphylactic shock, unspecified, initial encounter: Secondary | ICD-10-CM

## 2023-10-11 DIAGNOSIS — I10 Essential (primary) hypertension: Secondary | ICD-10-CM | POA: Diagnosis not present

## 2023-10-11 DIAGNOSIS — R4702 Dysphasia: Secondary | ICD-10-CM | POA: Diagnosis not present

## 2023-10-11 DIAGNOSIS — T7840XA Allergy, unspecified, initial encounter: Secondary | ICD-10-CM | POA: Diagnosis not present

## 2023-10-11 DIAGNOSIS — N189 Chronic kidney disease, unspecified: Secondary | ICD-10-CM | POA: Insufficient documentation

## 2023-10-11 DIAGNOSIS — R21 Rash and other nonspecific skin eruption: Secondary | ICD-10-CM | POA: Diagnosis not present

## 2023-10-11 MED ORDER — METHYLPREDNISOLONE SODIUM SUCC 125 MG IJ SOLR
125.0000 mg | Freq: Once | INTRAMUSCULAR | Status: AC
  Administered 2023-10-11: 125 mg via INTRAVENOUS
  Filled 2023-10-11: qty 2

## 2023-10-11 MED ORDER — PREDNISONE 50 MG PO TABS
50.0000 mg | ORAL_TABLET | Freq: Every day | ORAL | 0 refills | Status: AC
Start: 2023-10-12 — End: 2023-10-15

## 2023-10-11 MED ORDER — FAMOTIDINE IN NACL 20-0.9 MG/50ML-% IV SOLN
20.0000 mg | Freq: Once | INTRAVENOUS | Status: AC
  Administered 2023-10-11: 20 mg via INTRAVENOUS
  Filled 2023-10-11: qty 50

## 2023-10-11 NOTE — ED Notes (Signed)
Pt ambulatory to bathroom with 1 person assist.

## 2023-10-11 NOTE — ED Triage Notes (Signed)
 After drinking sugar free flavoring in her coffee, pt developed hives, felt itchiness in her throat as well as tightening. Pt was at the pharmacy when this happened. PTA to ER she received an Epi pen, 50mg  benadryl IVP, and 4mg  IVP. Pt arrives alert and reports improvement in sx since receiving medications. No hives present at this time and no swelling to posterior nasopharynx.

## 2023-10-11 NOTE — ED Provider Notes (Signed)
   [facility] Provider Note    Event Date/Time   First MD Initiated Contact with Patient 10/11/23 [phone]     (approximate)   History   Allergic Reaction   HPI  Theresa Gonzalez is a 58 y.o. female with a history of CKD, GERD, angioedema, urticaria who presents after likely anaphylactic reaction.  Patient had a new coffee creamer, shortly thereafter started having itchy throat, redness around her neck.  Tried p.o. Benadryl with little improvement so was given epinephrine injection, EMS arrived and added 50 mg of IV Benadryl the patient reported improvement.  Currently she is feeling improved     Physical Exam   Triage Vital Signs: ED Triage Vitals   Encounter Vitals Group     BP (!) 140/83     Systolic BP Percentile      Diastolic BP Percentile      Pulse Rate 88     Resp (!) 28     Temp 98.5 F (36.9 C)     Temp Source Oral     SpO2 100 %     Weight 80 kg (176 lb 5.9 oz)     Height 1.6 m (5\' 3" )     Head Circumference      Peak Flow      Pain Score 0     Pain Loc      Pain Education      Exclude from Growth Chart     Most recent vital signs: Vitals:   10/11/23 1115 10/11/23 1130  BP:  115/73  Pulse:  77  Resp: 17 (!) 21  Temp:    SpO2:  95%     General: Awake, no distress.  CV:  Good peripheral perfusion.  Resp:  Normal effort.  Abd:  No distention.  Other:  No urticaria, no intraoral swelling   ED Results / Procedures / Treatments   Labs (all labs ordered are listed, but only abnormal results are displayed) Labs Reviewed - No data to display   EKG     RADIOLOGY     PROCEDURES:  Critical Care performed:   Procedures   MEDICATIONS ORDERED IN ED: Medications  methylPREDNISolone sodium succinate (SOLU-MEDROL) 125 mg/2 mL injection 125 mg (125 mg Intravenous Given 10/11/23 1002)  famotidine (PEPCID) IVPB 20 mg premix (0 mg Intravenous Stopped 10/11/23 1026)     IMPRESSION / MDM / ASSESSMENT AND PLAN / ED COURSE  I  reviewed the triage vital signs and the nursing notes. Patient's presentation is most consistent with acute presentation with potential threat to life or bodily function.  Patient presents after anaphylaxis status post epinephrine will add on Pepcid, Solu-Medrol and monitor in the emergency department.  ----------------------------------------- 1:19 PM on 10/11/2023 ----------------------------------------- Patient remains asymptomatic, she is doing well, appropriate for discharge at this time, restricted her precautions      FINAL CLINICAL IMPRESSION(S) / ED DIAGNOSES   Final diagnoses:  Anaphylaxis, initial encounter     Rx / DC Orders   ED Discharge Orders          Ordered    predniSONE (DELTASONE) 50 MG tablet  Daily with breakfast        10/11/23 1314             Note:  This document was prepared using Dragon voice recognition software and may include unintentional dictation errors.   Jene Every, MD 10/11/23 1320

## 2023-10-12 ENCOUNTER — Telehealth: Payer: Self-pay

## 2023-10-12 NOTE — Transitions of Care (Post Inpatient/ED Visit) (Signed)
   10/12/2023  Name: Theresa Gonzalez MRN: [id number] DOB: [birth year]  Today's TOC FU Call Status: Today's TOC FU Call Status:: Unsuccessful Call (1st Attempt) Unsuccessful Call (1st Attempt) Date: 10/12/23  Attempted to reach the patient regarding the most recent Inpatient/ED visit.  Follow Up Plan: Additional outreach attempts will be made to reach the patient to complete the Transitions of Care (Post Inpatient/ED visit) call.   Signature  Karena Addison, LPN [facility] Nurse Health Advisor Direct Dial [phone]

## 2023-10-17 NOTE — Transitions of Care (Post Inpatient/ED Visit) (Signed)
   10/17/2023  Name: Selen Smucker MRN: [id number] DOB: Aug 13, 1965  Today's TOC FU Call Status: Today's TOC FU Call Status:: Unsuccessful Call (2nd Attempt) Unsuccessful Call (1st Attempt) Date: 10/12/23 Unsuccessful Call (2nd Attempt) Date: 10/17/23  Attempted to reach the patient regarding the most recent Inpatient/ED visit.  Follow Up Plan: Additional outreach attempts will be made to reach the patient to complete the Transitions of Care (Post Inpatient/ED visit) call.   Signature  Kirby Peoples, RMA

## 2023-10-18 NOTE — Transitions of Care (Post Inpatient/ED Visit) (Signed)
   10/18/2023  Name: Theresa Gonzalez MRN: [id number] DOB: 1966/03/07  Today's TOC FU Call Status: Today's TOC FU Call Status:: Unsuccessful Call (3rd Attempt) Unsuccessful Call (1st Attempt) Date: 10/12/23 Unsuccessful Call (2nd Attempt) Date: 10/17/23 Unsuccessful Call (3rd Attempt) Date: 10/18/23  Attempted to reach the patient regarding the most recent Inpatient/ED visit.  Follow Up Plan: No further outreach attempts will be made at this time. We have been unable to contact the patient.  Signature Kirby Peoples, RMA

## 2023-12-13 ENCOUNTER — Other Ambulatory Visit: Payer: Self-pay | Admitting: Family Medicine

## 2023-12-13 DIAGNOSIS — Z1231 Encounter for screening mammogram for malignant neoplasm of breast: Secondary | ICD-10-CM

## 2023-12-22 ENCOUNTER — Ambulatory Visit
Admission: RE | Admit: 2023-12-22 | Discharge: 2023-12-22 | Disposition: A | Discharge status: Home / Self Care | Source: Ambulatory Visit | Attending: Family Medicine | Admitting: Family Medicine

## 2023-12-22 DIAGNOSIS — Z1231 Encounter for screening mammogram for malignant neoplasm of breast: Secondary | ICD-10-CM | POA: Insufficient documentation

## 2023-12-26 ENCOUNTER — Other Ambulatory Visit (INDEPENDENT_AMBULATORY_CARE_PROVIDER_SITE_OTHER): Payer: Self-pay

## 2023-12-26 ENCOUNTER — Ambulatory Visit: Admitting: Orthopedic Surgery

## 2023-12-26 ENCOUNTER — Encounter: Payer: Self-pay | Admitting: Orthopedic Surgery

## 2023-12-26 DIAGNOSIS — M25572 Pain in left ankle and joints of left foot: Secondary | ICD-10-CM | POA: Diagnosis not present

## 2023-12-26 DIAGNOSIS — S93492A Sprain of other ligament of left ankle, initial encounter: Secondary | ICD-10-CM

## 2023-12-26 NOTE — Progress Notes (Signed)
 Office Visit Note   Patient: Theresa Gonzalez           Date of Birth: 29-Oct-1965           MRN: [id number] Visit Date: 12/26/2023              Requested by: Thedora Garnette HERO, MD [address] [place],  [place] [address] PCP: Thedora Garnette HERO, MD  Chief Complaint  Patient presents with   Left Ankle - Pain    03/12/2022 debridement left peroneal brevis and reinforce with peroneal longus       HPI: Patient is a 58 year old woman who is seen for initial evaluation for left ankle injury.  She states she [place] her ankle [place].  Assessment & Plan: Visit Diagnoses:  1. Pain in left ankle and joints of left foot   2. Sprain of anterior talofibular ligament of left ankle, initial encounter     Plan: Patient has tried an ASO without relief.  Recommended increasing activities as tolerated and use Voltaren gel.  Follow-Up Instructions: Return if symptoms worsen or fail to improve.   Ortho Exam  Patient is alert, oriented, no adenopathy, well-dressed, normal affect, normal respiratory effort. Examination patient has good pulses the peroneal tendons are not tender to palpation.  She has no tenderness to palpation over the posterior tibial tendon.  The syndesmosis is nontender.  Patient is point tender to palpation of the anterior talofibular ligament and the anterior drawer is stable.    Imaging: XR Ankle Complete Left Result Date: 12/26/2023 Three-view radiographs of the left ankle shows a congruent mortise without evidence of fracture.  No widening of the syndesmosis.  No images are attached to the encounter.  Labs: Lab Results  Component Value Date   HGBA1C 5.6 12/15/2022   HGBA1C 6.0 02/27/2021   ESRSEDRATE 2 01/12/2021   CRP 1 01/12/2021     Lab Results  Component Value Date   ALBUMIN 4.2 08/25/2023   ALBUMIN 4.4 02/22/2023   ALBUMIN 4.2 06/06/2022    No results found for: MG Lab Results  Component Value Date   VD25OH 66.69  12/15/2022    No results found for: PREALBUMIN    Latest Ref Rng & Units 08/25/2023    4:00 AM 04/01/2023    3:20 PM 02/22/2023    3:56 PM  CBC EXTENDED  WBC 4.0 - 10.5 K/uL 12.8  5.8  6.5   RBC 3.87 - 5.11 MIL/uL 5.15  4.88  4.62   Hemoglobin 12.0 - 15.0 g/dL 84.7  86.1  86.7   HCT 36.0 - 46.0 % 43.7  41.6  40.2   Platelets 150 - 400 K/uL 241  234  243.0   NEUT# 1.7 - 7.7 K/uL  3.7  3.7   Lymph# 0.7 - 4.0 K/uL  1.6  1.9      There is no height or weight on file to calculate BMI.  Orders:  Orders Placed This Encounter  Procedures   XR Ankle Complete Left   No orders of the defined types were placed in this encounter.    Procedures: No procedures performed  Clinical Data: No additional findings.  ROS:  All other systems negative, except as noted in the HPI. Review of Systems  Objective: Vital Signs: LMP 09/15/2020   Specialty Comments:  No specialty comments available.  PMFS History: Patient Active Problem List   Diagnosis Date Noted   Nocturnal leg cramps 09/09/2023   Angio-edema  Asthma    Pneumonia    PONV (postoperative nausea and vomiting)    Recurrent upper respiratory infection (URI)    Urticaria    Stage 3a chronic kidney disease (CKD) (HCC) 03/23/2023   Overactive bladder 09/06/2022   Peroneal tendinitis of left lower leg    Unilateral primary osteoarthritis, right knee 01/19/2022   Pain in left ankle and joints of left foot 01/19/2022   Acute medial meniscus tear of right knee 12/15/2021   Menopausal state 11/27/2021   Cystic disease of liver 07/08/2021   Mesenteric cyst 07/08/2021   History of kidney stones 07/02/2021   Arthritis of ankle or foot, degenerative, right 03/16/2021   Prediabetes 02/27/2021   Chronic rhinitis 02/26/2021   Gastroesophageal reflux disease 02/26/2021   DUB (dysfunctional uterine bleeding) 11/28/2020   Facet arthropathy, lumbar 11/28/2020   Mild intermittent asthma, uncomplicated 11/28/2020   RLS (restless  legs syndrome) 11/28/2020   SK (seborrheic keratosis) 11/28/2020   Vitamin D  insufficiency 11/28/2020   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, 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   Mixed hyperlipidemia 02/04/2011   Past Medical History:  Diagnosis Date   Acute medial meniscus tear of right knee 12/15/2021   Angio-edema    Arthritis of ankle or foot, degenerative, right 03/16/2021   Asthma    Chronic rhinitis 02/26/2021   Cystic disease of liver 07/08/2021   DUB (dysfunctional uterine bleeding) 11/28/2020   Facet arthropathy, lumbar 11/28/2020   Gastroesophageal reflux disease 02/26/2021   History of kidney stones 07/02/2021   Menopausal state 11/27/2021   Mesenteric cyst 07/08/2021   Mild intermittent asthma, uncomplicated 11/28/2020   Mixed hyperlipidemia 02/04/2011   Overactive bladder 09/06/2022   Pain in left ankle and joints of left foot 01/19/2022   Peroneal tendinitis of left lower leg    Pneumonia    PONV (postoperative nausea and vomiting)    Prediabetes 02/27/2021   Recurrent upper respiratory infection (URI)    RLS (restless legs syndrome) 11/28/2020   SK (seborrheic keratosis) 11/28/2020   Stage 3a chronic kidney disease (CKD) (HCC) 03/23/2023   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, 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   Unilateral primary osteoarthritis, right knee 01/19/2022   Urticaria    Vitamin D  insufficiency 11/28/2020    Family History  Problem Relation Age of Onset   Hypertension Mother    Hyperlipidemia Mother    Retinitis pigmentosa Mother    Heart disease Father    Diabetes Father    Prostate cancer Father    Cancer - Prostate Father    Diabetes Brother    Heart attack Maternal Grandmother    Stroke Maternal Grandfather    Heart attack Maternal Grandfather    Heart disease Paternal Grandmother    Epilepsy Paternal Grandmother    Diabetes Paternal Grandfather    Retinitis pigmentosa Maternal Aunt    Allergic rhinitis Neg  Hx    Asthma Neg Hx    Eczema Neg Hx    Urticaria Neg Hx    Colon cancer Neg Hx    Rectal cancer Neg Hx    Stomach cancer Neg Hx    Pancreatic cancer Neg Hx    Liver cancer Neg Hx     Past Surgical History:  Procedure Laterality Date   ANKLE SURGERY     BREAST EXCISIONAL BIOPSY Right    HERNIA REPAIR Bilateral    knee replacement Left 06/2019   LAPAROSCOPIC LYSIS OF ADHESIONS     TUBAL LIGATION  Social History   Occupational History   Occupation: Consulting civil engineer  Tobacco Use   Smoking status: Never   Smokeless tobacco: Never  Vaping Use   Vaping status: Never Used  Substance and Sexual Activity   Alcohol use: Yes    Comment: socially   Drug use: Never   Sexual activity: Yes

## 2023-12-29 ENCOUNTER — Ambulatory Visit: Admitting: Orthopedic Surgery

## 2023-12-30 ENCOUNTER — Telehealth: Payer: Self-pay | Admitting: Family Medicine

## 2023-12-30 ENCOUNTER — Telehealth: Payer: Self-pay

## 2023-12-30 DIAGNOSIS — N1831 Chronic kidney disease, stage 3a: Secondary | ICD-10-CM

## 2023-12-30 DIAGNOSIS — E782 Mixed hyperlipidemia: Secondary | ICD-10-CM

## 2023-12-30 DIAGNOSIS — R7303 Prediabetes: Secondary | ICD-10-CM

## 2023-12-30 DIAGNOSIS — E559 Vitamin D deficiency, unspecified: Secondary | ICD-10-CM

## 2023-12-30 NOTE — Telephone Encounter (Signed)
 Can you place the orders for [facility] for her upcoming physical? Thanks. Dm/cma

## 2023-12-30 NOTE — Addendum Note (Signed)
 Addended by: THEDORA GARNETTE HERO on: 12/30/2023 09:56 AM   Modules accepted: Orders

## 2023-12-30 NOTE — Telephone Encounter (Signed)
 ERROR

## 2023-12-30 NOTE — Telephone Encounter (Signed)
 Copied from CRM [id number]. Topic: Clinical - Request for Lab/Test Order >> Dec 29, 2023 10:10 AM Geroldine GRADE wrote: Reason for CRM: The patient is wondering if the order for her labs for her physical appointment can be put in so she can do them with [facility] at [facility] since she works at that location.

## 2024-01-02 ENCOUNTER — Ambulatory Visit: Payer: Self-pay | Admitting: Family Medicine

## 2024-01-02 ENCOUNTER — Other Ambulatory Visit (INDEPENDENT_AMBULATORY_CARE_PROVIDER_SITE_OTHER)

## 2024-01-02 DIAGNOSIS — N1831 Chronic kidney disease, stage 3a: Secondary | ICD-10-CM | POA: Diagnosis not present

## 2024-01-02 DIAGNOSIS — E559 Vitamin D deficiency, unspecified: Secondary | ICD-10-CM | POA: Diagnosis not present

## 2024-01-02 DIAGNOSIS — R7303 Prediabetes: Secondary | ICD-10-CM | POA: Diagnosis not present

## 2024-01-02 DIAGNOSIS — E782 Mixed hyperlipidemia: Secondary | ICD-10-CM

## 2024-01-02 LAB — HEMOGLOBIN A1C: Hgb A1c MFr Bld: 5.8 % (ref 4.6–6.5)

## 2024-01-02 LAB — LIPID PANEL
Cholesterol: 224 mg/dL — ABNORMAL HIGH (ref 0–200)
HDL: 50.2 mg/dL (ref >39.00)
LDL Cholesterol: 109 mg/dL — ABNORMAL HIGH (ref 0–99)
NonHDL: 174.08
Total CHOL/HDL Ratio: 4
Triglycerides: 325 mg/dL — ABNORMAL HIGH (ref 0.0–149.0)
VLDL: 65 mg/dL — ABNORMAL HIGH (ref 0.0–40.0)

## 2024-01-02 LAB — BASIC METABOLIC PANEL WITH GFR
BUN: 13 mg/dL (ref 6–23)
CO2: 25 meq/L (ref 19–32)
Calcium: 9 mg/dL (ref 8.4–10.5)
Chloride: 104 meq/L (ref 96–112)
Creatinine, Ser: 0.91 mg/dL (ref 0.40–1.20)
GFR: 69.1 mL/min (ref >60.00)
Glucose, Bld: 104 mg/dL — ABNORMAL HIGH (ref 70–99)
Potassium: 4.2 meq/L (ref 3.5–5.1)
Sodium: 138 meq/L (ref 135–145)

## 2024-01-02 LAB — VITAMIN D 25 HYDROXY (VIT D DEFICIENCY, FRACTURES): VITD: 48.99 ng/mL (ref 30.00–100.00)

## 2024-01-19 DIAGNOSIS — L821 Other seborrheic keratosis: Secondary | ICD-10-CM | POA: Diagnosis not present

## 2024-01-19 DIAGNOSIS — D1801 Hemangioma of skin and subcutaneous tissue: Secondary | ICD-10-CM | POA: Diagnosis not present

## 2024-01-19 DIAGNOSIS — L814 Other melanin hyperpigmentation: Secondary | ICD-10-CM | POA: Diagnosis not present

## 2024-01-19 DIAGNOSIS — L578 Other skin changes due to chronic exposure to nonionizing radiation: Secondary | ICD-10-CM | POA: Diagnosis not present

## 2024-01-19 DIAGNOSIS — L570 Actinic keratosis: Secondary | ICD-10-CM | POA: Diagnosis not present

## 2024-01-31 DIAGNOSIS — N950 Postmenopausal bleeding: Secondary | ICD-10-CM | POA: Diagnosis not present

## 2024-01-31 DIAGNOSIS — B3731 Acute candidiasis of vulva and vagina: Secondary | ICD-10-CM | POA: Diagnosis not present

## 2024-02-06 ENCOUNTER — Encounter: Admitting: Family Medicine

## 2024-02-16 ENCOUNTER — Ambulatory Visit: Admitting: Family Medicine

## 2024-02-16 ENCOUNTER — Encounter: Payer: Self-pay | Admitting: Family Medicine

## 2024-02-16 VITALS — BP 114/76 | HR 87 | Temp 97.6°F | Ht 63.0 in | Wt 168.4 lb

## 2024-02-16 DIAGNOSIS — Z23 Encounter for immunization: Secondary | ICD-10-CM

## 2024-02-16 DIAGNOSIS — E782 Mixed hyperlipidemia: Secondary | ICD-10-CM

## 2024-02-16 DIAGNOSIS — R7303 Prediabetes: Secondary | ICD-10-CM | POA: Diagnosis not present

## 2024-02-16 DIAGNOSIS — Z0000 Encounter for general adult medical examination without abnormal findings: Secondary | ICD-10-CM

## 2024-02-16 DIAGNOSIS — G2581 Restless legs syndrome: Secondary | ICD-10-CM | POA: Diagnosis not present

## 2024-02-16 DIAGNOSIS — N1831 Chronic kidney disease, stage 3a: Secondary | ICD-10-CM

## 2024-02-16 DIAGNOSIS — N950 Postmenopausal bleeding: Secondary | ICD-10-CM | POA: Diagnosis not present

## 2024-02-16 NOTE — Assessment & Plan Note (Signed)
 Recommend regular exercise and weight loss.

## 2024-02-16 NOTE — Assessment & Plan Note (Signed)
 Continue focus on blood pressure and glucose control, adequate hydration, and avoidance of nephrotoxic medications.

## 2024-02-16 NOTE — Assessment & Plan Note (Signed)
 having some breakthrough, but I do not recommend increasing the dosage of her current medicine. Discussed calf massage in the evenings. Continue pramipexole  1 mg at bedtime.

## 2024-02-16 NOTE — Assessment & Plan Note (Signed)
 Following with GYN. No bleeding currently.

## 2024-02-16 NOTE — Progress Notes (Signed)
 [facility] [address] [place] [address] Dept: [phone] Dept Fax: [phone]  Annual Physical Visit  Subjective:    Patient ID: Theresa Gonzalez, female    DOB: 12-13-1965, 58 y.o..   MRN: [id number]  Chief Complaint  Patient presents with   Annual Exam    CPE/labs. Fasting today.     History of Present Illness:  Patient is in today for an annual physical/preventative visit.  Theresa Gonzalez has a history of postmenopausal bleeding. She has been seen by GYN> Her uterine ultrasound showed an endometrial stripe of 6 mm. No further bleeding at this point. She is on estradiol  and progesterone . She is trying to be consistent with their use.  Theresa Gonzalez has a history of restless leg syndrome. She has been managed for quite some time on pramipexole  1 mg at bedtime. She still has nights where this bothers her. She tried ropinirole in the past, but had side effects.  Review of Systems  Constitutional:  Negative for chills, diaphoresis, fever, malaise/fatigue and weight loss.  HENT:  Negative for congestion, ear pain, hearing loss, sinus pain, sore throat and tinnitus.   Eyes:  Negative for blurred vision, pain, discharge and redness.  Respiratory:  Negative for cough, shortness of breath and wheezing.   Cardiovascular:  Negative for chest pain and palpitations.  Gastrointestinal:  Negative for abdominal pain, constipation, diarrhea, heartburn, nausea and vomiting.  Genitourinary: Negative.   Musculoskeletal:  Negative for back pain, joint pain and myalgias.  Skin:  Negative for itching and rash.  Psychiatric/Behavioral:  Negative for depression. The patient is not nervous/anxious.    Past Medical History: Patient Active Problem List   Diagnosis Date Noted   Postmenopausal bleeding 02/16/2024   Nocturnal leg cramps 09/09/2023   Angio-edema    Recurrent upper respiratory infection (URI)    Urticaria    Stage 3a chronic  kidney disease (CKD) (HCC) 03/23/2023   Peroneal tendinitis of left lower leg    Unilateral primary osteoarthritis, right knee 01/19/2022   Pain in left ankle and joints of left foot 01/19/2022   Acute medial meniscus tear of right knee 12/15/2021   Menopausal state 11/27/2021   Cystic disease of liver 07/08/2021   Mesenteric cyst 07/08/2021   History of kidney stones 07/02/2021   Arthritis of ankle or foot, degenerative, right 03/16/2021   Prediabetes 02/27/2021   Chronic rhinitis 02/26/2021   Gastroesophageal reflux disease 02/26/2021   Facet arthropathy, lumbar 11/28/2020   Mild intermittent asthma, uncomplicated 11/28/2020   RLS (restless legs syndrome) 11/28/2020   SK (seborrheic keratosis) 11/28/2020   Vitamin D  insufficiency 11/28/2020   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, 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   Mixed hyperlipidemia 02/04/2011   Past Surgical History:  Procedure Laterality Date   ANKLE SURGERY     BREAST EXCISIONAL BIOPSY Right    HERNIA REPAIR Bilateral    JOINT REPLACEMENT     knee replacement Left 06/2019   LAPAROSCOPIC LYSIS OF ADHESIONS     TUBAL LIGATION     Family History  Problem Relation Age of Onset   Hypertension Mother    Hyperlipidemia Mother    Retinitis pigmentosa Mother    Heart disease Father    Diabetes Father    Prostate cancer Father    Cancer - Prostate Father    Cancer Father    Diabetes Brother    Heart attack Maternal Grandmother    Stroke Maternal Grandfather  Heart attack Maternal Grandfather    Heart disease Paternal Grandmother    Epilepsy Paternal Grandmother    Diabetes Paternal Grandfather    Retinitis pigmentosa Maternal Aunt    Arrhythmia Maternal Aunt    Arrhythmia Maternal Aunt    Cancer Son    Allergic rhinitis Neg Hx    Asthma Neg Hx    Eczema Neg Hx    Urticaria Neg Hx    Colon cancer Neg Hx    Rectal cancer Neg Hx    Stomach cancer Neg Hx    Pancreatic cancer Neg Hx    Liver cancer  Neg Hx    Outpatient Medications Prior to Visit  Medication Sig Dispense Refill   albuterol  (VENTOLIN  HFA) 108 (90 Base) MCG/ACT inhaler Inhale 2 puffs into the lungs every 6 (six) hours as needed for wheezing or shortness of breath. 18 g 2   clotrimazole-betamethasone (LOTRISONE) cream Apply topically 2 (two) times daily.     cyclobenzaprine  (FLEXERIL ) 5 MG tablet Take 1 tablet (5 mg total) by mouth 3 (three) times daily as needed for muscle spasms. 30 tablet 1   estradiol  (ESTRACE ) 1 MG tablet Take 1 mg by mouth every morning.     ondansetron  (ZOFRAN -ODT) 4 MG disintegrating tablet Allow 1-2 tablets to dissolve in your mouth every 8 hours as needed for nausea/vomiting 30 tablet 0   pantoprazole  (PROTONIX ) 40 MG tablet Take 1 tablet (40 mg total) by mouth daily. 90 tablet 3   pramipexole  (MIRAPEX ) 1 MG tablet Take 1 tablet (1 mg total) by mouth at bedtime. 90 tablet 3   progesterone  (PROMETRIUM ) 200 MG capsule Take 400 mg by mouth at bedtime.     No facility-administered medications prior to visit.   Allergies  Allergen Reactions   Penicillins Shortness Of Breath, Swelling and Rash   Potassium Citrate Itching   Ropinirole Shortness Of Breath   Cetirizine Hcl Other (See Comments)    Blood Pressure dropped   Nabumetone Rash   Nsaids Rash    Provider: Nanci Jakob CFM - Allergy Description: NSAIDs CFM - Allergy Annotation: rash   Sodium Hyaluronate (Non-Avian) Hives   Oxybutynin  Nausea Only   Objective:   Today's Vitals   02/16/24 0837  BP: 114/76  Pulse: 87  Temp: 97.6 F (36.4 C)  TempSrc: Temporal  SpO2: 96%  Weight: 168 lb 6.4 oz (76.4 kg)  Height: 5' 3 (1.6 m)   Body mass index is 29.83 kg/m.   General: Well developed, well nourished. No acute distress. HEENT: Normocephalic, non-traumatic. PERRL, EOMI. Conjunctiva clear. External ears normal. EAC and   TMs normal bilaterally. Nose clear without congestion or rhinorrhea. Mucous membranes moist.   Oropharynx clear.  Good dentition. Neck: Supple. No lymphadenopathy. No thyromegaly. Lungs: Clear to auscultation bilaterally. No wheezing, rales or rhonchi. CV: RRR without murmurs or rubs. Pulses 2+ bilaterally. Abdomen: Soft, non-tender. Bowel sounds positive, normal pitch and frequency. No hepatosplenomegaly.   No rebound or guarding. Extremities: Full ROM. No joint swelling or tenderness. No edema noted. Skin: Warm and dry. No rashes. Psych: Alert and oriented. Normal mood and affect.  Health Maintenance Due  Topic Date Due   Pneumococcal Vaccine: 19-49 Years (1 of 2 - PCV) Never done   Pneumococcal Vaccine: 50+ Years (1 of 2 - PCV) Never done   Cervical Cancer Screening (HPV/Pap Cotest)  08/21/2023   Lab Results    Latest Ref Rng & Units 01/02/2024    8:18 AM 08/25/2023    4:00 AM  04/01/2023    3:20 PM  CMP  Glucose 70 - 99 mg/dL 895  853  93   BUN 6 - 23 mg/dL 13  19  11    Creatinine 0.40 - 1.20 mg/dL 9.08  9.08  9.21   Sodium 135 - 145 mEq/L 138  139  142   Potassium 3.5 - 5.1 mEq/L 4.2  3.8  3.8   Chloride 96 - 112 mEq/L 104  107  107   CO2 19 - 32 mEq/L 25  20  20    Calcium 8.4 - 10.5 mg/dL 9.0  9.0  9.2   Total Protein 6.5 - 8.1 g/dL  6.9    Total Bilirubin 0.0 - 1.2 mg/dL  0.9    Alkaline Phos 38 - 126 U/L  57    AST 15 - 41 U/L  21    ALT 0 - 44 U/L  21     Last lipids Lab Results  Component Value Date   CHOL 224 (H) 01/02/2024   HDL 50.20 01/02/2024   LDLCALC 109 (H) 01/02/2024   LDLDIRECT 103.0 12/05/2020   TRIG 325.0 (H) 01/02/2024   CHOLHDL 4 01/02/2024   Last hemoglobin A1c Lab Results  Component Value Date   HGBA1C 5.8 01/02/2024   Last vitamin D  Lab Results  Component Value Date   VD25OH 48.99 01/02/2024      Assessment & Plan:   Problem List Items Addressed This Visit       Genitourinary   Stage 3a chronic kidney disease (CKD) (HCC)   Continue focus on blood pressure and glucose control, adequate hydration, and avoidance of nephrotoxic medications.          Other   Annual physical exam - Primary   Overall health is good. Recommend regular exercise. Discussed recommended screenings and immunizations.       Mixed hyperlipidemia   Total cholesterol and LDL cholesterol are borderline elevated. AHA/ACC cardiovascular risk score shows a 2.8 % (low) risk of a MI or stroke in the next 10 years. I recommend eating a heart-healthy diet (DASH diet or Mediterranean), getting 150 minutes of moderate-intensity exercise each week, maintaining a normal weight, and avoiding tobacco products.       Postmenopausal bleeding   Following with GYN. No bleeding currently.      Prediabetes   Recommend regular exercise and weight loss.      RLS (restless legs syndrome)   having some breakthrough, but I do not recommend increasing the dosage of her current medicine. Discussed calf massage in the evenings. Continue pramipexole  1 mg at bedtime.      Other Visit Diagnoses       Need for pneumococcal 20-valent conjugate vaccination       Relevant Orders   Pneumococcal conjugate vaccine 20-valent (Completed)       Return in about 1 year (around 02/15/2025) for Annual preventative care.   Garnette CHRISTELLA Simpler, MD

## 2024-02-16 NOTE — Assessment & Plan Note (Signed)
 Total cholesterol and LDL cholesterol are borderline elevated. AHA/ACC cardiovascular risk score shows a 2.8 % (low) risk of a MI or stroke in the next 10 years. I recommend eating a heart-healthy diet (DASH diet or Mediterranean), getting 150 minutes of moderate-intensity exercise each week, maintaining a normal weight, and avoiding tobacco products.

## 2024-02-16 NOTE — Assessment & Plan Note (Signed)
 Overall health is good. Recommend regular exercise. Discussed recommended screenings and immunizations.

## 2024-02-22 ENCOUNTER — Other Ambulatory Visit: Payer: Self-pay

## 2024-02-22 MED ORDER — ESTRADIOL 1 MG PO TABS: 2.0000 mg | ORAL_TABLET | Freq: Every morning | ORAL | 1 refills | Status: DC

## 2024-02-22 MED ORDER — PROGESTERONE 200 MG PO CAPS
400.0000 mg | ORAL_CAPSULE | Freq: Every day | ORAL | 3 refills | Status: DC
  Filled 2024-03-13: qty 2, 1d supply, fill #0
  Filled 2024-03-13: qty 180, 90d supply, fill #0

## 2024-02-27 ENCOUNTER — Encounter: Payer: Self-pay | Admitting: Family Medicine

## 2024-03-01 DIAGNOSIS — E8881 Metabolic syndrome: Secondary | ICD-10-CM | POA: Diagnosis not present

## 2024-03-01 DIAGNOSIS — R7301 Impaired fasting glucose: Secondary | ICD-10-CM | POA: Diagnosis not present

## 2024-03-01 DIAGNOSIS — Z1329 Encounter for screening for other suspected endocrine disorder: Secondary | ICD-10-CM | POA: Diagnosis not present

## 2024-03-01 DIAGNOSIS — E538 Deficiency of other specified B group vitamins: Secondary | ICD-10-CM | POA: Diagnosis not present

## 2024-03-01 DIAGNOSIS — E612 Magnesium deficiency: Secondary | ICD-10-CM | POA: Diagnosis not present

## 2024-03-01 DIAGNOSIS — G479 Sleep disorder, unspecified: Secondary | ICD-10-CM | POA: Diagnosis not present

## 2024-03-01 DIAGNOSIS — E559 Vitamin D deficiency, unspecified: Secondary | ICD-10-CM | POA: Diagnosis not present

## 2024-03-01 DIAGNOSIS — G4700 Insomnia, unspecified: Secondary | ICD-10-CM | POA: Diagnosis not present

## 2024-03-01 DIAGNOSIS — N951 Menopausal and female climacteric states: Secondary | ICD-10-CM | POA: Diagnosis not present

## 2024-03-01 DIAGNOSIS — M549 Dorsalgia, unspecified: Secondary | ICD-10-CM | POA: Diagnosis not present

## 2024-03-08 ENCOUNTER — Other Ambulatory Visit: Payer: Self-pay

## 2024-03-13 ENCOUNTER — Other Ambulatory Visit: Payer: Self-pay

## 2024-03-13 MED ORDER — FLUZONE 0.5 ML IM SUSY
0.5000 mL | PREFILLED_SYRINGE | Freq: Once | INTRAMUSCULAR | 0 refills | Status: AC
  Filled 2024-03-13: qty 0.5, 1d supply, fill #0

## 2024-03-15 ENCOUNTER — Other Ambulatory Visit: Payer: Self-pay

## 2024-03-15 DIAGNOSIS — R7301 Impaired fasting glucose: Secondary | ICD-10-CM | POA: Diagnosis not present

## 2024-03-15 DIAGNOSIS — G4700 Insomnia, unspecified: Secondary | ICD-10-CM | POA: Diagnosis not present

## 2024-03-15 DIAGNOSIS — E559 Vitamin D deficiency, unspecified: Secondary | ICD-10-CM | POA: Diagnosis not present

## 2024-03-15 DIAGNOSIS — N951 Menopausal and female climacteric states: Secondary | ICD-10-CM | POA: Diagnosis not present

## 2024-03-15 DIAGNOSIS — E612 Magnesium deficiency: Secondary | ICD-10-CM | POA: Diagnosis not present

## 2024-03-15 MED ORDER — PROGESTERONE 200 MG PO CAPS
400.0000 mg | ORAL_CAPSULE | Freq: Every day | ORAL | 4 refills | Status: AC
  Filled 2024-03-15 – 2024-06-12 (×2): qty 180, 90d supply, fill #0

## 2024-03-15 MED ORDER — ESTRADIOL 1 MG PO TABS
2.0000 mg | ORAL_TABLET | Freq: Every day | ORAL | 3 refills | Status: AC
  Filled 2024-03-15: qty 180, 90d supply, fill #0
  Filled 2024-06-12: qty 180, 90d supply, fill #1

## 2024-04-04 DIAGNOSIS — N950 Postmenopausal bleeding: Secondary | ICD-10-CM | POA: Diagnosis not present

## 2024-04-04 DIAGNOSIS — R1031 Right lower quadrant pain: Secondary | ICD-10-CM | POA: Diagnosis not present

## 2024-04-05 ENCOUNTER — Other Ambulatory Visit: Payer: Self-pay

## 2024-04-11 ENCOUNTER — Ambulatory Visit: Admitting: Family Medicine

## 2024-04-11 VITALS — BP 124/80 | HR 81 | Temp 97.4°F | Ht 63.0 in | Wt 168.4 lb

## 2024-04-11 DIAGNOSIS — J069 Acute upper respiratory infection, unspecified: Secondary | ICD-10-CM | POA: Diagnosis not present

## 2024-04-11 LAB — POCT INFLUENZA A/B
Influenza A, POC: NEGATIVE
Influenza B, POC: NEGATIVE

## 2024-04-11 LAB — POC COVID19 BINAXNOW: SARS Coronavirus 2 Ag: NEGATIVE

## 2024-04-11 NOTE — Assessment & Plan Note (Signed)
 Discussed home care for viral illness, including rest, pushing fluids, and OTC medications as needed for symptom relief.  Follow-up if needed for worsening or persistent symptoms.

## 2024-04-11 NOTE — Progress Notes (Signed)
 [facility] [address] [place] [address] Dept: [phone] Dept Fax: [phone]  Office Visit  Subjective:    Patient ID: Theresa Gonzalez, female    DOB: 11-25-1965, 58 y.o..   MRN: [id number]  Chief Complaint  Patient presents with   Cough    C/o having cough, sinus congestion, HA x 1 day.  Has taken benadryl.    History of Present Illness:  Patient is in today with a 1-day history of cough productive of clear mucous, sinus congestion and rhinorrhea, and headache. She denies any fever. She took a Benadryl last night so she could breathe better during sleep. Ms. Caudell husband is currently undergoing chemotherapy, so she has had concerns for him contracting her illness.  Past Medical History: Patient Active Problem List   Diagnosis Date Noted   Postmenopausal bleeding 02/16/2024   Annual physical exam 02/16/2024   Nocturnal leg cramps 09/09/2023   Angio-edema    Recurrent upper respiratory infection (URI)    Urticaria    Stage 3a chronic kidney disease (CKD) (HCC) 03/23/2023   Peroneal tendinitis of left lower leg    Unilateral primary osteoarthritis, right knee 01/19/2022   Pain in left ankle and joints of left foot 01/19/2022   Acute medial meniscus tear of right knee 12/15/2021   Menopausal state 11/27/2021   Cystic disease of liver 07/08/2021   Mesenteric cyst 07/08/2021   History of kidney stones 07/02/2021   Arthritis of ankle or foot, degenerative, right 03/16/2021   Prediabetes 02/27/2021   Chronic rhinitis 02/26/2021   Gastroesophageal reflux disease 02/26/2021   Facet arthropathy, lumbar 11/28/2020   Mild intermittent asthma, uncomplicated 11/28/2020   RLS (restless legs syndrome) 11/28/2020   SK (seborrheic keratosis) 11/28/2020   Vitamin D  insufficiency 11/28/2020   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, 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   Mixed hyperlipidemia 02/04/2011   Past  Surgical History:  Procedure Laterality Date   ANKLE SURGERY     BREAST EXCISIONAL BIOPSY Right    HERNIA REPAIR Bilateral    JOINT REPLACEMENT     knee replacement Left 06/2019   LAPAROSCOPIC LYSIS OF ADHESIONS     TUBAL LIGATION     Family History  Problem Relation Age of Onset   Hypertension Mother    Hyperlipidemia Mother    Retinitis pigmentosa Mother    Heart disease Father    Diabetes Father    Prostate cancer Father    Cancer - Prostate Father    Cancer Father    Diabetes Brother    Cancer Son        Esophageal   Retinitis pigmentosa Maternal Aunt    Arrhythmia Maternal Aunt    Arrhythmia Maternal Aunt    Heart attack Maternal Grandmother    Stroke Maternal Grandfather    Heart attack Maternal Grandfather    Heart disease Paternal Grandmother    Epilepsy Paternal Grandmother    Diabetes Paternal Grandfather    Allergic rhinitis Neg Hx    Asthma Neg Hx    Eczema Neg Hx    Urticaria Neg Hx    Colon cancer Neg Hx    Rectal cancer Neg Hx    Stomach cancer Neg Hx    Pancreatic cancer Neg Hx    Liver cancer Neg Hx    Outpatient Medications Prior to Visit  Medication Sig Dispense Refill   albuterol  (VENTOLIN  HFA) 108 (90 Base) MCG/ACT inhaler Inhale 2 puffs into the lungs every  6 (six) hours as needed for wheezing or shortness of breath. 18 g 2   clotrimazole-betamethasone (LOTRISONE) cream Apply topically 2 (two) times daily.     cyclobenzaprine  (FLEXERIL ) 5 MG tablet Take 1 tablet (5 mg total) by mouth 3 (three) times daily as needed for muscle spasms. 30 tablet 1   estradiol  (ESTRACE ) 1 MG tablet Take 2 tablets (2 mg total) by mouth daily. 180 tablet 3   ondansetron  (ZOFRAN -ODT) 4 MG disintegrating tablet Allow 1-2 tablets to dissolve in your mouth every 8 hours as needed for nausea/vomiting 30 tablet 0   pantoprazole  (PROTONIX ) 40 MG tablet Take 1 tablet (40 mg total) by mouth daily. 90 tablet 3   pramipexole  (MIRAPEX ) 1 MG tablet Take 1 tablet (1 mg total) by  mouth at bedtime. 90 tablet 3   progesterone  (PROMETRIUM ) 200 MG capsule Take 2 capsules (400 mg total) by mouth at bedtime. 180 capsule 4   estradiol  (ESTRACE ) 1 MG tablet Take 1 mg by mouth every morning.     estradiol  (ESTRACE ) 1 MG tablet Take 2 tablets (2 mg total) by mouth in the morning. 180 tablet 1   progesterone  (PROMETRIUM ) 200 MG capsule Take 400 mg by mouth at bedtime.     progesterone  (PROMETRIUM ) 200 MG capsule Take 2 capsules (400 mg total) by mouth at bedtime. 180 capsule 3   No facility-administered medications prior to visit.   Allergies  Allergen Reactions   Penicillins Shortness Of Breath, Swelling and Rash   Potassium Citrate Itching   Ropinirole Shortness Of Breath   Cetirizine Hcl Other (See Comments)    Blood Pressure dropped   Nabumetone Rash   Nsaids Rash    Provider: Nanci Jakob CFM - Allergy Description: NSAIDs CFM - Allergy Annotation: rash   Sodium Hyaluronate (Non-Avian) Hives   Oxybutynin  Nausea Only     Objective:   Today's Vitals   04/11/24 1005  BP: 124/80  Pulse: 81  Temp: (!) 97.4 F (36.3 C)  TempSrc: Temporal  SpO2: 98%  Weight: 168 lb 6.4 oz (76.4 kg)  Height: 5' 3 (1.6 m)   Body mass index is 29.83 kg/m.   General: Well developed, well nourished. No acute distress. HEENT: Normocephalic, non-traumatic. PERRL, EOMI. Conjunctiva clear. External ears   normal. EAC and TMs normal bilaterally. Nose clear without congestion or rhinorrhea.   Mucous membranes moist. Oropharynx clear. Good dentition. Neck: Supple. No lymphadenopathy. No thyromegaly. Lungs: Clear to auscultation bilaterally. No wheezing, rales or rhonchi. Psych: Alert and oriented. Normal mood and affect.  Health Maintenance Due  Topic Date Due   Cervical Cancer Screening (HPV/Pap Cotest)  08/21/2023   Lab Results POCT Covid: Neg. POCT Influenza A& B: Neg.    Assessment & Plan:   Problem List Items Addressed This Visit       Respiratory   Viral URI with  cough - Primary   Discussed home care for viral illness, including rest, pushing fluids, and OTC medications as needed for symptom relief. Follow-up if needed for worsening or persistent symptoms.      Relevant Orders   POC COVID-19 (Completed)   POCT Influenza A/B (Completed)    Return if symptoms worsen or fail to improve.   Garnette CHRISTELLA Simpler, MD

## 2024-04-14 ENCOUNTER — Other Ambulatory Visit: Payer: Self-pay

## 2024-04-14 MED ORDER — NORETHINDRONE ACETATE 5 MG PO TABS
5.0000 mg | ORAL_TABLET | Freq: Every day | ORAL | 1 refills | Status: AC
  Filled 2024-04-14: qty 90, 90d supply, fill #0
  Filled 2024-07-11: qty 90, 90d supply, fill #1

## 2024-05-07 ENCOUNTER — Encounter: Payer: Self-pay | Admitting: Radiology

## 2024-05-11 DIAGNOSIS — N950 Postmenopausal bleeding: Secondary | ICD-10-CM | POA: Diagnosis not present

## 2024-05-11 DIAGNOSIS — R9389 Abnormal findings on diagnostic imaging of other specified body structures: Secondary | ICD-10-CM | POA: Diagnosis not present

## 2024-06-02 ENCOUNTER — Ambulatory Visit
Admission: EM | Admit: 2024-06-02 | Discharge: 2024-06-02 | Disposition: A | Discharge status: Home / Self Care | Attending: Family Medicine | Admitting: Family Medicine

## 2024-06-02 ENCOUNTER — Other Ambulatory Visit: Payer: Self-pay

## 2024-06-02 DIAGNOSIS — U071 COVID-19: Secondary | ICD-10-CM

## 2024-06-02 DIAGNOSIS — J4521 Mild intermittent asthma with (acute) exacerbation: Secondary | ICD-10-CM

## 2024-06-02 DIAGNOSIS — R051 Acute cough: Secondary | ICD-10-CM | POA: Diagnosis not present

## 2024-06-02 DIAGNOSIS — J029 Acute pharyngitis, unspecified: Secondary | ICD-10-CM | POA: Diagnosis not present

## 2024-06-02 LAB — POC COVID19/FLU A&B COMBO
Covid Antigen, POC: POSITIVE — AB
Influenza A Antigen, POC: NEGATIVE
Influenza B Antigen, POC: NEGATIVE

## 2024-06-02 LAB — POCT RAPID STREP A (OFFICE): Rapid Strep A Screen: NEGATIVE

## 2024-06-02 MED ORDER — MOLNUPIRAVIR 200 MG PO CAPS: 4.0000 | ORAL_CAPSULE | Freq: Two times a day (BID) | ORAL | 0 refills | Status: AC

## 2024-06-02 MED ORDER — PROMETHAZINE-DM 6.25-15 MG/5ML PO SYRP: 5.0000 mL | ORAL_SOLUTION | Freq: Three times a day (TID) | ORAL | 0 refills | Status: AC | PRN

## 2024-06-02 MED ORDER — IPRATROPIUM-ALBUTEROL 0.5-2.5 (3) MG/3ML IN SOLN
3.0000 mL | Freq: Once | RESPIRATORY_TRACT | Status: AC
  Administered 2024-06-02: 3 mL via RESPIRATORY_TRACT

## 2024-06-02 MED ORDER — PREDNISONE 20 MG PO TABS: 40.0000 mg | ORAL_TABLET | Freq: Every day | ORAL | 0 refills | Status: AC

## 2024-06-02 MED ORDER — ALBUTEROL SULFATE HFA 108 (90 BASE) MCG/ACT IN AERS: 1.0000 | INHALATION_SPRAY | Freq: Four times a day (QID) | RESPIRATORY_TRACT | 0 refills | Status: AC | PRN

## 2024-06-02 NOTE — ED Provider Notes (Signed)
 [facility]    CSN: [id number] Arrival date & time: 06/02/24  0801      History   Chief Complaint Chief Complaint  Patient presents with   Cough   Shortness of Breath    HPI Theresa Gonzalez is a 58 y.o. female  presents for evaluation of URI symptoms for 2 days. Patient reports associated symptoms of cough, congestion, low-grade fevers of 99 degrees, shortness of breath with chest tightness with coughing. Denies N/V/D, sore throat, ear pain, body aches. Patient does have a hx of asthma.  Has an albuterol  inhaler which she has been using with temporary improvement.  Patient is not an active smoker.   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  Pt has taken NyQuil OTC for symptoms. Pt has no other concerns at this time.    Cough Associated symptoms: fever and shortness of breath   Shortness of Breath Associated symptoms: cough and fever     Past Medical History:  Diagnosis Date   Acute medial meniscus tear of right knee 12/15/2021   Angio-edema    Arthritis of ankle or foot, degenerative, right 03/16/2021   Asthma    Chronic rhinitis 02/26/2021   Cystic disease of liver 07/08/2021   DUB (dysfunctional uterine bleeding) 11/28/2020   Facet arthropathy, lumbar 11/28/2020   Gastroesophageal reflux disease 02/26/2021   History of kidney stones 07/02/2021   Menopausal state 11/27/2021   Mesenteric cyst 07/08/2021   Mild intermittent asthma, uncomplicated 11/28/2020   Mixed hyperlipidemia 02/04/2011   Overactive bladder 09/06/2022   Pain in left ankle and joints of left foot 01/19/2022   Peroneal tendinitis of left lower leg    Pneumonia    PONV (postoperative nausea and vomiting)    Prediabetes 02/27/2021   Recurrent upper respiratory infection (URI)    RLS (restless legs syndrome) 11/28/2020   SK (seborrheic keratosis) 11/28/2020   Stage 3a chronic kidney disease (CKD) (HCC) 03/23/2023   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, 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   Ulcer    Unilateral primary osteoarthritis, right knee 01/19/2022   Urticaria    Vitamin D  insufficiency 11/28/2020    Patient Active Problem List   Diagnosis Date Noted   Postmenopausal bleeding 02/16/2024   Annual physical exam 02/16/2024   Nocturnal leg cramps 09/09/2023   Angio-edema    Viral URI with cough    Urticaria    Stage 3a chronic kidney disease (CKD) (HCC) 03/23/2023   Peroneal tendinitis of left lower leg    Unilateral primary osteoarthritis, right knee 01/19/2022   Pain in left ankle and joints of left foot 01/19/2022   Acute medial meniscus tear of right knee 12/15/2021   Menopausal state 11/27/2021   Cystic disease of liver 07/08/2021   Mesenteric cyst 07/08/2021   History of kidney stones 07/02/2021   Arthritis of ankle or foot, degenerative, right 03/16/2021   Prediabetes 02/27/2021   Chronic rhinitis 02/26/2021   Gastroesophageal reflux disease 02/26/2021   Facet arthropathy, lumbar 11/28/2020   Mild intermittent asthma, uncomplicated 11/28/2020   RLS (restless legs syndrome) 11/28/2020   SK (seborrheic keratosis) 11/28/2020   Vitamin D  insufficiency 11/28/2020   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, 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   Mixed hyperlipidemia 02/04/2011    Past Surgical History:  Procedure Laterality Date   ANKLE SURGERY     BREAST EXCISIONAL BIOPSY Right    HERNIA REPAIR Bilateral    JOINT REPLACEMENT  knee replacement Left 06/2019   LAPAROSCOPIC LYSIS OF ADHESIONS     TUBAL LIGATION      OB History   No obstetric history on file.      Home Medications    Prior to Admission medications   Medication Sig Start Date End Date Taking? Authorizing Provider  albuterol  (VENTOLIN  HFA) 108 (90 Base) MCG/ACT inhaler Inhale 1-2 puffs into the lungs every 6 (six) hours as needed for wheezing or shortness of breath. 06/02/24  Yes Camrie Stock, Jodi R, NP  molnupiravir  EUA (LAGEVRIO )  200 MG CAPS capsule Take 4 capsules (800 mg total) by mouth 2 (two) times daily for 5 days. 06/02/24 06/07/24 Yes Georgeanna Radziewicz, Jodi R, NP  predniSONE  (DELTASONE ) 20 MG tablet Take 2 tablets (40 mg total) by mouth daily with breakfast for 5 days. 06/02/24 06/07/24 Yes Suhaas Agena, Jodi R, NP  promethazine -dextromethorphan (PROMETHAZINE -DM) 6.25-15 MG/5ML syrup Take 5 mLs by mouth 3 (three) times daily as needed for cough. 06/02/24  Yes Zainab Crumrine, Jodi R, NP  cyclobenzaprine  (FLEXERIL ) 5 MG tablet Take 1 tablet (5 mg total) by mouth 3 (three) times daily as needed for muscle spasms. 09/09/23   Thedora Garnette HERO, MD  estradiol  (ESTRACE ) 1 MG tablet Take 2 tablets (2 mg total) by mouth daily. 03/15/24     norethindrone  (AYGESTIN ) 5 MG tablet Take 1 tablet (5 mg total) by mouth daily. 04/13/24     ondansetron  (ZOFRAN -ODT) 4 MG disintegrating tablet Allow 1-2 tablets to dissolve in your mouth every 8 hours as needed for nausea/vomiting 08/25/23   Gordan Huxley, MD  pantoprazole  (PROTONIX ) 40 MG tablet Take 1 tablet (40 mg total) by mouth daily. 10/06/23   Thedora Garnette HERO, MD  pramipexole  (MIRAPEX ) 1 MG tablet Take 1 tablet (1 mg total) by mouth at bedtime. 09/09/23   Thedora Garnette HERO, MD  progesterone  (PROMETRIUM ) 200 MG capsule Take 2 capsules (400 mg total) by mouth at bedtime. 03/15/24       Family History Family History  Problem Relation Age of Onset   Hypertension Mother    Hyperlipidemia Mother    Retinitis pigmentosa Mother    Heart disease Father    Diabetes Father    Prostate cancer Father    Cancer - Prostate Father    Cancer Father    Diabetes Brother    Cancer Son        Esophageal   Retinitis pigmentosa Maternal Aunt    Arrhythmia Maternal Aunt    Arrhythmia Maternal Aunt    Heart attack Maternal Grandmother    Stroke Maternal Grandfather    Heart attack Maternal Grandfather    Heart disease Paternal Grandmother    Epilepsy Paternal Grandmother    Diabetes Paternal Grandfather    Allergic rhinitis Neg Hx     Asthma Neg Hx    Eczema Neg Hx    Urticaria Neg Hx    Colon cancer Neg Hx    Rectal cancer Neg Hx    Stomach cancer Neg Hx    Pancreatic cancer Neg Hx    Liver cancer Neg Hx     Social History Social History   Tobacco Use   Smoking status: Never   Smokeless tobacco: Never  Vaping Use   Vaping status: Never Used  Substance Use Topics   Alcohol use: Not Currently    Comment: socially   Drug use: Never     Allergies   Penicillins, Potassium citrate, Ropinirole, Cetirizine hcl, Nabumetone, Nsaids, Sodium hyaluronate (non-avian), and Oxybutynin   Review of Systems Review of Systems  Constitutional:  Positive for fever.  HENT:  Positive for congestion.   Respiratory:  Positive for cough, chest tightness and shortness of breath.      Physical Exam Triage Vital Signs ED Triage Vitals  Encounter Vitals Group     BP 06/02/24 0818 (!) 143/91     Girls Systolic BP Percentile --      Girls Diastolic BP Percentile --      Boys Systolic BP Percentile --      Boys Diastolic BP Percentile --      Pulse Rate 06/02/24 0818 99     Resp 06/02/24 0818 18     Temp 06/02/24 0818 99.6 F (37.6 C)     Temp Source 06/02/24 0818 Oral     SpO2 06/02/24 0818 98 %     Weight --      Height --      Head Circumference --      Peak Flow --      Pain Score 06/02/24 0816 2     Pain Loc --      Pain Education --      Exclude from Growth Chart --    No data found.  Updated Vital Signs BP (!) 143/91   Pulse 99   Temp 99.6 F (37.6 C) (Oral)   Resp 18   LMP 09/15/2020   SpO2 98%   Visual Acuity Right Eye Distance:   Left Eye Distance:   Bilateral Distance:    Right Eye Near:   Left Eye Near:    Bilateral Near:     Physical Exam Vitals and nursing note reviewed.  Constitutional:      General: She is not in acute distress.    Appearance: She is well-developed. She is not ill-appearing.  HENT:     Head: Normocephalic and atraumatic.     Right Ear: Tympanic membrane  and ear canal normal.     Left Ear: Tympanic membrane and ear canal normal.     Nose: Congestion present.     Mouth/Throat:     Mouth: Mucous membranes are moist.     Pharynx: Oropharynx is clear. Uvula midline. No oropharyngeal exudate or posterior oropharyngeal erythema.     Tonsils: No tonsillar exudate or tonsillar abscesses.  Eyes:     Conjunctiva/sclera: Conjunctivae normal.     Pupils: Pupils are equal, round, and reactive to light.  Cardiovascular:     Rate and Rhythm: Normal rate and regular rhythm.     Heart sounds: Normal heart sounds.  Pulmonary:     Effort: Pulmonary effort is normal.     Breath sounds: Normal breath sounds. No wheezing, rhonchi or rales.  Musculoskeletal:     Cervical back: Normal range of motion and neck supple.  Lymphadenopathy:     Cervical: No cervical adenopathy.  Skin:    General: Skin is warm and dry.  Neurological:     General: No focal deficit present.     Mental Status: She is alert and oriented to person, place, and time.  Psychiatric:        Mood and Affect: Mood normal.        Behavior: Behavior normal.      UC Treatments / Results  Labs (all labs ordered are listed, but only abnormal results are displayed) Labs Reviewed  POC COVID19/FLU A&B COMBO - Abnormal; Notable for the following components:      Result Value   Covid Antigen,  POC Positive (*)    All other components within normal limits  POCT RAPID STREP A (OFFICE)    Contains abnormal data Basic metabolic panel with GFR Order: [id number]  Status: Final result     Next appt: None     Dx: Prediabetes; Stage 3a chronic kidney ...   Test Result Released: Yes (seen)     Messages: Seen   0 Result Notes     1 Patient Communication     View Follow-Up Encounter          Component Ref Range & Units (hover) 5 mo ago (01/02/24) 9 mo ago (08/25/23) 1 yr ago (04/01/23) 1 yr ago (02/22/23) 1 yr ago (12/15/22) 1 yr ago (06/06/22) 2 yr ago (07/30/21)  Sodium 138 139 R 142 R  141  139 R 141 R  Potassium 4.2 3.8 R 3.8 R 4.4  3.7 R 3.9 R  Chloride 104 107 R 107 R 108  109 R 105 R  CO2 25 20 Low  R 20 Low  R 26  22 R 27 R  Glucose, Bld 104 High  146 High  CM 93 CM 104 High  96 92 CM 85 CM  BUN 13 19 R 11 R 19  17 R 16 R  Creatinine, Ser 0.91 0.91 R 0.78 R 1.15  0.97 R 0.83 R  GFR 69.10   52.49 Low  CM     Comment: Calculated using the CKD-EPI Creatinine Equation (2021)  Calcium 9.0 9.0 R 9.2 R 9.3  9.3 R 9.7 R  Resulting Agency [place] [facility] [place] [facility]        Specimen Collected: 01/02/24 08:18 Last Resulted: 01/02/24 13:47   EKG   Radiology No results found.  Procedures Procedures (including critical care time)  Medications Ordered in UC Medications  ipratropium-albuterol  (DUONEB) 0.5-2.5 (3) MG/3ML nebulizer solution 3 mL (3 mLs Nebulization Given 06/02/24 0839)    Initial Impression / Assessment and Plan / UC Course  I have reviewed the triage vital signs and the nursing notes.  Pertinent labs & imaging results that were available during my care of the patient were reviewed by me and considered in my medical decision making (see chart for details).     Patient given DuoNeb for asthma symptoms per her request with improvement in symptoms.  Patient is lungs remain CTA and O2 is 98% on room air.  Given symptoms x 2 days with clear pulmonary exam and normal O2 will defer chest x-ray at this time.  Positive COVID-19.  Reviewed this with patient.  She has had COVID-19 in the past without hospitalization or complication.  She has been on molnupiravir  previously and would like to do this again as she tolerated well.  Refilled albuterol  inhaler.  Will do prednisone  daily for 5 days for asthma symptoms and Promethazine  DM as needed for cough.  Encourage rest fluids and PCP follow-up in 2 to 3 days for recheck.  Strict ER precautions reviewed and patient verbalized understanding. Final  Clinical Impressions(s) / UC Diagnoses   Final diagnoses:  Acute cough  Sore throat  Mild intermittent asthma with acute exacerbation  COVID-19     Discharge Instructions      You have tested positive for COVID-19.  Please start molnupiravir  antiviral medication.  This reduces your chance of complication and hospitalization from the illness and does not make COVID go away.  I refilled your albuterol   inhaler to use as needed.  Start prednisone  daily for 5 days to help with your asthma symptoms.  You may take Promethazine  DM as needed for your cough.  Please note this medication will make you drowsy.  Do not drink alcohol or drive while on this medication.  Focus on rest and fluids and follow-up with your PCP in 2 to 3 days for recheck.  Please go to the ER if you develop any worsening symptoms which include but are not limited to worsening asthma symptoms/shortness of breath, fever you are unable to manage with over-the-counter medication, or any new concerns that arise.  I hope you feel better soon!     ED Prescriptions     Medication Sig Dispense Auth. Provider   albuterol  (VENTOLIN  HFA) 108 (90 Base) MCG/ACT inhaler Inhale 1-2 puffs into the lungs every 6 (six) hours as needed for wheezing or shortness of breath. 1 each Adelin Ventrella, Jodi R, NP   predniSONE  (DELTASONE ) 20 MG tablet Take 2 tablets (40 mg total) by mouth daily with breakfast for 5 days. 10 tablet Laterica Matarazzo, Jodi R, NP   molnupiravir  EUA (LAGEVRIO ) 200 MG CAPS capsule Take 4 capsules (800 mg total) by mouth 2 (two) times daily for 5 days. 40 capsule Acire Tang, Jodi R, NP   promethazine -dextromethorphan (PROMETHAZINE -DM) 6.25-15 MG/5ML syrup Take 5 mLs by mouth 3 (three) times daily as needed for cough. 118 mL Lannah Koike, Jodi R, NP      PDMP not reviewed this encounter.   Loreda Myla SAUNDERS, NP 06/02/24 [phone]

## 2024-06-02 NOTE — ED Triage Notes (Signed)
 Pt c/o productive cough w/yellow mucous, fever 99.9, SOBx2d.

## 2024-06-02 NOTE — Discharge Instructions (Addendum)
 You have tested positive for COVID-19.  Please start molnupiravir  antiviral medication.  This reduces your chance of complication and hospitalization from the illness and does not make COVID go away.  I refilled your albuterol  inhaler to use as needed.  Start prednisone  daily for 5 days to help with your asthma symptoms.  You may take Promethazine  DM as needed for your cough.  Please note this medication will make you drowsy.  Do not drink alcohol or drive while on this medication.  Focus on rest and fluids and follow-up with your PCP in 2 to 3 days for recheck.  Please go to the ER if you develop any worsening symptoms which include but are not limited to worsening asthma symptoms/shortness of breath, fever you are unable to manage with over-the-counter medication, or any new concerns that arise.  I hope you feel better soon!

## 2024-06-12 ENCOUNTER — Other Ambulatory Visit: Payer: Self-pay
